# Patient Record
Sex: Male | Born: 1940 | Race: White | Hispanic: No | Smoking: Current every day smoker
Health system: Southern US, Community
[De-identification: ages and names within clinical notes are randomized; demographics above are authoritative.]

## PROBLEM LIST (undated history)

## (undated) DIAGNOSIS — I2510 Atherosclerotic heart disease of native coronary artery without angina pectoris: Secondary | ICD-10-CM

## (undated) DIAGNOSIS — I739 Peripheral vascular disease, unspecified: Secondary | ICD-10-CM

## (undated) DIAGNOSIS — I509 Heart failure, unspecified: Secondary | ICD-10-CM

## (undated) DIAGNOSIS — E785 Hyperlipidemia, unspecified: Secondary | ICD-10-CM

## (undated) DIAGNOSIS — J441 Chronic obstructive pulmonary disease with (acute) exacerbation: Secondary | ICD-10-CM

## (undated) DIAGNOSIS — F419 Anxiety disorder, unspecified: Secondary | ICD-10-CM

## (undated) DIAGNOSIS — I219 Acute myocardial infarction, unspecified: Secondary | ICD-10-CM

## (undated) DIAGNOSIS — N179 Acute kidney failure, unspecified: Secondary | ICD-10-CM

## (undated) DIAGNOSIS — D649 Anemia, unspecified: Secondary | ICD-10-CM

## (undated) DIAGNOSIS — Z720 Tobacco use: Secondary | ICD-10-CM

## (undated) DIAGNOSIS — I10 Essential (primary) hypertension: Secondary | ICD-10-CM

## (undated) DIAGNOSIS — J449 Chronic obstructive pulmonary disease, unspecified: Secondary | ICD-10-CM

## (undated) HISTORY — DX: Atherosclerotic heart disease of native coronary artery without angina pectoris: I25.10

## (undated) HISTORY — DX: Peripheral vascular disease, unspecified: I73.9

## (undated) HISTORY — PX: OTHER SURGICAL HISTORY: SHX169

## (undated) HISTORY — PX: CATARACT EXTRACTION: SUR2

## (undated) HISTORY — DX: Heart failure, unspecified: I50.9

## (undated) HISTORY — DX: Acute kidney failure, unspecified: N17.9

## (undated) HISTORY — PX: ILIAC ARTERY STENT: SHX1786

## (undated) HISTORY — PX: CHOLECYSTECTOMY: SHX55

## (undated) HISTORY — DX: Essential (primary) hypertension: I10

## (undated) HISTORY — PX: CORONARY ARTERY BYPASS GRAFT: SHX141

## (undated) HISTORY — DX: Hyperlipidemia, unspecified: E78.5

## (undated) HISTORY — DX: Acute myocardial infarction, unspecified: I21.9

## (undated) HISTORY — DX: Anxiety disorder, unspecified: F41.9

## (undated) HISTORY — DX: Anemia, unspecified: D64.9

## (undated) HISTORY — DX: Chronic obstructive pulmonary disease, unspecified: J44.9

## (undated) HISTORY — DX: Chronic obstructive pulmonary disease with (acute) exacerbation: J44.1

## (undated) HISTORY — DX: Tobacco use: Z72.0

---

## 1998-10-04 DIAGNOSIS — I219 Acute myocardial infarction, unspecified: Secondary | ICD-10-CM

## 1998-10-04 HISTORY — DX: Acute myocardial infarction, unspecified: I21.9

## 2008-10-04 HISTORY — PX: CARDIAC CATHETERIZATION: SHX172

## 2010-03-03 ENCOUNTER — Encounter: Payer: Self-pay | Admitting: Cardiovascular Disease

## 2010-03-04 HISTORY — PX: OTHER SURGICAL HISTORY: SHX169

## 2010-03-09 ENCOUNTER — Ambulatory Visit: Payer: Medicare Other | Admitting: Vascular Surgery

## 2010-03-25 ENCOUNTER — Ambulatory Visit: Payer: Medicare Other | Admitting: Pain Medicine

## 2010-06-11 ENCOUNTER — Ambulatory Visit: Payer: Medicare Other | Admitting: Internal Medicine

## 2010-06-11 ENCOUNTER — Encounter: Payer: Self-pay | Admitting: Cardiovascular Disease

## 2010-07-09 ENCOUNTER — Ambulatory Visit: Payer: Self-pay | Admitting: Cardiovascular Disease

## 2010-07-09 DIAGNOSIS — E785 Hyperlipidemia, unspecified: Secondary | ICD-10-CM | POA: Insufficient documentation

## 2010-07-09 DIAGNOSIS — R609 Edema, unspecified: Secondary | ICD-10-CM

## 2010-07-10 ENCOUNTER — Telehealth: Payer: Self-pay | Admitting: Cardiovascular Disease

## 2010-07-10 DIAGNOSIS — F17200 Nicotine dependence, unspecified, uncomplicated: Secondary | ICD-10-CM

## 2010-07-10 DIAGNOSIS — I25810 Atherosclerosis of coronary artery bypass graft(s) without angina pectoris: Secondary | ICD-10-CM | POA: Insufficient documentation

## 2010-07-10 DIAGNOSIS — I10 Essential (primary) hypertension: Secondary | ICD-10-CM | POA: Insufficient documentation

## 2010-08-20 ENCOUNTER — Telehealth: Payer: Self-pay | Admitting: Cardiovascular Disease

## 2010-11-03 NOTE — Progress Notes (Signed)
Summary: High BP  Phone Note Call from Patient Call back at [phone]   Caller: Daughter [facility]) Call For: [name] Summary of Call: BP 202/74-Pat is c/o SOB-#[phone] Initial call taken by: Harlon Flor,  August 20, 2010 3:40 PM  Follow-up for Phone Call        Called spoke with pt's daughter, she is on her way to pt's home.  He has only been taking 1/2 tablet of Metoprolol daily, advised daughter this is a two times a day, last dose was at 3am, therefore another dose should be taken to keep this medication consistantly in his system.  Pt does have hx of breathing    Additional Follow-up for Phone Call Additional follow up Details #1::        previous blood pressure has been fine. Would take meds as directed and monitor BP and call us back     Appended Document: High BP    Phone Note Call from Patient   Caller: Shaun pt's daughter Summary of Call: Daughter is calling concerned about pt's BP continuing to fluctuate-Would like for the pt to be seen today-I advised her that it was noted that the pt was only taking 1/2 tablet daily and that it should be a twice a day medication and that it could be a case of the medication not having taken effect yet-I stated that the nurse would call her back to assess the situation to determine if the pt needs to be seen today. Initial call taken by: Harlon Flor  Follow-up for Phone Call        Called spoke with pt's daughter she states pt's BP did respond to 1/2 tablet Metoprolol yesterday evening BP decr to 150/70's.  BP was 147/70 this am.  She reports pt's breathing has improved since BP has decr.  Advised to have pt continue Metoprolol 25mg  1/2 two times a day and Keep daily log of BP and HR in am and PM. Advised her TCB Monday with those readings, if consistantly elevated may need to further titrate meds. [name] agrees. Follow-up by: Cloyde Reams RN,  August 21, 2010 11:15 AM

## 2010-11-03 NOTE — Assessment & Plan Note (Signed)
Summary: NP6/AMD   Visit Type:  Initial Consult Primary Tayden Nichelson:  Dr. Darrick Huntsman  CC:  Establish care with a Cardiologist for CAD. c/o chest pain. He has a cough and shortness of breath..  History of Present Illness: 70 year old male with a history of coronary artery disease, MI in 2000 with bypass at that time, followup cardiac catheterization one to 2 years ago that was reportedly "okay" performed at [facility], diabetes, peripheral vascular disease with a history of intervention to his legs in June of 2000 less than with continued left leg pain, long history of smoking who continues to smoke one pack per day, history of GI bleeding in 2011 requiring packed red blood cell transfusion x4 per the patient, who presents for episodes of "knots" in his chest that cause pain over the past several months.  He describes his symptoms as lumps under his chest in between his ribs on the outside on the left that he needs to massage to make them go away. They seem to come and go. He is able to massage them out but they do seem to keep coming back. He has one nearer the left side of his sternum that has been there that has not gone away in quite some time. He denies any significant shortness of breath when he exerts himself. He is relatively active but limited by his chronic leg pain.  he has not been on Plavix because of a history of GI bleeding and only takes low-dose aspirin. He continues to smoke and has had difficulty trying to quit  Notes from March 09, 2010 details previous intervention to the bilateral common iliac arteries and external iliac artery with stent x2 to the left common iliac artery and external iliac artery.  EKG shows normal sinus rhythm with rate 89 beats per minute with nonspecific intraventricular conduction delay, unable to exclude old anterior infarct, left axis deviation/left anterior fascicular block. Nonspecific T waves in one and aVL  [l  Preventive Screening-Counseling &  Management  Alcohol-Tobacco     Smoking Status: never  Caffeine-Diet-Exercise     Does Patient Exercise: no  Current Medications (verified): 1)  Simvastatin 40 Mg Tabs (Simvastatin) .... Take One Tablet By Mouth Daily At Bedtime 2)  Driminate 50 Mg Tabs (Dimenhydrinate) .... One Tablet Once Daily 3)  Nexium 40 Mg Cpdr (Esomeprazole Magnesium) .... One Tablet Once Daily 4)  Furosemide 40 Mg Tabs (Furosemide) .... As Needed 5)  Gabapentin 300 Mg Caps (Gabapentin) .... One Tablet Three Times A Day 6)  Ventolin Hfa 108 (90 Base) Mcg/act Aers (Albuterol Sulfate) .... Inhalation Every 4-6 Hours As Needed 7)  Nitrostat 0.4 Mg Subl (Nitroglycerin) .... As Needed 8)  Paroxetine Hcl 30 Mg Tabs (Paroxetine Hcl) .... Two Tablets Once Daily 9)  Oxycodone-Acetaminophen 5-325 Mg Tabs (Oxycodone-Acetaminophen) .... One Tablet Every 6 Hours As Needed For Pain 10)  Aspir-Low 81 Mg Tbec (Aspirin) .... One Tablet Once Daily 11)  Ferrous Sulfate 325 (65 Fe) Mg Tabs (Ferrous Sulfate) .... One Tablet Once Daily 12)  Novolog Mix 70/30 70-30 % Susp (Insulin Aspart Prot & Aspart) 13)  Lantus 80 Units .... At Bedtime 14)  Centrum Silver  Tabs (Multiple Vitamins-Minerals) .... One Tablet Once Daily  Allergies (verified): 1)  ! Amoxicillin  Past History:  Family History: Last updated: 2010-08-08 Father: Deceased; MI Mother: Deceased; stroke  Social History: Last updated: August 08, 2010 Retired  Widowed  Tobacco Use - No. Smoker x 30 years. Smokes 1 PPD. Alcohol Use - no Regular  Exercise - no  Risk Factors: Exercise: no (07/09/2010)  Risk Factors: Smoking Status: never (07/09/2010)  Past Medical History: Hyperlipidemia Hypertension CAD PAD  Past Surgical History: CAD; CABG x 4 2000 @ [facility] PAD; stents in legs--[place] [facility] surgery  Family History: Father: Deceased; MI Mother: Deceased; stroke  Social History: Retired  Widowed  Tobacco Use - No. Smoker x 30 years.  Smokes 1 PPD. Alcohol Use - no Regular Exercise - no Smoking Status:  never Does Patient Exercise:  no  Review of Systems       The patient complains of chest pain and dyspnea on exertion.  The patient denies fever, vision loss, decreased hearing, hoarseness, syncope, peripheral edema, prolonged cough, abdominal pain, incontinence, muscle weakness, depression, enlarged lymph nodes, weight loss, and weight gain.    Vital Signs:  Patient profile:   70 year old male Height:      70 inches Weight:      175 pounds BMI:     25.20 Pulse rate:   99 / minute BP sitting:   120 / 74  (left arm) Cuff size:   regular  Vitals Entered By: Bishop Dublin, CMA (July 09, 2010 3:10 PM)  Physical Exam  General:  elderly gentleman that appears older than his stated age, in no apparent distress Head:  normocephalic and atraumatic Neck:  Neck supple, no JVD. No masses, thyromegaly or abnormal cervical nodes. Lungs:  moderately decreased breath sounds throughout bilaterally Heart:  Non-displaced PMI, chest non-tender; regular rate and rhythm, S1, S2 with I-II/VI SEM, rubs or gallops. Carotid upstroke normal, no bruit. Normal abdominal aortic size, no bruits.  Pedals normal pulses. Trace to 1+ edema, no varicosities. Abdomen:  Bowel sounds positive; abdomen soft and non-tender without masses Msk:  Back normal, normal gait. Muscle strength and tone normal. Pulses:  pulses normal in all 4 extremities Extremities:  No clubbing or cyanosis. Neurologic:  Alert and oriented x 3. Skin:  Intact without lesions or rashes. Psych:  Normal affect.   Impression & Recommendations:  Problem # 1:  CAD, AUTOLOGOUS BYPASS GRAFT (ICD-414.02) he has a history of coronary artery disease and bypass. His current symptoms seem somewhat atypical as he is able to massage the "knots" out of his left chest and they come on at rest and not with exertion.  No further testing is needed at this time given that his symptoms are  very atypical.  The following medications were removed from the medication list:    Plavix 75 Mg Tabs (Clopidogrel bisulfate) ..... One tablet once daily His updated medication list for this problem includes:    Nitrostat 0.4 Mg Subl (Nitroglycerin) .Marland Kitchen... As needed    Aspir-low 81 Mg Tbec (Aspirin) ..... One tablet once daily  Problem # 2:  TOBACCO ABUSE (ICD-305.1) We have strongly encouraged him to cut back on his smoking. He is not interested in any medical therapies for smoking cessation at this time.  Problem # 3:  HYPERTENSION, BENIGN (ICD-401.1) blood pressure appears well controlled at this time.  His updated medication list for this problem includes:    Furosemide 40 Mg Tabs (Furosemide) .Marland Kitchen... As needed    Aspir-low 81 Mg Tbec (Aspirin) ..... One tablet once daily  Problem # 4:  EDEMA (ICD-782.3) He does take Lasix daily for edema and for shortness of breath. I suggested that if he has worsening shortness of breath or worsening swelling in his ankles, he could take an extra Lasix in the afternoon.  Problem # 5:  HYPERLIPIDEMIA-MIXED (ICD-272.4) We will try to obtain his most recent lipid panel for our records.  His updated medication list for this problem includes:    Simvastatin 40 Mg Tabs (Simvastatin) .Marland Kitchen... Take one tablet by mouth daily at bedtime

## 2010-11-03 NOTE — Progress Notes (Signed)
Summary: heart rate  Phone Note Other Incoming   Caller: Patrick [name] Summary of Call: Patrick [name] has a heart rate of 120; yesterday was told to take  Metoprolol Tart 25 mg 1/2 tablet bid and is concerned. What to do about the heart rate being so high? His heart rate now is 103 and patient has not taken metoprolol yet.  The daughter will call the pharmacy today to see if  can get filled. Initial call taken by: Bishop Dublin, CMA,  July 10, 2010 2:46 PM  Follow-up for Phone Call        Told patient's daughter to be sure he is not having any symptoms with increased heart rate and to go ahead and start the metoprolol tart. 25 mg 1/2  two times a day as instructed yesterday by Dr. [name]. Follow-up by: Bishop Dublin, CMA,  July 10, 2010 2:54 PM    New/Updated Medications: METOPROLOL TARTRATE 25 MG TABS (METOPROLOL TARTRATE) Take 1/2  tablet by mouth two times a day Prescriptions: METOPROLOL TARTRATE 25 MG TABS (METOPROLOL TARTRATE) Take 1/2  tablet by mouth two times a day  #30 x 6   Entered by:   Bishop Dublin, CMA   Authorized by:   Patrick Arbour MD   Signed by:   Bishop Dublin, CMA on 07/10/2010   Method used:   Faxed to ...       [facility] (retail)       [address]       [place], [place]    [place]       Ph: [phone]       Fax: [phone]   RxID:   [id number]   Appended Document: heart rate If he has additional episodes of tachycardia, call us. May need a holter or event monitor. ventolin inhaler could also cause tachycardia...  Appended Document: heart rate Spoke to pt's daughter since starting metoprolol tachycardia has improved. BP 114/48 HR 71. Asked daughter to continue to monitor those BP's and HR's if he has anymore tachycardia to call office.

## 2010-11-13 ENCOUNTER — Ambulatory Visit: Payer: Medicare Other | Admitting: Internal Medicine

## 2011-01-11 ENCOUNTER — Ambulatory Visit: Payer: Medicare Other | Admitting: Vascular Surgery

## 2011-02-01 ENCOUNTER — Other Ambulatory Visit: Payer: Self-pay | Admitting: Emergency Medicine

## 2011-02-01 MED ORDER — METOPROLOL TARTRATE 25 MG PO TABS: ORAL_TABLET | ORAL | Status: DC

## 2011-03-18 ENCOUNTER — Ambulatory Visit: Payer: Self-pay | Admitting: Cardiovascular Disease

## 2011-05-28 ENCOUNTER — Other Ambulatory Visit: Payer: Self-pay | Admitting: Internal Medicine

## 2011-05-28 DIAGNOSIS — N529 Male erectile dysfunction, unspecified: Secondary | ICD-10-CM

## 2011-06-08 ENCOUNTER — Other Ambulatory Visit: Payer: Self-pay | Admitting: Internal Medicine

## 2011-06-08 DIAGNOSIS — F331 Major depressive disorder, recurrent, moderate: Secondary | ICD-10-CM

## 2011-06-28 ENCOUNTER — Telehealth: Payer: Self-pay | Admitting: Internal Medicine

## 2011-06-28 MED ORDER — OXYCODONE HCL 60 MG PO TB12: 1.0000 | ORAL_TABLET | Freq: Two times a day (BID) | ORAL | Status: DC | PRN

## 2011-06-28 MED ORDER — OXYCODONE-ACETAMINOPHEN 5-325 MG PO TABS: 1.0000 | ORAL_TABLET | Freq: Four times a day (QID) | ORAL | Status: DC | PRN

## 2011-06-28 NOTE — Telephone Encounter (Signed)
Patient received last refill on both medications on 05/31/11

## 2011-06-29 NOTE — Telephone Encounter (Signed)
Notified patients daughter Rx is ready to be picked up.

## 2011-07-30 ENCOUNTER — Encounter: Payer: Self-pay | Admitting: Internal Medicine

## 2011-07-30 ENCOUNTER — Ambulatory Visit (INDEPENDENT_AMBULATORY_CARE_PROVIDER_SITE_OTHER): Payer: Medicare Other | Admitting: Internal Medicine

## 2011-07-30 VITALS — BP 120/52 | HR 62 | Temp 97.4°F | Resp 16 | Ht 70.0 in | Wt 174.2 lb

## 2011-07-30 DIAGNOSIS — I10 Essential (primary) hypertension: Secondary | ICD-10-CM

## 2011-07-30 DIAGNOSIS — L0390 Cellulitis, unspecified: Secondary | ICD-10-CM

## 2011-07-30 DIAGNOSIS — F17200 Nicotine dependence, unspecified, uncomplicated: Secondary | ICD-10-CM

## 2011-07-30 DIAGNOSIS — E119 Type 2 diabetes mellitus without complications: Secondary | ICD-10-CM

## 2011-07-30 DIAGNOSIS — M25519 Pain in unspecified shoulder: Secondary | ICD-10-CM

## 2011-07-30 DIAGNOSIS — M25512 Pain in left shoulder: Secondary | ICD-10-CM

## 2011-07-30 DIAGNOSIS — E785 Hyperlipidemia, unspecified: Secondary | ICD-10-CM

## 2011-07-30 DIAGNOSIS — L0291 Cutaneous abscess, unspecified: Secondary | ICD-10-CM

## 2011-07-30 MED ORDER — OXYCODONE HCL 60 MG PO TB12: 1.0000 | ORAL_TABLET | Freq: Two times a day (BID) | ORAL | Status: DC | PRN

## 2011-07-30 MED ORDER — OXYCODONE-ACETAMINOPHEN 5-325 MG PO TABS: 1.0000 | ORAL_TABLET | Freq: Four times a day (QID) | ORAL | Status: DC | PRN

## 2011-07-30 MED ORDER — CEPHALEXIN 500 MG PO CAPS: 500.0000 mg | ORAL_CAPSULE | Freq: Three times a day (TID) | ORAL | Status: AC

## 2011-07-30 NOTE — Patient Instructions (Signed)
You have an appt with dr Hyacinth Meeker at [facility] on Monday at 11L00 to look at your shoulder  Ice the shoulder for 15 minutes every 4 or 5 hours  Wear the sling as much as possible.    Return on Monday for fasting bloodwork.
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
## 2011-08-01 ENCOUNTER — Encounter: Payer: Self-pay | Admitting: Internal Medicine

## 2011-08-01 DIAGNOSIS — F419 Anxiety disorder, unspecified: Secondary | ICD-10-CM | POA: Insufficient documentation

## 2011-08-01 DIAGNOSIS — M25512 Pain in left shoulder: Secondary | ICD-10-CM | POA: Insufficient documentation

## 2011-08-01 DIAGNOSIS — Z720 Tobacco use: Secondary | ICD-10-CM | POA: Insufficient documentation

## 2011-08-01 DIAGNOSIS — E1159 Type 2 diabetes mellitus with other circulatory complications: Secondary | ICD-10-CM | POA: Insufficient documentation

## 2011-08-01 DIAGNOSIS — I739 Peripheral vascular disease, unspecified: Secondary | ICD-10-CM | POA: Insufficient documentation

## 2011-08-01 NOTE — Assessment & Plan Note (Signed)
He continues to use 70/30 insulin twice daily but his sugars are not well controlled by report.  Will check hgba1c and ask patient (again) to submit biweekly logs of bs.

## 2011-08-01 NOTE — Assessment & Plan Note (Signed)
Well controlled on current medications.  No changes today. 

## 2011-08-01 NOTE — Assessment & Plan Note (Signed)
Acute, secondary to recent fall,  With painful ROM despite regular use of narcotics. I have put his arm in a sling and made an appt with [facility] to see Dr. Hyacinth Meeker on Monday for evaluation.  He is not a candidate for NSAIDs due to prior history of GI bleed.

## 2011-08-01 NOTE — Assessment & Plan Note (Signed)
Managed with simvastatin . Repeat lipids are  due  Goal is < 70.

## 2011-08-01 NOTE — Assessment & Plan Note (Signed)
He has been counselled on the risks of continued tobacco abuse given his history of CAD and PAD.  He is not interested in quitting at this time.

## 2011-08-05 ENCOUNTER — Other Ambulatory Visit: Payer: Self-pay | Admitting: Internal Medicine

## 2011-08-19 ENCOUNTER — Other Ambulatory Visit: Payer: Self-pay | Admitting: Internal Medicine

## 2011-08-26 ENCOUNTER — Other Ambulatory Visit: Payer: Self-pay | Admitting: Internal Medicine

## 2011-08-26 ENCOUNTER — Other Ambulatory Visit: Payer: Self-pay | Admitting: Cardiovascular Disease

## 2011-08-27 ENCOUNTER — Telehealth: Payer: Self-pay

## 2011-08-27 MED ORDER — METOPROLOL TARTRATE 25 MG PO TABS: ORAL_TABLET | ORAL | Status: DC

## 2011-08-27 NOTE — Telephone Encounter (Signed)
Refill sent for metoprolol.  

## 2011-10-05 HISTORY — PX: CATARACT EXTRACTION, BILATERAL: SHX1313

## 2011-10-15 ENCOUNTER — Other Ambulatory Visit: Payer: Self-pay | Admitting: Internal Medicine

## 2011-10-15 MED ORDER — OXYCODONE HCL 60 MG PO TB12: 1.0000 | ORAL_TABLET | Freq: Two times a day (BID) | ORAL | Status: DC | PRN

## 2011-10-15 MED ORDER — OXYCODONE-ACETAMINOPHEN 5-325 MG PO TABS: 1.0000 | ORAL_TABLET | Freq: Four times a day (QID) | ORAL | Status: DC | PRN

## 2011-10-15 NOTE — Telephone Encounter (Signed)
On printer. Can pick up Monday

## 2011-10-15 NOTE — Telephone Encounter (Signed)
Patient is needing refills on his Oxyontin 60 mg and Oxycodone 5-325 mg.

## 2011-10-18 NOTE — Telephone Encounter (Signed)
Left message that script is ready for pick up

## 2011-11-04 ENCOUNTER — Telehealth: Payer: Self-pay | Admitting: Internal Medicine

## 2011-11-04 MED ORDER — NITROGLYCERIN 0.4 MG SL SUBL: 0.4000 mg | SUBLINGUAL_TABLET | SUBLINGUAL | Status: DC | PRN

## 2011-11-04 NOTE — Telephone Encounter (Signed)
Refill done,. If he gets to the point where is is needing to use more than one to resolve pain, he needs to call 911

## 2011-11-04 NOTE — Telephone Encounter (Signed)
Spoke with pt's daughter, she said pt had some chest discomfort this morning but he has been ok since.  She is asking that he get a refill on ntg, previously prescribed by his cardiologist.  Uses [facility] [address]

## 2011-11-04 NOTE — Telephone Encounter (Signed)
[phone] Mr Patrick [name] called wanted to get a refill on nitroglycen pt is out of meds  Pt was having discomfort today [facility] [address]

## 2011-11-05 ENCOUNTER — Telehealth: Payer: Self-pay | Admitting: *Deleted

## 2011-11-05 NOTE — Telephone Encounter (Signed)
Advised pt

## 2011-11-05 NOTE — Telephone Encounter (Signed)
Form from [facility] for diabetic supplies is in red folder. I checked with the patient and he does want these supplies.

## 2011-11-08 NOTE — Telephone Encounter (Signed)
Form completed and faxed back

## 2011-11-16 ENCOUNTER — Encounter: Payer: Self-pay | Admitting: Internal Medicine

## 2011-11-16 ENCOUNTER — Ambulatory Visit (INDEPENDENT_AMBULATORY_CARE_PROVIDER_SITE_OTHER): Payer: Medicare Other | Admitting: Internal Medicine

## 2011-11-16 ENCOUNTER — Ambulatory Visit (INDEPENDENT_AMBULATORY_CARE_PROVIDER_SITE_OTHER)
Admission: RE | Admit: 2011-11-16 | Discharge: 2011-11-16 | Disposition: A | Discharge status: Home / Self Care | Payer: Medicare Other | Source: Ambulatory Visit | Attending: Internal Medicine | Admitting: Internal Medicine

## 2011-11-16 VITALS — BP 122/50 | HR 72 | Temp 98.2°F | Wt 172.0 lb

## 2011-11-16 DIAGNOSIS — E118 Type 2 diabetes mellitus with unspecified complications: Secondary | ICD-10-CM

## 2011-11-16 DIAGNOSIS — E1165 Type 2 diabetes mellitus with hyperglycemia: Secondary | ICD-10-CM

## 2011-11-16 DIAGNOSIS — M25559 Pain in unspecified hip: Secondary | ICD-10-CM

## 2011-11-16 DIAGNOSIS — L0291 Cutaneous abscess, unspecified: Secondary | ICD-10-CM

## 2011-11-16 DIAGNOSIS — R220 Localized swelling, mass and lump, head: Secondary | ICD-10-CM

## 2011-11-16 DIAGNOSIS — G9389 Other specified disorders of brain: Secondary | ICD-10-CM

## 2011-11-16 DIAGNOSIS — IMO0002 Reserved for concepts with insufficient information to code with codable children: Secondary | ICD-10-CM

## 2011-11-16 DIAGNOSIS — M25552 Pain in left hip: Secondary | ICD-10-CM

## 2011-11-16 DIAGNOSIS — E119 Type 2 diabetes mellitus without complications: Secondary | ICD-10-CM

## 2011-11-16 DIAGNOSIS — E785 Hyperlipidemia, unspecified: Secondary | ICD-10-CM

## 2011-11-16 DIAGNOSIS — L0390 Cellulitis, unspecified: Secondary | ICD-10-CM

## 2011-11-16 MED ORDER — OXYCODONE HCL 60 MG PO TB12: 1.0000 | ORAL_TABLET | Freq: Two times a day (BID) | ORAL | Status: DC | PRN

## 2011-11-16 MED ORDER — OXYCODONE-ACETAMINOPHEN 5-325 MG PO TABS: 1.0000 | ORAL_TABLET | Freq: Four times a day (QID) | ORAL | Status: DC | PRN

## 2011-11-16 NOTE — Progress Notes (Signed)
Subjective:    Patient ID: Patrick [name], male    DOB: 1941-02-15, 71 y.o.   MRN: [id number]  HPI  Patrick [name] is a 71 year old white male with a history of diabetes mellitus uncontrolled peripheral vascular disease with prior vascular interventions chronic neck and back pain and ongoing tobacco abuse who presents with several complaints today. He had  an episode of chest pain which lasted 2-3 minutes ,  Occurred while making coffee,  resolved spontaneously, with no subsequent episodes.  His symptomsdDid not last long enough to take ntg but his prescription expired anyway. He is scheduled to see his cardiologist Dr. [name] in one week.  His main complaint today is the development of severe hip pain radiating to the knee on the left which has been present for 3-4 weeks he has no history of prior fall or unusual activities. The pain is apparently constant even on while supine but is made worse by weightbearing .  He states his blood sugars have been consistently improved with most of them under 200. However he did not bring his log of blood sugars with him. He is overdue for an A1c.  Past Medical History  Diagnosis Date  . Hypertension   . Hyperlipidemia   . Coronary artery disease   . PAD (peripheral artery disease)   . Diabetes mellitus   . Anxiety   . Tobacco abuse   . Peripheral vascular disease     with prior stents placed bilaterally   Current Outpatient Prescriptions on File Prior to Visit  Medication Sig Dispense Refill  . dimenhyDRINATE (DRAMAMINE) 50 MG tablet Take 50 mg by mouth every 8 (eight) hours as needed.        Marland Kitchen esomeprazole (NEXIUM) 40 MG capsule Take 40 mg by mouth daily before breakfast.        . ferrous sulfate 325 (65 FE) MG EC tablet Take 325 mg by mouth daily with breakfast.        . furosemide (LASIX) 40 MG tablet TAKE 1 TABLET BY MOUTH EVERY DAY AS NEEDED  30 tablet  3  . gabapentin (NEURONTIN) 300 MG capsule Take 300 mg by mouth 3 (three) times daily.        . insulin  aspart protamine-insulin aspart (NOVOLOG 70/30) (70-30) 100 UNIT/ML injection Inject into the skin as directed.        . insulin glargine (LANTUS) 100 UNIT/ML injection Inject 40 Units into the skin at bedtime.       . metoprolol tartrate (LOPRESSOR) 25 MG tablet Take 1/2 tablet by mouth twice daily  30 tablet  6  . Multiple Vitamin (MULTIVITAMIN) tablet Take 1 tablet by mouth daily.        . nitroGLYCERIN (NITROSTAT) 0.4 MG SL tablet Place 1 tablet (0.4 mg total) under the tongue every 5 (five) minutes as needed.  30 tablet  6  . potassium chloride SA (K-DUR,KLOR-CON) 20 MEQ tablet TAKE 1 TABLET BY MOUTH EVERY DAY AS NEEDED  30 tablet  2  . PROAIR HFA 108 (90 BASE) MCG/ACT inhaler INHALE 1-2 PUFFS BY MOUTH EVERY 4-6 HOURS AS NEEDED  18 g  3  . simvastatin (ZOCOR) 40 MG tablet Take 40 mg by mouth at bedtime.        Marland Kitchen PARoxetine (PAXIL) 30 MG tablet TAKE 2 TABLETS BY MOUTH EVERY DAY  60 tablet  5     Review of Systems  Constitutional: Negative for fever, chills, diaphoresis, activity change, appetite change, fatigue and unexpected weight  change.  HENT: Negative for hearing loss, ear pain, nosebleeds, congestion, sore throat, facial swelling, rhinorrhea, sneezing, drooling, mouth sores, trouble swallowing, neck pain, neck stiffness, dental problem, voice change, postnasal drip, sinus pressure, tinnitus and ear discharge.   Eyes: Negative for photophobia, pain, discharge, redness, itching and visual disturbance.  Respiratory: Negative for apnea, cough, choking, chest tightness, shortness of breath, wheezing and stridor.   Cardiovascular: Negative for chest pain, palpitations and leg swelling.  Gastrointestinal: Negative for nausea, vomiting, abdominal pain, diarrhea, constipation, blood in stool, abdominal distention, anal bleeding and rectal pain.  Genitourinary: Negative for dysuria, urgency, frequency, hematuria, flank pain, decreased urine volume, scrotal swelling, difficulty urinating and  testicular pain.  Musculoskeletal: Positive for back pain, arthralgias and gait problem. Negative for myalgias and joint swelling.  Skin: Negative for color change, rash and wound.  Neurological: Negative for dizziness, tremors, seizures, syncope, speech difficulty, weakness, light-headedness, numbness and headaches.  Psychiatric/Behavioral: Negative for suicidal ideas, hallucinations, behavioral problems, confusion, sleep disturbance, dysphoric mood, decreased concentration and agitation. The patient is not nervous/anxious.        Objective:   Physical Exam  Constitutional: He is oriented to person, place, and time.  HENT:  Head: Normocephalic and atraumatic.  Mouth/Throat: Oropharynx is clear and moist.  Eyes: Conjunctivae and EOM are normal.  Neck: Normal range of motion. Neck supple. No JVD present. No thyromegaly present.  Cardiovascular: Normal rate, regular rhythm and normal heart sounds.   Pulmonary/Chest: Effort normal and breath sounds normal. He has no wheezes. He has no rales.  Abdominal: Soft. Bowel sounds are normal. He exhibits no mass. There is no tenderness. There is no rebound.  Musculoskeletal: Normal range of motion. He exhibits no edema.  Neurological: He is alert and oriented to person, place, and time.  Skin: Skin is warm and dry.  Psychiatric: He has a normal mood and affect.          Assessment & Plan:

## 2011-11-17 ENCOUNTER — Encounter: Payer: Self-pay | Admitting: Internal Medicine

## 2011-11-17 LAB — CBC WITH DIFFERENTIAL/PLATELET
Basophils Absolute: 0 10*3/uL (ref 0.0–0.1)
Basophils Relative: 0.5 % (ref 0.0–3.0)
Eosinophils Absolute: 0.3 10*3/uL (ref 0.0–0.7)
Hemoglobin: 9.7 g/dL — ABNORMAL LOW (ref 13.0–17.0)
Lymphocytes Relative: 26.1 % (ref 12.0–46.0)
MCHC: 31.8 g/dL (ref 30.0–36.0)
Monocytes Relative: 5.6 % (ref 3.0–12.0)
Neutro Abs: 5.8 10*3/uL (ref 1.4–7.7)
Neutrophils Relative %: 64.7 % (ref 43.0–77.0)
RBC: 3.75 Mil/uL — ABNORMAL LOW (ref 4.22–5.81)
RDW: 16.6 % — ABNORMAL HIGH (ref 11.5–14.6)

## 2011-11-17 LAB — COMPREHENSIVE METABOLIC PANEL
AST: 14 U/L (ref 0–37)
Albumin: 3.4 g/dL — ABNORMAL LOW (ref 3.5–5.2)
BUN: 36 mg/dL — ABNORMAL HIGH (ref 6–23)
CO2: 27 mEq/L (ref 19–32)
Calcium: 8.5 mg/dL (ref 8.4–10.5)
Chloride: 107 mEq/L (ref 96–112)
Creatinine, Ser: 1.5 mg/dL (ref 0.4–1.5)
GFR: 48.62 mL/min — ABNORMAL LOW (ref >60.00)
Potassium: 4.8 mEq/L (ref 3.5–5.1)

## 2011-11-17 LAB — HEMOGLOBIN A1C: Hgb A1c MFr Bld: 9.4 % — ABNORMAL HIGH (ref 4.6–6.5)

## 2011-11-17 LAB — LIPID PANEL
Cholesterol: 108 mg/dL (ref 0–200)
Triglycerides: 73 mg/dL (ref 0.0–149.0)

## 2011-11-17 NOTE — Assessment & Plan Note (Signed)
Found on MRI of brain done Feb 2012 for investigation of left arm numbness.  Patient was referred to [facility] for evaluation but declined to go.

## 2011-11-18 ENCOUNTER — Telehealth: Payer: Self-pay | Admitting: Internal Medicine

## 2011-11-18 DIAGNOSIS — M25552 Pain in left hip: Secondary | ICD-10-CM | POA: Insufficient documentation

## 2011-11-18 NOTE — Patient Instructions (Signed)
I am sendigg your for hip films to rule out loss of the hip joint.  We may need to get an MRI for your lower back.   Please submit a log of your blood sugars every couple of weeks along with your  Food diary.

## 2011-11-18 NOTE — Assessment & Plan Note (Signed)
His hip films were negative for loss of joint space or avascular necrosis. Etiology  may be lower back with radiculopathy to leg versus peripheral vascular disease worsening.  He states he had an MRI of the lumbar spine about 6 months ago but I am unable to find these records. He has good pulses in both feet and the wall and the leg appears well perfused sewed iliac stenosis is less likely. He would be considered high risk for lumbar surgery given his poorly controlled diabetes ongoing tobacco use and peripheral vascular disease her greatest pain is unrelenting and not improving with orthotics he may need to have intervention. Will obtain his prior MRI if this was done and referred to a vascular and neurosurgery.

## 2011-11-18 NOTE — Telephone Encounter (Signed)
Please review and advise.

## 2011-11-18 NOTE — Assessment & Plan Note (Addendum)
He has known peripheral vascular disease with prior interventions, and his hemoglobin A1c today is 9.4  Despite use of 70/30 insulin twice daily and Lantus for basal insulin is a very difficult historian and has been resistant to following a diabetic diet as well as to bring in log of his blood sugars. I  Am frustrated by mu inability to engender in him any real ownership of his disease.  I am referring him to Endocrinology to see if they can make any improvement in his glucose control.

## 2011-11-18 NOTE — Telephone Encounter (Signed)
[phone] PT DAUGHTER CALLED CHECKING ON XRAY THAT WAS DONE AT [facility] ON 11/16/11

## 2011-11-18 NOTE — Assessment & Plan Note (Signed)
His LDL and triglycerides are currently at goal on current statin dose. No changes today

## 2011-11-19 ENCOUNTER — Telehealth: Payer: Self-pay | Admitting: *Deleted

## 2011-11-19 DIAGNOSIS — M25559 Pain in unspecified hip: Secondary | ICD-10-CM

## 2011-11-19 NOTE — Telephone Encounter (Signed)
Advised pt and his daughter, Ines Bloomer, of pt's lab results and x-ray report. She says pt's last MRI was about a year ago.  She is asking if pt's hip pain could be caused by poor blood flow.  Also, he agrees to an endocrinologist referral.

## 2011-11-19 NOTE — Telephone Encounter (Signed)
Yes,  To blood flow. He has never had  a lumbar spine MRI done at [facility].  They need to tell me who ordered it and where it was done.  I will refer him to Vascular surgery for repeat evaluation

## 2011-11-19 NOTE — Telephone Encounter (Signed)
We have alrady contacted patient.  Hip filsm were fine.  Pain may be coming from lower back or from his circulation,.  He is supposed to be calling us back to tell us who and were he had an MRI ordered 6 months ago  beuse I don't have it.

## 2011-11-22 ENCOUNTER — Inpatient Hospital Stay: Payer: Self-pay | Admitting: Internal Medicine

## 2011-11-22 DIAGNOSIS — I059 Rheumatic mitral valve disease, unspecified: Secondary | ICD-10-CM

## 2011-11-22 LAB — COMPREHENSIVE METABOLIC PANEL
Albumin: 3.4 g/dL (ref 3.4–5.0)
Alkaline Phosphatase: 105 U/L (ref 50–136)
Anion Gap: 6 — ABNORMAL LOW (ref 7–16)
Bilirubin,Total: 0.3 mg/dL (ref 0.2–1.0)
Calcium, Total: 8.5 mg/dL (ref 8.5–10.1)
Co2: 27 mmol/L (ref 21–32)
EGFR (African American): 50 — ABNORMAL LOW
EGFR (Non-African Amer.): 41 — ABNORMAL LOW
Glucose: 206 mg/dL — ABNORMAL HIGH (ref 65–99)
Potassium: 4.1 mmol/L (ref 3.5–5.1)
SGOT(AST): 13 U/L — ABNORMAL LOW (ref 15–37)

## 2011-11-22 LAB — TROPONIN I: Troponin-I: <0.02 ng/mL

## 2011-11-22 LAB — CBC
MCH: 26 pg (ref 26.0–34.0)
MCV: 81 fL (ref 80–100)
Platelet: 316 10*3/uL (ref 150–440)
RDW: 16.7 % — ABNORMAL HIGH (ref 11.5–14.5)
WBC: 7 10*3/uL (ref 3.8–10.6)

## 2011-11-23 DIAGNOSIS — I5020 Unspecified systolic (congestive) heart failure: Secondary | ICD-10-CM

## 2011-11-23 LAB — LIPID PANEL
Cholesterol: 117 mg/dL (ref 0–200)
HDL Cholesterol: 29 mg/dL — ABNORMAL LOW (ref 40–60)
Triglycerides: 64 mg/dL (ref 0–200)
VLDL Cholesterol, Calc: 13 mg/dL (ref 5–40)

## 2011-11-23 LAB — BASIC METABOLIC PANEL
Anion Gap: 7 (ref 7–16)
Chloride: 105 mmol/L (ref 98–107)
Co2: 26 mmol/L (ref 21–32)
EGFR (African American): 57 — ABNORMAL LOW
EGFR (Non-African Amer.): 47 — ABNORMAL LOW
Osmolality: 294 (ref 275–301)
Potassium: 4.8 mmol/L (ref 3.5–5.1)
Sodium: 138 mmol/L (ref 136–145)

## 2011-11-23 LAB — CBC WITH DIFFERENTIAL/PLATELET
Basophil %: 0.2 %
Eosinophil #: 0 10*3/uL (ref 0.0–0.7)
Eosinophil %: 0 %
HCT: 29.4 % — ABNORMAL LOW (ref 40.0–52.0)
Lymphocyte #: 0.7 10*3/uL — ABNORMAL LOW (ref 1.0–3.6)
MCHC: 31.9 g/dL — ABNORMAL LOW (ref 32.0–36.0)
Monocyte %: 0.8 %
Neutrophil #: 5.5 10*3/uL (ref 1.4–6.5)
Neutrophil %: 87.6 %
Platelet: 287 10*3/uL (ref 150–440)
RBC: 3.61 10*6/uL — ABNORMAL LOW (ref 4.40–5.90)
RDW: 17.3 % — ABNORMAL HIGH (ref 11.5–14.5)
WBC: 6.2 10*3/uL (ref 3.8–10.6)

## 2011-11-23 LAB — RETICULOCYTES
Absolute Retic Count: 0.092 10*6/uL — ABNORMAL HIGH (ref 0.024–0.084)
Reticulocyte: 2.5 % — ABNORMAL HIGH (ref 0.5–1.5)

## 2011-11-23 LAB — IRON AND TIBC
Iron: 28 ug/dL — ABNORMAL LOW (ref 65–175)
Unbound Iron-Bind.Cap.: 310 ug/dL

## 2011-11-23 LAB — FERRITIN: Ferritin (ARMC): 38 ng/mL (ref 8–388)

## 2011-11-24 DIAGNOSIS — J9600 Acute respiratory failure, unspecified whether with hypoxia or hypercapnia: Secondary | ICD-10-CM

## 2011-11-24 LAB — BASIC METABOLIC PANEL
Anion Gap: 11 (ref 7–16)
Calcium, Total: 8.6 mg/dL (ref 8.5–10.1)
Co2: 25 mmol/L (ref 21–32)
EGFR (African American): 57 — ABNORMAL LOW
Osmolality: 297 (ref 275–301)

## 2011-11-24 NOTE — Telephone Encounter (Signed)
Spoke with patients daughter, pt is in hospital now but does agree to evaluation by vascular surgeon and endocrinologist.  She will call back when things settle down for referrals.

## 2011-11-25 ENCOUNTER — Other Ambulatory Visit: Payer: Self-pay | Admitting: Internal Medicine

## 2011-11-25 LAB — BASIC METABOLIC PANEL
Anion Gap: 7 (ref 7–16)
BUN: 62 mg/dL — ABNORMAL HIGH (ref 7–18)
Calcium, Total: 8.8 mg/dL (ref 8.5–10.1)
Chloride: 100 mmol/L (ref 98–107)
Co2: 29 mmol/L (ref 21–32)
Creatinine: 1.73 mg/dL — ABNORMAL HIGH (ref 0.60–1.30)
EGFR (African American): 51 — ABNORMAL LOW
Glucose: 361 mg/dL — ABNORMAL HIGH (ref 65–99)

## 2011-11-25 LAB — OCCULT BLOOD X 1 CARD TO LAB, STOOL: Occult Blood, Feces: POSITIVE

## 2011-11-26 ENCOUNTER — Other Ambulatory Visit: Payer: Medicare Other

## 2011-11-26 LAB — HEMOGLOBIN: HGB: 9.2 g/dL — ABNORMAL LOW (ref 13.0–18.0)

## 2011-11-26 LAB — BASIC METABOLIC PANEL
Anion Gap: 11 (ref 7–16)
BUN: 70 mg/dL — ABNORMAL HIGH (ref 7–18)
Calcium, Total: 8.3 mg/dL — ABNORMAL LOW (ref 8.5–10.1)
Co2: 28 mmol/L (ref 21–32)
Creatinine: 2.33 mg/dL — ABNORMAL HIGH (ref 0.60–1.30)
EGFR (African American): 36 — ABNORMAL LOW
EGFR (Non-African Amer.): 30 — ABNORMAL LOW
Glucose: 124 mg/dL — ABNORMAL HIGH (ref 65–99)
Osmolality: 307 (ref 275–301)
Sodium: 143 mmol/L (ref 136–145)

## 2011-11-27 LAB — BASIC METABOLIC PANEL
Anion Gap: 7 (ref 7–16)
BUN: 63 mg/dL — ABNORMAL HIGH (ref 7–18)
Calcium, Total: 8.4 mg/dL — ABNORMAL LOW (ref 8.5–10.1)
Chloride: 106 mmol/L (ref 98–107)
Co2: 29 mmol/L (ref 21–32)
Creatinine: 1.86 mg/dL — ABNORMAL HIGH (ref 0.60–1.30)
EGFR (African American): 47 — ABNORMAL LOW
EGFR (Non-African Amer.): 39 — ABNORMAL LOW
Glucose: 113 mg/dL — ABNORMAL HIGH (ref 65–99)
Osmolality: 302 (ref 275–301)
Potassium: 4.5 mmol/L (ref 3.5–5.1)
Sodium: 142 mmol/L (ref 136–145)

## 2011-11-27 LAB — CBC WITH DIFFERENTIAL/PLATELET
Basophil #: 0 10*3/uL (ref 0.0–0.1)
Basophil %: 0.1 %
Eosinophil #: 0.1 10*3/uL (ref 0.0–0.7)
Eosinophil %: 1.2 %
HCT: 27.4 % — ABNORMAL LOW (ref 40.0–52.0)
HGB: 8.7 g/dL — ABNORMAL LOW (ref 13.0–18.0)
Lymphocyte #: 1.5 10*3/uL (ref 1.0–3.6)
Lymphocyte %: 19.7 %
MCH: 25.8 pg — ABNORMAL LOW (ref 26.0–34.0)
MCHC: 31.7 g/dL — ABNORMAL LOW (ref 32.0–36.0)
MCV: 82 fL (ref 80–100)
Monocyte #: 0.6 10*3/uL (ref 0.0–0.7)
Monocyte %: 7.6 %
Neutrophil #: 5.5 10*3/uL (ref 1.4–6.5)
Neutrophil %: 71.4 %
Platelet: 214 10*3/uL (ref 150–440)
RBC: 3.36 10*6/uL — ABNORMAL LOW (ref 4.40–5.90)
RDW: 17.5 % — ABNORMAL HIGH (ref 11.5–14.5)
WBC: 7.7 10*3/uL (ref 3.8–10.6)

## 2011-11-28 LAB — BASIC METABOLIC PANEL
Anion Gap: 10 (ref 7–16)
Chloride: 108 mmol/L — ABNORMAL HIGH (ref 98–107)
Creatinine: 1.47 mg/dL — ABNORMAL HIGH (ref 0.60–1.30)
EGFR (African American): >60
EGFR (Non-African Amer.): 51 — ABNORMAL LOW
Glucose: 148 mg/dL — ABNORMAL HIGH (ref 65–99)
Osmolality: 308 (ref 275–301)
Potassium: 4.1 mmol/L (ref 3.5–5.1)
Sodium: 147 mmol/L — ABNORMAL HIGH (ref 136–145)

## 2011-11-28 LAB — HEMOGLOBIN
HGB: 8.2 g/dL — ABNORMAL LOW (ref 13.0–18.0)
HGB: 9.1 g/dL — ABNORMAL LOW (ref 13.0–18.0)

## 2011-11-28 LAB — MAGNESIUM: Magnesium: 2 mg/dL

## 2011-11-29 ENCOUNTER — Telehealth: Payer: Self-pay | Admitting: Internal Medicine

## 2011-11-29 DIAGNOSIS — Z720 Tobacco use: Secondary | ICD-10-CM

## 2011-11-29 LAB — BASIC METABOLIC PANEL
Anion Gap: 8 (ref 7–16)
BUN: 37 mg/dL — ABNORMAL HIGH (ref 7–18)
Calcium, Total: 8.3 mg/dL — ABNORMAL LOW (ref 8.5–10.1)
Chloride: 108 mmol/L — ABNORMAL HIGH (ref 98–107)
Co2: 29 mmol/L (ref 21–32)
EGFR (African American): >60
Osmolality: 299 (ref 275–301)

## 2011-11-29 LAB — HEMOGLOBIN: HGB: 8.6 g/dL — ABNORMAL LOW (ref 13.0–18.0)

## 2011-11-29 MED ORDER — NICOTINE 21 MG/24HR TD PT24: 1.0000 | MEDICATED_PATCH | TRANSDERMAL | Status: DC

## 2011-11-29 NOTE — Telephone Encounter (Signed)
[phone] shawn called pt was discharged today and the hospital dr didn't give him rx for nictone patch [facility] [address]  Can you call this in

## 2011-11-29 NOTE — Telephone Encounter (Signed)
Sent 21 mcg patch to [facility]

## 2011-11-30 NOTE — Telephone Encounter (Signed)
Patient's daughter notified.

## 2011-12-01 ENCOUNTER — Ambulatory Visit: Payer: Medicare Other | Admitting: Cardiovascular Disease

## 2011-12-01 ENCOUNTER — Other Ambulatory Visit: Payer: Self-pay | Admitting: Internal Medicine

## 2011-12-03 ENCOUNTER — Other Ambulatory Visit: Payer: Self-pay | Admitting: Internal Medicine

## 2011-12-09 ENCOUNTER — Telehealth: Payer: Self-pay | Admitting: Internal Medicine

## 2011-12-09 NOTE — Telephone Encounter (Signed)
Patient needs a prior authorization on Duoneb inhaler.  Prior authorization has been initiated, [facility] is faxing the form to be filled out.

## 2011-12-10 ENCOUNTER — Telehealth: Payer: Self-pay | Admitting: Cardiovascular Disease

## 2011-12-10 NOTE — Telephone Encounter (Signed)
Pt daughter calling states that her father BP is still running high and would like to discuss this with a nurse

## 2011-12-10 NOTE — Telephone Encounter (Signed)
Daughter is concerned pt blood pressure medication is wearing off too soon.  Her father is feeling better since discharge from hospital [facility]) She does not know what medications he is on just that he is not on metoprolol She is concerned that we should know what medications he is on from [facility]. Explained our computer systems were not the same at this point. Pt has also not seen Dr. [name] yet in the office where we would update med list. What we have is the current list provided by pcp which is in the computer. However, Blood pressure this am 167/60   After medications  bp  140/60 Have offered for daughter call back with list of meds for update and 2. Keep a log of blood pressures until appt on 3/15. She agrees reassurance given Mylo Red RN

## 2011-12-11 ENCOUNTER — Telehealth: Payer: Self-pay | Admitting: Physician Assistant

## 2011-12-11 NOTE — Telephone Encounter (Signed)
Patient's caregiver called re: BP medication recommendation. He was recently discharged from the hospital with Coreg 12.5mg  BID. This has been controlling his BPs well (SBP 130s); however, has been "wearing off" 2-3 hours prior to the evening dose with subsequent increase in SBP to 170-200. The patient had apparently stopped taking Coreg today, and resorted to self-dosing previously prescribed Lopressor. He has a follow-up appointment in our office on Friday. I advised to continue current Coreg dosing with close monitoring. I advised to take 1/2 Lopressor tablet PRN for BP elevation prior to next Coreg dose. Further antihypertensive adjustments may be made at his follow-up appointment. The patient is otherwise asymptomatic and caregiver expressed understanding.    Jacqulyn Bath, PA-C 12/11/2011 11:00 PM

## 2011-12-13 ENCOUNTER — Other Ambulatory Visit: Payer: Self-pay | Admitting: Internal Medicine

## 2011-12-13 ENCOUNTER — Telehealth: Payer: Self-pay | Admitting: *Deleted

## 2011-12-13 NOTE — Telephone Encounter (Signed)
bp issues, daughter said usual bp 140/60. States his bp been up and down at diff times of day. Friday night it was 208/80, asked if he has anxiety and she stated yes, later that evening it was 173/78 p 80. He has app Friday with Dr [name] and I told her to have him decrease sodium in diet//foods reviewed, asked them to get bp 1.5 hrs after taking med and once in evening and keep record with pulses and bring to app. Told her bps are not critically high and I will send a msg to Dr [name] for advice but he may want to wait till app this week to see bp review. Please advise

## 2011-12-13 NOTE — Telephone Encounter (Signed)
Will wait until I see him in clinic to review numbers

## 2011-12-14 ENCOUNTER — Encounter: Payer: Self-pay | Admitting: Internal Medicine

## 2011-12-14 ENCOUNTER — Ambulatory Visit (INDEPENDENT_AMBULATORY_CARE_PROVIDER_SITE_OTHER): Payer: Medicare Other | Admitting: Internal Medicine

## 2011-12-14 DIAGNOSIS — E1165 Type 2 diabetes mellitus with hyperglycemia: Secondary | ICD-10-CM

## 2011-12-14 DIAGNOSIS — D649 Anemia, unspecified: Secondary | ICD-10-CM | POA: Insufficient documentation

## 2011-12-14 DIAGNOSIS — F419 Anxiety disorder, unspecified: Secondary | ICD-10-CM

## 2011-12-14 DIAGNOSIS — I255 Ischemic cardiomyopathy: Secondary | ICD-10-CM

## 2011-12-14 DIAGNOSIS — I2589 Other forms of chronic ischemic heart disease: Secondary | ICD-10-CM

## 2011-12-14 DIAGNOSIS — E118 Type 2 diabetes mellitus with unspecified complications: Secondary | ICD-10-CM

## 2011-12-14 DIAGNOSIS — F411 Generalized anxiety disorder: Secondary | ICD-10-CM

## 2011-12-14 MED ORDER — ALPRAZOLAM 0.25 MG PO TABS: 0.2500 mg | ORAL_TABLET | Freq: Two times a day (BID) | ORAL | Status: AC | PRN

## 2011-12-14 NOTE — Telephone Encounter (Signed)
Patrick [name]'s daughter was notified.

## 2011-12-14 NOTE — Telephone Encounter (Signed)
N/A.  LMTC. 

## 2011-12-14 NOTE — Progress Notes (Signed)
Subjective:    Patient ID: Patrick [name], male    DOB: June 03, 1941, 71 y.o.   MRN: [id number]  HPI    70 yo white male with complicated medical history plagued with nonadherence and loss to follow up,  Including CAD/PAD with prior CABG and PTCA LE with stents, COPD with ongoing tobacco abuse, DM 2 poorly controlled, History of prior GI bleed with recent diagnosis of anemia, presents for hospital follow up after being discharged Feb 25 from [facility] for hypoxic respiratory failure secondary to pulmonary edema.  Ef < 25% by ECHO this admission, prior EF unknown as patient had not been seen by cardiology locally and was previously treated at [facility].  Had cardiology and GI evaluations while hospitalized but refused EGD and ICD.  Set up with [facility] health RN for CHF care,  RN has already been out,  In the process of changing RNs bc he didn't like her demeanor.  Weighing himself daily but did not bring weights in.  Averaging 167 lbs at home.  Very anxious,  Has been checking his  BP 6-7 times daily,   Called  Cardiology triage on Saturday PM for bp 200, in the setting of self dc of coreg and resumed use of  lopressor.  Resumed Coreg at 12.5 bid per PA 's advice.   Scheduled to see Gollan on Friday. Trying to abstain from tobacco but had developed a rash to the patch placed on him at [facility].  Past Medical History  Diagnosis Date  . Hypertension   . Hyperlipidemia   . Coronary artery disease   . PAD (peripheral artery disease)   . Diabetes mellitus   . Anxiety   . Tobacco abuse   . Peripheral vascular disease     with prior stents placed bilaterally   Current Outpatient Prescriptions on File Prior to Visit  Medication Sig Dispense Refill  . citalopram (CELEXA) 20 MG tablet TAKE 1 TABLET BY MOUTH EVERY DAY  30 tablet  5  . dimenhyDRINATE (DRAMAMINE) 50 MG tablet Take 50 mg by mouth every 8 (eight) hours as needed.        Marland Kitchen esomeprazole (NEXIUM) 40 MG capsule Take 40 mg by mouth daily before  breakfast.        . ferrous sulfate 325 (65 FE) MG EC tablet Take 325 mg by mouth daily with breakfast.        . gabapentin (NEURONTIN) 300 MG capsule Take 300 mg by mouth daily.       . insulin aspart protamine-insulin aspart (NOVOLOG 70/30) (70-30) 100 UNIT/ML injection Inject into the skin as directed.        . insulin glargine (LANTUS) 100 UNIT/ML injection Inject 30 Units into the skin at bedtime.       . nitroGLYCERIN (NITROSTAT) 0.4 MG SL tablet Place 1 tablet (0.4 mg total) under the tongue every 5 (five) minutes as needed.  30 tablet  6  . Oxycodone HCl (OXYCONTIN) 60 MG TB12 Take 1 tablet (60 mg total) by mouth every 12 (twelve) hours as needed.  60 each  0  . oxyCODONE-acetaminophen (PERCOCET) 5-325 MG per tablet Take 1 tablet by mouth every 6 (six) hours as needed.  120 tablet  0  . PROAIR HFA 108 (90 BASE) MCG/ACT inhaler INHALE 1-2 PUFFS BY MOUTH EVERY 4-6 HOURS AS NEEDED  18 g  3  . simvastatin (ZOCOR) 40 MG tablet Take 40 mg by mouth at bedtime.  Review of Systems  Constitutional: Negative for fever, chills, diaphoresis, activity change, appetite change, fatigue and unexpected weight change.  HENT: Negative for hearing loss, ear pain, nosebleeds, congestion, sore throat, facial swelling, rhinorrhea, sneezing, drooling, mouth sores, trouble swallowing, neck pain, neck stiffness, dental problem, voice change, postnasal drip, sinus pressure, tinnitus and ear discharge.   Eyes: Negative for photophobia, pain, discharge, redness, itching and visual disturbance.  Respiratory: Negative for apnea, cough, choking, chest tightness, shortness of breath, wheezing and stridor.   Cardiovascular: Negative for chest pain, palpitations and leg swelling.  Gastrointestinal: Negative for nausea, vomiting, abdominal pain, diarrhea, constipation, blood in stool, abdominal distention, anal bleeding and rectal pain.  Genitourinary: Negative for dysuria, urgency, frequency, hematuria, flank pain,  decreased urine volume, scrotal swelling, difficulty urinating and testicular pain.  Musculoskeletal: Positive for back pain, arthralgias and gait problem. Negative for myalgias and joint swelling.  Skin: Negative for color change, rash and wound.  Neurological: Negative for dizziness, tremors, seizures, syncope, speech difficulty, weakness, light-headedness, numbness and headaches.  Psychiatric/Behavioral: Negative for suicidal ideas, hallucinations, behavioral problems, confusion, sleep disturbance, dysphoric mood, decreased concentration and agitation. The patient is not nervous/anxious.    BP 152/46  Pulse 80  Temp(Src) 98.7 F (37.1 C) (Oral)  Resp 16  Wt 171 lb (77.565 kg)  SpO2 95%     Objective:   Physical Exam  Constitutional: He is oriented to person, place, and time.  HENT:  Head: Normocephalic and atraumatic.  Mouth/Throat: Oropharynx is clear and moist.  Eyes: Conjunctivae and EOM are normal.  Neck: Normal range of motion. Neck supple. No JVD present. No thyromegaly present.  Cardiovascular: Normal rate, regular rhythm and normal heart sounds.   Pulmonary/Chest: Effort normal and breath sounds normal. He has no wheezes. He has no rales.  Abdominal: Soft. Bowel sounds are normal. He exhibits no mass. There is no tenderness. There is no rebound.  Musculoskeletal: Normal range of motion. He exhibits no edema.  Neurological: He is alert and oriented to person, place, and time.  Skin: Skin is warm and dry.  Psychiatric: He has a normal mood and affect.       Assessment & Plan:   Anxiety His past office visits by me have been problematic in that his priorities were in finding and relieving the source of his hip pain, leg pain and back pain with very little patient emphasis on other issues.  I have had a very difficult time effecting a change in his diabetes  Control and mitigation of modifiable risk factors due to his continued use of tobacco.  At one point prior to my move  to [place] he presented with cellulitis secondary to a recent large tatoo he had placed on both forearms.  Today his demeanor is different, ("make me well") due to his recent hospitalization which has driven home the fact that he has significant life limiting comorbidities and his anxiety is causing him to be overzealous in checking his blood pressure.  I have rx'd alprazolam bid (already taking celexa)  And asked him to stop checking his BP more than once daily.  Continue carvedilol and followup with his new cardiologist , Dossie Arbour. Return in one month.   Anemia Likely acute on chronic, with history of prior GI bleed secondary to use of Goody's powders.  .   hgb prior to admission was 9.4 in early February. His stools were heme positive per DC summary .He refused endoscopy.  Aspirin was held at discharge.  Unfortunately I  did not have access to his dc summary at time of visit and patient did not report all events.  Will discuss EGD/colonoscopy at next visit in one month.  He would be high risk for adverse events given his cardiomyopathy and COPD.    Cardiomyopathy, ischemic Severe, with EF < 25%.  On every other day lasix, coreg,  ACE I Held at admission due to ARF. Continue Home Health RN for CHF monitoring.  resume ACE inihibitor at next meeting if Cr stable. Refused AICD.   Diabetes mellitus type 2 with complications, uncontrolled After prior OV I had referred him to Endocrinology since I have been unable to effect any change in his diabetes control , despite repeated attempts to titrate his insulin.  He has been resistant to submitting blood sugars for analysis.     Updated Medication List Outpatient Encounter Prescriptions as of 12/14/2011  Medication Sig Dispense Refill  . acetaminophen (TYLENOL) 500 MG tablet Take 500 mg by mouth every 6 (six) hours as needed.      . carvedilol (COREG) 12.5 MG tablet Take 12.5 mg by mouth 2 (two) times daily with a meal.      . citalopram (CELEXA) 20 MG  tablet TAKE 1 TABLET BY MOUTH EVERY DAY  30 tablet  5  . dimenhyDRINATE (DRAMAMINE) 50 MG tablet Take 50 mg by mouth every 8 (eight) hours as needed.        Marland Kitchen esomeprazole (NEXIUM) 40 MG capsule Take 40 mg by mouth daily before breakfast.        . ferrous sulfate 325 (65 FE) MG EC tablet Take 325 mg by mouth daily with breakfast.        . furosemide (LASIX) 20 MG tablet Take 20 mg by mouth every other day.      . gabapentin (NEURONTIN) 300 MG capsule Take 300 mg by mouth daily.       . insulin aspart protamine-insulin aspart (NOVOLOG 70/30) (70-30) 100 UNIT/ML injection Inject into the skin as directed.        . insulin glargine (LANTUS) 100 UNIT/ML injection Inject 30 Units into the skin at bedtime.       Marland Kitchen ipratropium-albuterol (DUONEB) 0.5-2.5 (3) MG/3ML SOLN Take 3 mLs by nebulization every 4 (four) hours as needed.      . nitroGLYCERIN (NITROSTAT) 0.4 MG SL tablet Place 1 tablet (0.4 mg total) under the tongue every 5 (five) minutes as needed.  30 tablet  6  . Oxycodone HCl (OXYCONTIN) 60 MG TB12 Take 1 tablet (60 mg total) by mouth every 12 (twelve) hours as needed.  60 each  0  . oxyCODONE-acetaminophen (PERCOCET) 5-325 MG per tablet Take 1 tablet by mouth every 6 (six) hours as needed.  120 tablet  0  . PROAIR HFA 108 (90 BASE) MCG/ACT inhaler INHALE 1-2 PUFFS BY MOUTH EVERY 4-6 HOURS AS NEEDED  18 g  3  . simvastatin (ZOCOR) 40 MG tablet Take 40 mg by mouth at bedtime.        Marland Kitchen tiotropium (SPIRIVA) 18 MCG inhalation capsule Place 18 mcg into inhaler and inhale daily.      Marland Kitchen DISCONTD: furosemide (LASIX) 40 MG tablet TAKE 1 TABLET BY MOUTH EVERY DAY AS NEEDED  30 tablet  3  . ALPRAZolam (XANAX) 0.25 MG tablet Take 1 tablet (0.25 mg total) by mouth 2 (two) times daily as needed for anxiety.  60 tablet  2  . DISCONTD: metoprolol tartrate (LOPRESSOR) 25 MG tablet Take 1/2 tablet  by mouth twice daily  30 tablet  6  . DISCONTD: Multiple Vitamin (MULTIVITAMIN) tablet Take 1 tablet by mouth daily.         Marland Kitchen DISCONTD: nicotine (NICODERM CQ) 21 mg/24hr patch Place 1 patch onto the skin daily.  28 patch  2  . DISCONTD: oxyCODONE-acetaminophen (PERCOCET) 5-325 MG per tablet Take 1 tablet by mouth every 6 (six) hours as needed.  120 tablet  0  . DISCONTD: PARoxetine (PAXIL) 30 MG tablet TAKE 2 TABLETS BY MOUTH EVERY DAY  60 tablet  5  . DISCONTD: potassium chloride SA (K-DUR,KLOR-CON) 20 MEQ tablet TAKE 1 TABLET BY MOUTH EVERY DAY AS NEEDED  30 tablet  2

## 2011-12-14 NOTE — Assessment & Plan Note (Signed)
After prior OV I had referred him to Endocrinology since I have been unable to effect any change in his diabetes control , despite repeated attempts to titrate his insulin.  He has been resistant to submitting blood sugars for analysis.

## 2011-12-14 NOTE — Assessment & Plan Note (Signed)
Likely acute on chronic, with history of prior GI bleed secondary to use of Goody's powders.  .   hgb prior to admission was 9.4 in early February. His stools were heme positive per DC summary .He refused endoscopy.  Aspirin was held at discharge.  Unfortunately I did not have access to his dc summary at time of visit and patient did not report all events.  Will discuss EGD/colonoscopy at next visit in one month.  He would be high risk for adverse events given his cardiomyopathy and COPD.

## 2011-12-14 NOTE — Assessment & Plan Note (Signed)
Severe, with EF < 25%.  On every other day lasix, coreg,  ACE I Held at admission due to ARF. Continue Home Health RN for CHF monitoring.  resume ACE inihibitor at next meeting if Cr stable. Refused AICD.

## 2011-12-14 NOTE — Assessment & Plan Note (Signed)
His past office visits by me have been problematic in that his priorities were in finding and relieving the source of his hip pain, leg pain and back pain with very little patient emphasis on other issues.  I have had a very difficult time effecting a change in his diabetes  Control and mitigation of modifiable risk factors due to his continued use of tobacco.  At one point prior to my move to [place] he presented with cellulitis secondary to a recent large tatoo he had placed on both forearms.  Today his demeanor is different, ("make me well") due to his recent hospitalization which has driven home the fact that he has significant life limiting comorbidities and his anxiety is causing him to be overzealous in checking his blood pressure.  I have rx'd alprazolam bid (already taking celexa)  And asked him to stop checking his BP more than once daily.  Continue carvedilol and followup with his new cardiologist , Dossie Arbour. Return in one month.

## 2011-12-14 NOTE — Patient Instructions (Addendum)
Continue coreg at 12.5 mg twice daily  Until you see the heart doctor.   Do not resume the lisinopril until one of your doctors tells you to.    I am starting a medicine to take one or twice daily as needed for your nerves called alprazolam.  If your blood pressure goes up to > 170 and you are taking your blood pressure medicine on time,  You may try one dose of the alprazolam to see if it helps

## 2011-12-15 ENCOUNTER — Encounter: Payer: Self-pay | Admitting: *Deleted

## 2011-12-17 ENCOUNTER — Ambulatory Visit (INDEPENDENT_AMBULATORY_CARE_PROVIDER_SITE_OTHER): Payer: Medicare Other | Admitting: Cardiovascular Disease

## 2011-12-17 ENCOUNTER — Encounter: Payer: Self-pay | Admitting: Cardiovascular Disease

## 2011-12-17 VITALS — BP 105/61 | HR 61 | Ht 70.0 in | Wt 168.0 lb

## 2011-12-17 DIAGNOSIS — R609 Edema, unspecified: Secondary | ICD-10-CM

## 2011-12-17 DIAGNOSIS — I10 Essential (primary) hypertension: Secondary | ICD-10-CM

## 2011-12-17 DIAGNOSIS — F411 Generalized anxiety disorder: Secondary | ICD-10-CM

## 2011-12-17 DIAGNOSIS — E118 Type 2 diabetes mellitus with unspecified complications: Secondary | ICD-10-CM

## 2011-12-17 DIAGNOSIS — I739 Peripheral vascular disease, unspecified: Secondary | ICD-10-CM

## 2011-12-17 DIAGNOSIS — E785 Hyperlipidemia, unspecified: Secondary | ICD-10-CM

## 2011-12-17 DIAGNOSIS — E1165 Type 2 diabetes mellitus with hyperglycemia: Secondary | ICD-10-CM

## 2011-12-17 DIAGNOSIS — IMO0002 Reserved for concepts with insufficient information to code with codable children: Secondary | ICD-10-CM

## 2011-12-17 DIAGNOSIS — F17200 Nicotine dependence, unspecified, uncomplicated: Secondary | ICD-10-CM

## 2011-12-17 DIAGNOSIS — F419 Anxiety disorder, unspecified: Secondary | ICD-10-CM

## 2011-12-17 DIAGNOSIS — I25810 Atherosclerosis of coronary artery bypass graft(s) without angina pectoris: Secondary | ICD-10-CM

## 2011-12-17 MED ORDER — ISOSORBIDE MONONITRATE ER 30 MG PO TB24: 30.0000 mg | ORAL_TABLET | Freq: Every day | ORAL | Status: DC

## 2011-12-17 MED ORDER — FUROSEMIDE 20 MG PO TABS: 20.0000 mg | ORAL_TABLET | Freq: Two times a day (BID) | ORAL | Status: DC

## 2011-12-17 NOTE — Patient Instructions (Addendum)
You are doing well. Please increase the lasix to 20 mg daily Keep your weight around 160 lbs   Try isosorbide 1/2 a day if your blood pressure continue to run high Take a full pill if needed for high blood pressure.  Please call us if you have new issues that need to be addressed before your next appt.  Your physician wants you to follow-up in: 3 months.  You will receive a reminder letter in the mail two months in advance. If you don't receive a letter, please call our office to schedule the follow-up appointment.

## 2011-12-17 NOTE — Assessment & Plan Note (Signed)
We'll continue aggressive cholesterol management, goal LDL less than 70 

## 2011-12-17 NOTE — Progress Notes (Signed)
Patient ID: Patrick [name], male    DOB: [birth year], 71 y.o.   MRN: [id number]  HPI Comments: Mr. 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 He was admitted with systolic CHF. He also had renal dysfunction likely secondary to long-standing poorly controlled diabetes. Hemoglobin A1c was 10  Echocardiogram at that time showed severely depressed and weak function of less than 25%, diastolic dysfunction, moderately elevated right ventricular systolic pressures  During his hospital course, heart failure regimen was started with low-dose ACE, beta blocker, diuretic with improvement of his symptoms. He presents today for routine followup. Overall he reports that he is feeling well. He denies any significant shortness of breath. He has not been monitor his weight closely and reports he is 163 pounds at home with no clothes. His blood pressure has been elevated at home with recordings in the 150-160 range. ACE inhibitor has been held as of discharge secondary to renal insufficiency.  EKG today shows normal sinus rhythm with rate 57 beats per minute with interventricular conduction delay, nonspecific ST and T wave abnormality in lead V5, V6, 2, 3, aVF  Outpatient Encounter Prescriptions as of 12/17/2011  Medication Sig Dispense Refill  . acetaminophen (TYLENOL) 500 MG tablet Take 500 mg by mouth every 6 (six) hours as needed.      . ALPRAZolam (XANAX) 0.25 MG tablet Take 1 tablet (0.25 mg total) by mouth 2 (two) times daily as needed for anxiety.  60 tablet  2  . carvedilol (COREG) 12.5 MG tablet Take 12.5 mg by mouth 2  (two) times daily with a meal.      . citalopram (CELEXA) 20 MG tablet TAKE 1 TABLET BY MOUTH EVERY DAY  30 tablet  5  . dimenhyDRINATE (DRAMAMINE) 50 MG tablet Take 50 mg by mouth every 8 (eight) hours as needed.        Marland Kitchen esomeprazole (NEXIUM) 40 MG capsule Take 40 mg by mouth daily before breakfast.        . ferrous sulfate 325 (65 FE) MG EC tablet Take 325 mg by mouth daily with breakfast.        . gabapentin (NEURONTIN) 300 MG capsule Take 300 mg by mouth daily.       . insulin aspart protamine-insulin aspart (NOVOLOG 70/30) (70-30) 100 UNIT/ML injection Inject into the skin as directed.        . insulin glargine (LANTUS) 100 UNIT/ML injection Inject 30 Units into the skin at bedtime.       Marland Kitchen ipratropium-albuterol (DUONEB) 0.5-2.5 (3) MG/3ML SOLN Take 3 mLs by nebulization every 4 (four) hours as needed.      . nitroGLYCERIN (NITROSTAT) 0.4 MG SL tablet Place 1 tablet (0.4 mg total) under the tongue every 5 (five) minutes as needed.  30 tablet  6  . Oxycodone HCl (OXYCONTIN) 60 MG TB12 Take 1 tablet (60 mg total) by mouth every 12 (twelve) hours as needed.  60 each  0  . oxyCODONE-acetaminophen (PERCOCET) 5-325 MG per tablet Take 1 tablet by mouth every 6 (six) hours as needed.  120 tablet  0  . PROAIR HFA 108 (  90 BASE) MCG/ACT inhaler INHALE 1-2 PUFFS BY MOUTH EVERY 4-6 HOURS AS NEEDED  18 g  3  . simvastatin (ZOCOR) 40 MG tablet Take 40 mg by mouth at bedtime.        Marland Kitchen tiotropium (SPIRIVA) 18 MCG inhalation capsule Place 18 mcg into inhaler and inhale daily.      .  furosemide (LASIX) 20 MG tablet Take 20 mg by mouth every other day.        Review of Systems  Constitutional: Negative.   HENT: Negative.   Eyes: Negative.   Respiratory: Positive for shortness of breath.   Cardiovascular: Positive for leg swelling.  Gastrointestinal: Negative.   Musculoskeletal: Negative.   Skin: Negative.   Neurological: Negative.   Hematological: Negative.   Psychiatric/Behavioral: Negative.   All  other systems reviewed and are negative.   BP 105/61  Pulse 61  Ht 5\' 10"  (1.778 m)  Wt 168 lb (76.204 kg)  BMI 24.11 kg/m2  Physical Exam  Nursing note and vitals reviewed. Constitutional: He is oriented to person, place, and time. He appears well-developed and well-nourished.  HENT:  Head: Normocephalic.  Nose: Nose normal.  Mouth/Throat: Oropharynx is clear and moist.  Eyes: Conjunctivae are normal. Pupils are equal, round, and reactive to light.  Neck: Normal range of motion. Neck supple. No JVD present. Carotid bruit is present.  Cardiovascular: Normal rate, regular rhythm, S1 normal, S2 normal and intact distal pulses.  Exam reveals no gallop and no friction rub.   Murmur heard.  Crescendo systolic murmur is present with a grade of 2/6  Pulmonary/Chest: Effort normal. No respiratory distress. He has decreased breath sounds. He has no wheezes. He has no rales. He exhibits no tenderness.  Abdominal: Soft. Bowel sounds are normal. He exhibits no distension. There is no tenderness.  Musculoskeletal: Normal range of motion. He exhibits no edema and no tenderness.  Lymphadenopathy:    He has no cervical adenopathy.  Neurological: He is alert and oriented to person, place, and time. Coordination normal.  Skin: Skin is warm and dry. No rash noted. No erythema.  Psychiatric: He has a normal mood and affect. His behavior is normal. Judgment and thought content normal.           Assessment and Plan

## 2011-12-17 NOTE — Assessment & Plan Note (Signed)
Diabetes control is a major issue given previous hemoglobin A1c of 10. He is working with Dr. Darrick Huntsman.

## 2011-12-17 NOTE — Assessment & Plan Note (Signed)
Cholesterol is at goal on the current lipid regimen. No changes to the medications were made.  

## 2011-12-17 NOTE — Assessment & Plan Note (Signed)
He has stopped smoking since his recent hospitalization.

## 2011-12-17 NOTE — Assessment & Plan Note (Signed)
His family reports elevated blood pressure at home more than 140 systolic. Blood pressure was 120 bilaterally on my check today. We have called in isosorbide mononitrate 30 mg daily and have suggested if blood pressure continues to be elevated at home that he try one half tab to start, possibly titrating to a full tab for blood pressure if needed.

## 2011-12-17 NOTE — Assessment & Plan Note (Signed)
He continues to have trace to 1+ edema to the mid shins. We have suggested he take Lasix 20 mg daily.

## 2011-12-17 NOTE — Assessment & Plan Note (Signed)
Currently with no symptoms of angina.  He was not particularly interested in having workup for underlying coronary artery disease while in the hospital. Currently he is relatively asymptomatic and he does have long-standing ischemic cardiomyopathy. He has stopped smoking. He is on cholesterol medication. Aspirin, beta blocker.   We will discuss stress test with him on his next visit.

## 2011-12-29 ENCOUNTER — Telehealth: Payer: Self-pay | Admitting: Cardiovascular Disease

## 2011-12-29 NOTE — Telephone Encounter (Signed)
SPOKE WITH  DAUGHTER  PT IS CURRENTLY  TAKING FUROSEMIDE 20 MG EVERY DAY  AND CARVEDILOL 12.5 MG BID  B/P LAST NIGHT WAS 120/45  AND WAS DIZZY  B/P TODAY WAS 121/48  FEELS FINE PT   DID NOT START IMDUR THERAPY  DAUGHTER AWARE WILL FORWARD TO DR [name] FOR REVIEW . INSTRUCTED [name]  TO HAVE PT CONT TO MONITOR B/P AND HEART RATE  AND TO CALL NEXT WEEK WITH READINGS./CY

## 2011-12-29 NOTE — Telephone Encounter (Signed)
Pt daughter calling stating that his BP is running 120/45 and wants to know what they should do about his medications. Reading was yesterday at lunch

## 2011-12-29 NOTE — Telephone Encounter (Signed)
Could hold Imdur and monitor pressure as you have suggested

## 2011-12-30 NOTE — Telephone Encounter (Signed)
[name] was notified.

## 2011-12-30 NOTE — Telephone Encounter (Signed)
N/A.  LMTC. 

## 2012-01-03 NOTE — Telephone Encounter (Signed)
PT NEVER DID INITIATE IMDUR   B/P TODAY WAS 110/39  WILL FORWARD TO DR [name]  FOR REVIEW  CURRENTLY TAKING CARVEDILOL  12.5 MG BID .Zack Seal

## 2012-01-03 NOTE — Telephone Encounter (Signed)
Pt returning nurse call concerning BP readings

## 2012-01-04 ENCOUNTER — Telehealth: Payer: Self-pay | Admitting: Internal Medicine

## 2012-01-04 ENCOUNTER — Other Ambulatory Visit: Payer: Self-pay | Admitting: Internal Medicine

## 2012-01-04 NOTE — Telephone Encounter (Signed)
Received fax from [facility] re patient feeling dizzy,  Pulse in the 40's.  Please instruct him to  stop carvedilol immediately and resume at 6.25 mg (1/2 tablet) once his pulse is  > 80 and sbp > 140

## 2012-01-05 ENCOUNTER — Encounter: Payer: Self-pay | Admitting: Internal Medicine

## 2012-01-05 NOTE — Telephone Encounter (Signed)
I spoke with patients daughter and notified her of the message.

## 2012-01-12 ENCOUNTER — Other Ambulatory Visit: Payer: Self-pay | Admitting: Internal Medicine

## 2012-01-12 MED ORDER — OXYCODONE HCL 60 MG PO TB12: 1.0000 | ORAL_TABLET | Freq: Two times a day (BID) | ORAL | Status: DC | PRN

## 2012-01-12 MED ORDER — OXYCODONE-ACETAMINOPHEN 5-325 MG PO TABS: 1.0000 | ORAL_TABLET | Freq: Four times a day (QID) | ORAL | Status: DC | PRN

## 2012-01-20 ENCOUNTER — Other Ambulatory Visit: Payer: Self-pay | Admitting: Internal Medicine

## 2012-02-01 ENCOUNTER — Encounter: Payer: Self-pay | Admitting: Cardiovascular Disease

## 2012-02-06 ENCOUNTER — Other Ambulatory Visit: Payer: Self-pay | Admitting: Internal Medicine

## 2012-02-07 ENCOUNTER — Inpatient Hospital Stay: Payer: Self-pay | Admitting: Internal Medicine

## 2012-02-07 DIAGNOSIS — R079 Chest pain, unspecified: Secondary | ICD-10-CM

## 2012-02-07 LAB — CBC
HGB: 11.7 g/dL — ABNORMAL LOW (ref 13.0–18.0)
MCHC: 32 g/dL (ref 32.0–36.0)
MCV: 89 fL (ref 80–100)
Platelet: 223 10*3/uL (ref 150–440)
WBC: 7.3 10*3/uL (ref 3.8–10.6)

## 2012-02-07 LAB — URINALYSIS, COMPLETE
Bacteria: NEGATIVE
Bilirubin,UR: NEGATIVE
Blood: NEGATIVE
Glucose,UR: 300 mg/dL (ref 0–75)
Leukocyte Esterase: NEGATIVE
Ph: 6 (ref 4.5–8.0)
Specific Gravity: 1.011 (ref 1.003–1.030)

## 2012-02-07 LAB — CK TOTAL AND CKMB (NOT AT ARMC)
CK, Total: 113 U/L (ref 35–232)
CK-MB: 6.1 ng/mL — ABNORMAL HIGH (ref 0.5–3.6)

## 2012-02-07 LAB — GLUCOSE, RANDOM: Glucose: 435 mg/dL — ABNORMAL HIGH (ref 65–99)

## 2012-02-07 LAB — BASIC METABOLIC PANEL
Anion Gap: 7 (ref 7–16)
BUN: 37 mg/dL — ABNORMAL HIGH (ref 7–18)
Calcium, Total: 9 mg/dL (ref 8.5–10.1)
EGFR (African American): 49 — ABNORMAL LOW
EGFR (Non-African Amer.): 42 — ABNORMAL LOW
Osmolality: 304 (ref 275–301)
Potassium: 5.1 mmol/L (ref 3.5–5.1)

## 2012-02-07 LAB — TROPONIN I
Troponin-I: <0.02 ng/mL
Troponin-I: 1.9 ng/mL — ABNORMAL HIGH

## 2012-02-07 LAB — PRO B NATRIURETIC PEPTIDE: B-Type Natriuretic Peptide: 2680 pg/mL — ABNORMAL HIGH (ref 0–125)

## 2012-02-08 LAB — MAGNESIUM
Magnesium: 1.4 mg/dL — ABNORMAL LOW
Magnesium: 2 mg/dL

## 2012-02-08 LAB — CBC WITH DIFFERENTIAL/PLATELET
Basophil #: 0.1 10*3/uL (ref 0.0–0.1)
Basophil %: 0.8 %
Eosinophil #: 0.4 10*3/uL (ref 0.0–0.7)
Eosinophil %: 4.4 %
HCT: 32 % — ABNORMAL LOW (ref 40.0–52.0)
HGB: 10 g/dL — ABNORMAL LOW (ref 13.0–18.0)
Lymphocyte #: 1.9 10*3/uL (ref 1.0–3.6)
Lymphocyte %: 24 %
MCHC: 31.3 g/dL — ABNORMAL LOW (ref 32.0–36.0)
MCV: 87 fL (ref 80–100)
Monocyte #: 0.7 x10 3/mm (ref 0.2–1.0)
Neutrophil #: 5 10*3/uL (ref 1.4–6.5)
Platelet: 195 10*3/uL (ref 150–440)
WBC: 8.1 10*3/uL (ref 3.8–10.6)

## 2012-02-08 LAB — APTT
Activated PTT: 32.8 secs (ref 23.6–35.9)
Activated PTT: 39.4 secs — ABNORMAL HIGH (ref 23.6–35.9)
Activated PTT: 50.6 secs — ABNORMAL HIGH (ref 23.6–35.9)

## 2012-02-08 LAB — BASIC METABOLIC PANEL
Anion Gap: 5 — ABNORMAL LOW (ref 7–16)
BUN: 40 mg/dL — ABNORMAL HIGH (ref 7–18)
Calcium, Total: 8.6 mg/dL (ref 8.5–10.1)
EGFR (Non-African Amer.): 35 — ABNORMAL LOW
Osmolality: 297 (ref 275–301)
Potassium: 5.4 mmol/L — ABNORMAL HIGH (ref 3.5–5.1)

## 2012-02-08 LAB — PROTIME-INR: Prothrombin Time: 13.1 secs (ref 11.5–14.7)

## 2012-02-08 LAB — LIPID PANEL
HDL Cholesterol: 34 mg/dL — ABNORMAL LOW (ref 40–60)
Ldl Cholesterol, Calc: 74 mg/dL (ref 0–100)
Triglycerides: 117 mg/dL (ref 0–200)

## 2012-02-09 ENCOUNTER — Telehealth: Payer: Self-pay | Admitting: Internal Medicine

## 2012-02-09 LAB — BASIC METABOLIC PANEL
Chloride: 106 mmol/L (ref 98–107)
Creatinine: 1.67 mg/dL — ABNORMAL HIGH (ref 0.60–1.30)
EGFR (Non-African Amer.): 41 — ABNORMAL LOW
Glucose: 129 mg/dL — ABNORMAL HIGH (ref 65–99)
Osmolality: 292 (ref 275–301)
Potassium: 5 mmol/L (ref 3.5–5.1)
Sodium: 140 mmol/L (ref 136–145)

## 2012-02-09 LAB — APTT: Activated PTT: 36.2 secs — ABNORMAL HIGH (ref 23.6–35.9)

## 2012-02-09 MED ORDER — OXYCODONE-ACETAMINOPHEN 5-325 MG PO TABS: 1.0000 | ORAL_TABLET | Freq: Four times a day (QID) | ORAL | Status: DC | PRN

## 2012-02-09 MED ORDER — OXYCODONE HCL 60 MG PO TB12: 1.0000 | ORAL_TABLET | Freq: Two times a day (BID) | ORAL | Status: DC | PRN

## 2012-02-09 NOTE — Telephone Encounter (Signed)
Patient needs his oxycotin 60mg  and oxycodone 5 mg prescription please call when patient can pick up.

## 2012-02-09 NOTE — Telephone Encounter (Signed)
Please call Patrick [name] tomorrow And document the call  In order to bill for a hospital follow up when we see him ,  Patient has to receive a call from the office within  48 hours of discharge.  Thanks

## 2012-02-09 NOTE — Telephone Encounter (Signed)
Pt discharged from [facility] 02/09/12   Has hospital follow 5/13

## 2012-02-09 NOTE — Telephone Encounter (Signed)
Rxs have been printed, waiting for signature, then will call when ready

## 2012-02-10 ENCOUNTER — Telehealth: Payer: Self-pay | Admitting: Internal Medicine

## 2012-02-10 NOTE — Telephone Encounter (Signed)
Left message asking patients daughter to return my call.

## 2012-02-10 NOTE — Telephone Encounter (Signed)
Daughter called in and wanted to move her dad's appointment on Monday to a different time, I advised this was a 30 min. Appointment and I didn't have anywhere to move the patient to, "ugh then I guess he won't come".  I advised daughter that I would send message to see if you seen a different time I could put patient in.

## 2012-02-10 NOTE — Telephone Encounter (Signed)
Patient called back.  He stated he is following the discharge instructions given to him in the hospital.  He stated he is feeling better since he had a BM recently and his stomach is not hurting nearly as bad.  He was advised to make sure he kept the appt on Monday 5/13 and to bring his discharge summary and his medications to the appt.

## 2012-02-10 NOTE — Telephone Encounter (Signed)
Patrick [name] called back and I advised her that he needed to keep the appt on Monday with Dr. Darrick Huntsman

## 2012-02-10 NOTE — Telephone Encounter (Signed)
Left message asking patient to return my call.

## 2012-02-10 NOTE — Telephone Encounter (Signed)
Caller: Patrick [name]/Child; PCP: Duncan Dull; Call regarding Hx CHF/ Recent MI on 02/07/12. Discharged from hospital on 02/09/12. Abdomen Distended-but had not had BM in 4 Days.Took 2 Ex lax last night and did move bowels this morning -02/10/12 but still feels bloated. Increase in Weight From Yesterday by 4#;  He woke up feeling Nauseous - 02/10/12. He has eaten, no vomiting. No edema in extremities. Voiding QS.  Stool is dark/black but takes Iron. He has appnt to come in @ 0830 on 02/14/12. Advised stool softener Colace and Benefiber/Triage and care advice per Abdominal Distention, DIabetes GI Problems and Constipation Protocols and spoke to [employer] in office. She spoke with MD on call and advised he go back to ER since Dr. Darrick Huntsman not in office this afternoon. CB#: [phone]. SHE WAS INSTRUCTED TO CALL WITH ANY WT GAIN > 2 LBS OVERNIGHT. Disp change from see within 24 hours to ED per MD.

## 2012-02-10 NOTE — Telephone Encounter (Signed)
I tried calling patient but no voicemail has been set up.  I will try again.

## 2012-02-14 ENCOUNTER — Ambulatory Visit (INDEPENDENT_AMBULATORY_CARE_PROVIDER_SITE_OTHER): Payer: Medicare Other | Admitting: Internal Medicine

## 2012-02-14 ENCOUNTER — Encounter: Payer: Self-pay | Admitting: Internal Medicine

## 2012-02-14 VITALS — BP 120/56 | HR 75 | Temp 98.3°F | Resp 18 | Wt 171.2 lb

## 2012-02-14 DIAGNOSIS — Z79899 Other long term (current) drug therapy: Secondary | ICD-10-CM

## 2012-02-14 DIAGNOSIS — I2589 Other forms of chronic ischemic heart disease: Secondary | ICD-10-CM

## 2012-02-14 DIAGNOSIS — I255 Ischemic cardiomyopathy: Secondary | ICD-10-CM

## 2012-02-14 DIAGNOSIS — Z7189 Other specified counseling: Secondary | ICD-10-CM

## 2012-02-14 DIAGNOSIS — J441 Chronic obstructive pulmonary disease with (acute) exacerbation: Secondary | ICD-10-CM

## 2012-02-14 DIAGNOSIS — Z716 Tobacco abuse counseling: Secondary | ICD-10-CM | POA: Insufficient documentation

## 2012-02-14 DIAGNOSIS — F17200 Nicotine dependence, unspecified, uncomplicated: Secondary | ICD-10-CM

## 2012-02-14 LAB — CBC WITH DIFFERENTIAL/PLATELET
Basophils Relative: 0.6 % (ref 0.0–3.0)
Eosinophils Absolute: 0.4 10*3/uL (ref 0.0–0.7)
MCHC: 32.1 g/dL (ref 30.0–36.0)
MCV: 86.2 fl (ref 78.0–100.0)
Monocytes Absolute: 0.8 10*3/uL (ref 0.1–1.0)
Neutrophils Relative %: 61.7 % (ref 43.0–77.0)
Platelets: 243 10*3/uL (ref 150.0–400.0)

## 2012-02-14 MED ORDER — METHYLPREDNISOLONE ACETATE 40 MG/ML IJ SUSP
40.0000 mg | Freq: Once | INTRAMUSCULAR | Status: AC
  Administered 2012-02-14: 40 mg via INTRAMUSCULAR

## 2012-02-14 MED ORDER — PREDNISONE (PAK) 10 MG PO TABS: ORAL_TABLET | ORAL | Status: AC

## 2012-02-14 MED ORDER — DOCUSATE SODIUM 50 MG/5ML PO LIQD: ORAL | Status: DC

## 2012-02-14 MED ORDER — ALBUTEROL SULFATE HFA 108 (90 BASE) MCG/ACT IN AERS: 2.0000 | INHALATION_SPRAY | Freq: Four times a day (QID) | RESPIRATORY_TRACT | Status: DC | PRN

## 2012-02-14 MED ORDER — DOCUSATE SODIUM 50 MG/5ML PO LIQD: 50.0000 mg | Freq: Every day | ORAL | Status: DC

## 2012-02-14 MED ORDER — LEVOFLOXACIN 500 MG PO TABS: 500.0000 mg | ORAL_TABLET | Freq: Every day | ORAL | Status: AC

## 2012-02-14 NOTE — Patient Instructions (Addendum)
Continue to check blood pressure, pulse and weight daily.  Once your bp and pulse remain above 150 (Bp)and 80 (pulse)  for 3 days straight,  We will add back coreg at the lowest dose (3.125 mg twice daily; ok to cut a 6 mg tablet in half, but if pill is > 6 mg , call for [place] to call in a new prescription  Continue the lisinopril  You are having a COPD flare today ,  I am going to prescribe antibiotics and steroids for you to take foe the next week  PLEASE STOP SMOKING  !!!!! There is only so much we can do to save your heart muscle.  If you keep smoking, you tie our hands!!

## 2012-02-14 NOTE — Assessment & Plan Note (Signed)
His recent admission for chest pain secondary to AMI resulted in a myoview but no cardiac cath because patient refused.  EF was 45 % and he was discharged home off of beta blocker and on low dose furosemide.  Will resume carvedilol at 3.125 mg twice daily for persistent systolic > 150.

## 2012-02-14 NOTE — Assessment & Plan Note (Signed)
Patient has resumed smoking despite his recent admission for AMI.  Strong counselling given including a discussion of the immediate effects of nicotine on blood flow.

## 2012-02-14 NOTE — Progress Notes (Signed)
Patient ID: Patrick [name], male   DOB: May 31, 1941, 71 y.o.   MRN: [id number]  Patient Active Problem List  Diagnoses  . HYPERLIPIDEMIA-MIXED  . TOBACCO ABUSE  . HYPERTENSION, BENIGN  . CAD, AUTOLOGOUS BYPASS GRAFT  . EDEMA  . Diabetes mellitus type 2 with complications, uncontrolled  . Anxiety  . Peripheral vascular disease  . Shoulder pain, left  . Suprasellar mass  . Hip pain, left  . Anemia  . Cardiomyopathy, ischemic  . COPD exacerbation    Subjective:  CC:   Chief Complaint  Patient presents with  . Follow-up    Hospital    HPI:   Patrick [name] a 71 y.o. male who presents for hospital followup.  Was admitted with sudden onset of chest pain on Monday May 6 and treated for acute AMI .  Seen by  Dr.  [name], had a stress tes, because the offered Cardiac cath was deferred by patient. Stress test was reportedly normal but [facility] not currently accessible.  Was discharged home last Wednesday .  The week prior to admission he  took additional diuretics for weight gain of 4 lbs (40 mg was prescribed,  He increased to 80 mg daily  For four days) , then started feeling weak and jittery and weight gain continued until he developed chest pain and was admitted.   Since discharge his weight has been stable at 160 to 161 , on 20 mg lasix. .  But he reports malaise.  He has resumed smoking.    Past Medical History  Diagnosis Date  . Hypertension   . Hyperlipidemia   . Coronary artery disease     lbbb  . PAD (peripheral artery disease)   . Diabetes mellitus     poorly controlled  . Anxiety   . Tobacco abuse   . Peripheral vascular disease     with prior stents placed bilaterally  . MI (myocardial infarction) 2000  . Anemia   . COPD exacerbation   . Acute renal failure     due to ATN versus hypovolemia  . Acute exacerbation of CHF (congestive heart failure)     EF 25%,    Past Surgical History  Procedure Date  . Coronary artery bypass graft     x4  . Stents in legs 03/2010    [facility]  . Cholecystectomy   . Cardiac catheterization 2010    [facility] [facility]       . methylPREDNISolone acetate  40 mg Intramuscular Once     The following portions of the patient's history were reviewed and updated as appropriate: Allergies, current medications, and problem list.    Review of Systems:   12 Pt  review of systems was negative except those addressed in the HPI,     History   Social History  . Marital Status: Single    Spouse Name: N/A    Number of Children: N/A  . Years of Education: N/A   Occupational History  . Not on file.   Social History Main Topics  . Smoking status: Current Everyday Smoker -- 1.0 packs/day for 30 years    Types: Cigarettes  . Smokeless tobacco: Never Used  . Alcohol Use: No  . Drug Use: No  . Sexually Active: Not on file   Other Topics Concern  . Not on file   Social History Narrative  . No narrative on file    Objective:  BP 120/56  Pulse 75  Temp(Src) 98.3 F (36.8  C) (Oral)  Resp 18  Wt 171 lb 4 oz (77.678 kg)  SpO2 92%  General appearance: alert, cooperative and appears stated age Ears: normal TM's and external ear canals both ears Throat: lips, mucosa, and tongue normal; teeth and gums normal Neck: no adenopathy, no carotid bruit, supple, symmetrical, trachea midline and thyroid not enlarged, symmetric, no tenderness/mass/nodules Back: symmetric, no curvature. ROM normal. No CVA tenderness. Lungs: clear to auscultation bilaterally Heart: regular rate and rhythm, S1, S2 normal, no murmur, click, rub or gallop Abdomen: soft, non-tender; bowel sounds normal; no masses,  no organomegaly Pulses: 2+ and symmetric Skin: Skin color, texture, turgor normal. No rashes or lesions Lymph nodes: Cervical, supraclavicular, and axillary nodes normal.  Assessment and Plan:  COPD exacerbation He is currently wheezing.  Will treat with prednisone and fluoroquinolone/   Cardiomyopathy, ischemic His  recent admission for chest pain secondary to AMI resulted in a myoview but no cardiac cath because patient refused.  EF was 45 % and he was discharged home off of beta blocker and on low dose furosemide.  Will resume carvedilol at 3.125 mg twice daily for persistent systolic > 150.   TOBACCO ABUSE Patient has resumed smoking despite his recent admission for AMI.  Strong counselling given including a discussion of the immediate effects of nicotine on blood flow.   Tobacco abuse counseling counselling given , again.     Updated Medication List Outpatient Encounter Prescriptions as of 02/14/2012  Medication Sig Dispense Refill  . acetaminophen (TYLENOL) 500 MG tablet Take 500 mg by mouth every 6 (six) hours as needed.      Marland Kitchen albuterol (PROAIR HFA) 108 (90 BASE) MCG/ACT inhaler Inhale 2 puffs into the lungs every 6 (six) hours as needed for wheezing or shortness of breath.  18 g  3  . aspirin 81 MG tablet Take 81 mg by mouth daily.      . citalopram (CELEXA) 20 MG tablet TAKE 1 TABLET BY MOUTH EVERY DAY  30 tablet  5  . dimenhyDRINATE (DRAMAMINE) 50 MG tablet Take 50 mg by mouth every 8 (eight) hours as needed.        Marland Kitchen esomeprazole (NEXIUM) 40 MG capsule Take 40 mg by mouth daily before breakfast.        . ferrous sulfate 325 (65 FE) MG EC tablet Take 325 mg by mouth daily with breakfast.        . furosemide (LASIX) 20 MG tablet Take 1 tablet (20 mg total) by mouth 2 (two) times daily.  60 tablet  6  . gabapentin (NEURONTIN) 300 MG capsule Take 300 mg by mouth 3 (three) times daily.       . insulin aspart protamine-insulin aspart (NOVOLOG 70/30) (70-30) 100 UNIT/ML injection Inject into the skin as directed.        . insulin glargine (LANTUS) 100 UNIT/ML injection Inject 30 Units into the skin at bedtime.       . Insulin Syringe-Needle U-100 (INSULIN SYRINGE 1CC/30GX5/16") 30G X 5/16" 1 ML MISC USE FOUR TIMES DAILY  200 each  7  . ipratropium-albuterol (DUONEB) 0.5-2.5 (3) MG/3ML SOLN Take 3 mLs  by nebulization every 4 (four) hours as needed.      Marland Kitchen lisinopril (PRINIVIL,ZESTRIL) 2.5 MG tablet Take 2.5 mg by mouth daily.      . meloxicam (MOBIC) 7.5 MG tablet Take 7.5 mg by mouth daily.      . nitroGLYCERIN (NITROSTAT) 0.4 MG SL tablet Place 1 tablet (0.4 mg total) under  the tongue every 5 (five) minutes as needed.  30 tablet  6  . Oxycodone HCl (OXYCONTIN) 60 MG TB12 Take 1 tablet (60 mg total) by mouth every 12 (twelve) hours as needed.  60 each  0  . oxyCODONE-acetaminophen (PERCOCET) 5-325 MG per tablet Take 1 tablet by mouth every 6 (six) hours as needed.  120 tablet  0  . PARoxetine (PAXIL) 30 MG tablet Take 60 mg by mouth every morning.      . simvastatin (ZOCOR) 40 MG tablet TAKE 1 TABLET BY MOUTH EVERY NIGHT AT BEDTIME FOR CHOLESTEROL  30 tablet  2  . tiotropium (SPIRIVA) 18 MCG inhalation capsule Place 18 mcg into inhaler and inhale daily.      Marland Kitchen DISCONTD: PROAIR HFA 108 (90 BASE) MCG/ACT inhaler INHALE 1-2 PUFFS BY MOUTH EVERY 4-6 HOURS AS NEEDED  18 g  3  . docusate (COLACE) 50 MG/5ML liquid 3 drops in each ear daily to soften earwax  50 mL  0  . levofloxacin (LEVAQUIN) 500 MG tablet Take 1 tablet (500 mg total) by mouth daily.  7 tablet  0  . predniSONE (STERAPRED UNI-PAK) 10 MG tablet 6 tablets on day 1, decrease by tablet daily until gone  21 tablet  0  . DISCONTD: carvedilol (COREG) 12.5 MG tablet Take 12.5 mg by mouth 2 (two) times daily with a meal.      . DISCONTD: docusate (COLACE) 50 MG/5ML liquid Take 5 mLs (50 mg total) by mouth daily.  100 mL  0  . DISCONTD: isosorbide mononitrate (IMDUR) 30 MG 24 hr tablet Take 1 tablet (30 mg total) by mouth daily.  30 tablet  11   Facility-Administered Encounter Medications as of 02/14/2012  Medication Dose Route Frequency Provider Last Rate Last Dose  . methylPREDNISolone acetate (DEPO-MEDROL) injection 40 mg  40 mg Intramuscular Once Sherlene Shams, MD   40 mg at 02/14/12 1055

## 2012-02-14 NOTE — Assessment & Plan Note (Signed)
He is currently wheezing.  Will treat with prednisone and fluoroquinolone/

## 2012-02-14 NOTE — Assessment & Plan Note (Signed)
counselling given , again.

## 2012-02-15 LAB — COMPLETE METABOLIC PANEL WITH GFR
ALT: 8 U/L (ref 0–53)
AST: 13 U/L (ref 0–37)
Albumin: 4 g/dL (ref 3.5–5.2)
Calcium: 9 mg/dL (ref 8.4–10.5)
Chloride: 108 mEq/L (ref 96–112)
Potassium: 5.5 mEq/L — ABNORMAL HIGH (ref 3.5–5.3)

## 2012-02-16 ENCOUNTER — Telehealth: Payer: Self-pay | Admitting: Internal Medicine

## 2012-02-16 MED ORDER — CARVEDILOL 12.5 MG PO TABS
6.2500 mg | ORAL_TABLET | Freq: Two times a day (BID) | ORAL | Status: DC
Start: 2012-02-16 — End: 2012-03-15

## 2012-02-16 NOTE — Telephone Encounter (Signed)
Patient's daughter notified.

## 2012-02-16 NOTE — Telephone Encounter (Signed)
Patients daughter notified, she stated she thinks the dose is 12.5 mg two times daily.  Is this what you want patient to be on now.  Patients daughter stated his BP is back down to normal.  Please advise.

## 2012-02-16 NOTE — Telephone Encounter (Signed)
Resume the carvedilol (coreg) as we discussed (and I wrote out) at his visit. She said she would call back to let us know what dose of it he had at home

## 2012-02-16 NOTE — Telephone Encounter (Signed)
Have him start with 1/2 tablet twice daily.

## 2012-02-16 NOTE — Telephone Encounter (Signed)
Patients daughter called and stated patient took his BP this morning before any medications and it was 190/90 with heart rate of 100.  She wanted to know what he needs to do since you took him off lisinopril and when he was in the hospital they took him off metoprolol.  Please advise.

## 2012-02-18 ENCOUNTER — Other Ambulatory Visit (INDEPENDENT_AMBULATORY_CARE_PROVIDER_SITE_OTHER): Payer: Medicare Other | Admitting: *Deleted

## 2012-02-18 DIAGNOSIS — I10 Essential (primary) hypertension: Secondary | ICD-10-CM

## 2012-02-18 LAB — BASIC METABOLIC PANEL
BUN: 43 mg/dL — ABNORMAL HIGH (ref 6–23)
Calcium: 8.9 mg/dL (ref 8.4–10.5)
GFR: 48.58 mL/min — ABNORMAL LOW (ref >60.00)
Potassium: 5.9 mEq/L — ABNORMAL HIGH (ref 3.5–5.1)
Sodium: 139 mEq/L (ref 135–145)

## 2012-02-18 MED ORDER — SODIUM POLYSTYRENE SULFONATE 15 GM/60ML PO SUSP: ORAL | Status: DC

## 2012-02-18 NOTE — Progress Notes (Signed)
Addended by: Duncan Dull on: 02/18/2012 05:15 PM   Modules accepted: Orders

## 2012-02-22 ENCOUNTER — Telehealth: Payer: Self-pay | Admitting: Internal Medicine

## 2012-02-22 NOTE — Telephone Encounter (Signed)
Patients daughter called and stated he is having cataract surgery June 3rd but is having preop today and wanted to know if he needed to come in for medical clearance.  Please advise.

## 2012-02-22 NOTE — Telephone Encounter (Signed)
MEDICAL CLEARANCE IS SOMETHING ORDERED/REQUESTED  BY THE SURGEON (FROM DOCTOR TO DOCTOR) SO I M NOT SURE OF THE QUESTION.  IS SHE SAYING THAT THE SURGEON IS REQUESTING MEDICAL CLEARANCE  BUT SINCE HE WAS JUST SEEN SHE WANTS TO KNOW IF SHE HAS TO BRING HM BACK FOR ME TO DO THAT?SINCE HE REFUSED THE CARDIAC CATHETERIZATION, HE NEEDS TO GET CLEARANCE FROM HIS CARDIOLOGIST, NOT ME

## 2012-02-23 NOTE — Telephone Encounter (Signed)
Left message asking Ines Bloomer (patients daughter) to return my call.

## 2012-02-24 ENCOUNTER — Other Ambulatory Visit: Payer: Self-pay | Admitting: Internal Medicine

## 2012-02-24 NOTE — Telephone Encounter (Signed)
Clelia Croft advised patient needs to get clearance from his cardiologist.

## 2012-02-24 NOTE — Telephone Encounter (Signed)
Joyce Gross returning your call ok to return call later.

## 2012-02-24 NOTE — Telephone Encounter (Signed)
Klor-Con request, patient's EMR shows Rx D/C on 03.12.13 OV/SLS Please advise.

## 2012-02-29 ENCOUNTER — Telehealth: Payer: Self-pay

## 2012-02-29 NOTE — Telephone Encounter (Signed)
Faxed surgical clearance form to [facility], [place] for cataract extraction w/implant on L eye.  Topical anesthetic Dr. [name] cleared pt for surg Hx MI surg scheduled for 03/07/12

## 2012-03-06 ENCOUNTER — Ambulatory Visit: Payer: Self-pay | Admitting: Ophthalmology

## 2012-03-08 ENCOUNTER — Telehealth: Payer: Self-pay | Admitting: Internal Medicine

## 2012-03-08 NOTE — Telephone Encounter (Signed)
I received another request for medical clearance from Anesthesiology of a cataract surgery scheduled for Jun 11.  The attached paperwork seems to indicate that his potassium was elevated and needed management.  Has patient had his potassium rechecked?  If not, have him come by today fro [facility]  276.8 I cannot clear until he does that.  thanks

## 2012-03-08 NOTE — Telephone Encounter (Signed)
Patients daughter notified, she will bring patient tomorrow for the lab.

## 2012-03-09 ENCOUNTER — Encounter: Payer: Medicare Other | Admitting: Cardiovascular Disease

## 2012-03-09 ENCOUNTER — Other Ambulatory Visit (INDEPENDENT_AMBULATORY_CARE_PROVIDER_SITE_OTHER): Payer: Medicare Other | Admitting: *Deleted

## 2012-03-09 DIAGNOSIS — Z79899 Other long term (current) drug therapy: Secondary | ICD-10-CM

## 2012-03-10 ENCOUNTER — Emergency Department: Payer: Self-pay | Admitting: Emergency Medicine

## 2012-03-10 LAB — BASIC METABOLIC PANEL
Anion Gap: 7 (ref 7–16)
BUN: 39 mg/dL — ABNORMAL HIGH (ref 6–23)
BUN: 30 mg/dL — ABNORMAL HIGH (ref 7–18)
Calcium: 8.7 mg/dL (ref 8.4–10.5)
Chloride: 110 mmol/L — ABNORMAL HIGH (ref 98–107)
EGFR (African American): 58 — ABNORMAL LOW
EGFR (Non-African Amer.): 50 — ABNORMAL LOW
GFR: 48.57 mL/min — ABNORMAL LOW (ref >60.00)
Glucose, Bld: 202 mg/dL — ABNORMAL HIGH (ref 70–99)
Osmolality: 296 (ref 275–301)
Potassium: 5.5 mmol/L — ABNORMAL HIGH (ref 3.5–5.1)
Sodium: 142 mmol/L (ref 136–145)

## 2012-03-13 ENCOUNTER — Telehealth: Payer: Self-pay | Admitting: Internal Medicine

## 2012-03-13 NOTE — Telephone Encounter (Signed)
Patrick [name] stated they did not do anything for him, just drew his labs and sent him home because it was not critical.  She stated the last time patient had elevated potassium you prescribed Kayexelate, and patient thought he took the medication but he didn't, he took dulcolax.  Patrick [name] discovered this Saturday when she was going though his medicine cabinet.  She stated he took the [facility] Saturday, and Patrick [name] stated he is not taking the lisinopril anymore.

## 2012-03-13 NOTE — Telephone Encounter (Signed)
See below noted.

## 2012-03-13 NOTE — Telephone Encounter (Signed)
Cindy from [facility] called and stated patients surgery is tomorrow and wanted to know where the patient stands for clearance.  I told her he was told to go to the ER on Friday because of the critical potassium.  I called Shawn and she stated patient did go to the ER but they did not admit him because his potassium was 5.5.  She wanted to know if he was ok to have the surgery.  Please advise.     Cindy: [phone]

## 2012-03-13 NOTE — Telephone Encounter (Signed)
Clearance depends on what his potassium is now.  What did the ER do for him?  They should have stopped his lisinopril and goven him [employer]

## 2012-03-14 ENCOUNTER — Ambulatory Visit: Payer: Self-pay | Admitting: Ophthalmology

## 2012-03-14 ENCOUNTER — Other Ambulatory Visit: Payer: Self-pay | Admitting: Internal Medicine

## 2012-03-14 LAB — POTASSIUM: Potassium: 5.1 mmol/L (ref 3.5–5.1)

## 2012-03-14 NOTE — Telephone Encounter (Signed)
Ines Bloomer called stated patient had his surgery and is doing fine.  She stated they checked his potassium and it was 5.1, she said he has one dose of kayexalate left and wanted to know if you want him to take it.   Ines Bloomer also stated he is starting to retain fluid again and wanted to know if he could resume the furosemide every other day.  Please advise.

## 2012-03-14 NOTE — Telephone Encounter (Signed)
Yes have him take the kayexelate, because the percocet that he takes for pain is probably driving his potassium up .  Refill the kayexelate and have him take a dose every other day,  And he can resume the furosemide evey other day as well.  Can you send him the printed info we have on low potassium and high potassium foods so he can modify his diet?  thanks

## 2012-03-15 ENCOUNTER — Other Ambulatory Visit: Payer: Self-pay | Admitting: Internal Medicine

## 2012-03-15 MED ORDER — SODIUM POLYSTYRENE SULFONATE 15 GM/60ML PO SUSP: ORAL | Status: DC

## 2012-03-15 MED ORDER — FUROSEMIDE 20 MG PO TABS: 20.0000 mg | ORAL_TABLET | ORAL | Status: DC

## 2012-03-15 NOTE — Telephone Encounter (Signed)
Patients daughter notified, Rxs have been called in.

## 2012-03-17 ENCOUNTER — Other Ambulatory Visit: Payer: Self-pay | Admitting: Internal Medicine

## 2012-03-17 MED ORDER — OXYCODONE HCL 60 MG PO TB12: 1.0000 | ORAL_TABLET | Freq: Two times a day (BID) | ORAL | Status: DC | PRN

## 2012-03-17 MED ORDER — OXYCODONE-ACETAMINOPHEN 5-325 MG PO TABS: 1.0000 | ORAL_TABLET | Freq: Four times a day (QID) | ORAL | Status: DC | PRN

## 2012-03-21 ENCOUNTER — Ambulatory Visit (INDEPENDENT_AMBULATORY_CARE_PROVIDER_SITE_OTHER): Payer: Medicare Other | Admitting: Cardiovascular Disease

## 2012-03-21 ENCOUNTER — Encounter: Payer: Self-pay | Admitting: Cardiovascular Disease

## 2012-03-21 VITALS — BP 145/64 | HR 65 | Ht 70.0 in | Wt 175.0 lb

## 2012-03-21 DIAGNOSIS — E785 Hyperlipidemia, unspecified: Secondary | ICD-10-CM

## 2012-03-21 DIAGNOSIS — IMO0002 Reserved for concepts with insufficient information to code with codable children: Secondary | ICD-10-CM

## 2012-03-21 DIAGNOSIS — E1165 Type 2 diabetes mellitus with hyperglycemia: Secondary | ICD-10-CM

## 2012-03-21 DIAGNOSIS — I739 Peripheral vascular disease, unspecified: Secondary | ICD-10-CM

## 2012-03-21 DIAGNOSIS — I25810 Atherosclerosis of coronary artery bypass graft(s) without angina pectoris: Secondary | ICD-10-CM

## 2012-03-21 DIAGNOSIS — R609 Edema, unspecified: Secondary | ICD-10-CM

## 2012-03-21 DIAGNOSIS — F17200 Nicotine dependence, unspecified, uncomplicated: Secondary | ICD-10-CM

## 2012-03-21 MED ORDER — CARVEDILOL 6.25 MG PO TABS: 6.2500 mg | ORAL_TABLET | Freq: Two times a day (BID) | ORAL | Status: DC

## 2012-03-21 NOTE — Progress Notes (Signed)
Patient ID: Patrick [name], male    DOB: March 31, 1941, 71 y.o.   MRN: [id number]  HPI Comments: Mr. 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 He was admitted with systolic CHF. He also had renal dysfunction likely secondary to long-standing poorly controlled diabetes. Hemoglobin A1c was 10  Echocardiogram at that time showed severely depressed and weak function of less than 25%, diastolic dysfunction, moderately elevated right ventricular systolic pressures  During his hospital course, heart failure regimen was started with low-dose ACE, beta blocker, diuretic with improvement of his symptoms. He presents today for routine followup. Overall he reports that he is feeling well. He denies any significant shortness of breath. He has not been monitor his weight closely and reports he is 163 pounds at home with no clothes. His blood pressure has been elevated at home with recordings in the 150-160 range. ACE inhibitor has been held as of discharge secondary to renal insufficiency.  He had recent cataract surgery. He has been skipping doses of carvedilol as he is afraid it will decrease his blood pressure too much. He denies any significant shortness of breath or chest pain. Otherwise is active with no complaints.  EKG today shows normal sinus rhythm with rate 65 beats per minute with interventricular conduction delay, nonspecific ST and T wave abnormality in lead V5, V6, 2, 3, aVF  Outpatient Encounter Prescriptions as of 03/21/2012  Medication Sig Dispense Refill  . acetaminophen (TYLENOL) 500 MG tablet Take 500 mg by  mouth every 6 (six) hours as needed.      Marland Kitchen albuterol (PROAIR HFA) 108 (90 BASE) MCG/ACT inhaler Inhale 2 puffs into the lungs every 6 (six) hours as needed for wheezing or shortness of breath.  18 g  3  . aspirin 81 MG tablet Take 81 mg by mouth daily.      . carvedilol (COREG) 6.25 MG tablet Take 1 tablet (6.25 mg total) by mouth 2 (two) times daily with a meal.  180 tablet  3  . citalopram (CELEXA) 20 MG tablet TAKE 1 TABLET BY MOUTH EVERY DAY  30 tablet  5  . dimenhyDRINATE (DRAMAMINE) 50 MG tablet Take 50 mg by mouth every 8 (eight) hours as needed.        . docusate (COLACE) 50 MG/5ML liquid 3 drops in each ear daily to soften earwax  50 mL  0  . ferrous sulfate 325 (65 FE) MG EC tablet Take 325 mg by mouth daily with breakfast.        . furosemide (LASIX) 20 MG tablet Take 1 tablet (20 mg total) by mouth every other day.  30 tablet  2  . gabapentin (NEURONTIN) 300 MG capsule Take 300 mg by mouth 3 (three) times daily.       . insulin aspart protamine-insulin aspart (NOVOLOG 70/30) (70-30) 100 UNIT/ML injection Inject 30 Units into the skin as directed.       . insulin glargine (LANTUS) 100 UNIT/ML injection Inject 30 Units into the skin at bedtime.       . Insulin Syringe-Needle U-100 (INSULIN SYRINGE 1CC/30GX5/16") 30G X 5/16" 1 ML MISC  USE FOUR TIMES DAILY  200 each  7  . ipratropium-albuterol (DUONEB) 0.5-2.5 (3) MG/3ML SOLN Take 3 mLs by nebulization every 4 (four) hours as needed.      Marland Kitchen lisinopril (PRINIVIL,ZESTRIL) 2.5 MG tablet Take 2.5 mg by mouth daily.      Marland Kitchen NEXIUM 40 MG capsule TAKE 1 CAPSULE BY MOUTH EVERY DAY  30 capsule  5  . nitroGLYCERIN (NITROSTAT) 0.4 MG SL tablet Place 1 tablet (0.4 mg total) under the tongue every 5 (five) minutes as needed.  30 tablet  6  . Oxycodone HCl (OXYCONTIN) 60 MG TB12 Take 1 tablet (60 mg total) by mouth every 12 (twelve) hours as needed.  60 each  0  . oxyCODONE-acetaminophen (PERCOCET) 5-325 MG per tablet Take 1 tablet by mouth every 6 (six)  hours as needed.  120 tablet  0  . PARoxetine (PAXIL) 30 MG tablet Take 60 mg by mouth every morning.      . simvastatin (ZOCOR) 40 MG tablet TAKE 1 TABLET BY MOUTH EVERY NIGHT AT BEDTIME FOR CHOLESTEROL  30 tablet  2  . tiotropium (SPIRIVA) 18 MCG inhalation capsule Place 18 mcg into inhaler and inhale daily.       Review of Systems  Constitutional: Negative.   HENT: Negative.   Eyes: Positive for visual disturbance.  Gastrointestinal: Negative.   Musculoskeletal: Negative.   Skin: Negative.   Neurological: Negative.   Hematological: Negative.   Psychiatric/Behavioral: Negative.   All other systems reviewed and are negative.   BP 145/64  Pulse 65  Ht 5\' 10"  (1.778 m)  Wt 175 lb (79.379 kg)  BMI 25.11 kg/m2  Physical Exam  Nursing note and vitals reviewed. Constitutional: He is oriented to person, place, and time. He appears well-developed and well-nourished.  HENT:  Head: Normocephalic.  Nose: Nose normal.  Mouth/Throat: Oropharynx is clear and moist.  Eyes: Conjunctivae are normal. Pupils are equal, round, and reactive to light.  Neck: Normal range of motion. Neck supple. No JVD present. Carotid bruit is present.  Cardiovascular: Normal rate, regular rhythm, S1 normal, S2 normal and intact distal pulses.  Exam reveals no gallop and no friction rub.   Murmur heard.  Crescendo systolic murmur is present with a grade of 2/6  Pulmonary/Chest: Effort normal. No respiratory distress. He has decreased breath sounds. He has no wheezes. He has no rales. He exhibits no tenderness.  Abdominal: Soft. Bowel sounds are normal. He exhibits no distension. There is no tenderness.  Musculoskeletal: Normal range of motion. He exhibits no edema and no tenderness.  Lymphadenopathy:    He has no cervical adenopathy.  Neurological: He is alert and oriented to person, place, and time. Coordination normal.  Skin: Skin is warm and dry. No rash noted. No erythema.  Psychiatric: He has a normal mood  and affect. His behavior is normal. Judgment and thought content normal.           Assessment and Plan

## 2012-03-21 NOTE — Assessment & Plan Note (Signed)
Continue aggressive cholesterol management , goal LDL less than 70 

## 2012-03-21 NOTE — Assessment & Plan Note (Signed)
Currently with no symptoms of angina. No further workup at this time. Continue current medication regimen. 

## 2012-03-21 NOTE — Assessment & Plan Note (Signed)
Long history of smoking. Encouraged continued smoking cessation.

## 2012-03-21 NOTE — Patient Instructions (Addendum)
You are doing well. Please decrease the coreg to 6.25 mg twice a day   Work on your sugars/diabetes  Please call us if you have new issues that need to be addressed before your next appt.  Your physician wants you to follow-up in: 6 months.  You will receive a reminder letter in the mail two months in advance. If you don't receive a letter, please call our office to schedule the follow-up appointment.

## 2012-03-21 NOTE — Assessment & Plan Note (Signed)
Cholesterol is at goal on the current lipid regimen. No changes to the medications were made.  

## 2012-03-21 NOTE — Assessment & Plan Note (Signed)
Edema has resolved on his current medication regimen.

## 2012-03-21 NOTE — Assessment & Plan Note (Signed)
We have stressed to him the importance of diet restraint and strict sugar control. Previous diabetes levels were reported controlled.

## 2012-03-27 ENCOUNTER — Encounter: Payer: Self-pay | Admitting: Cardiovascular Disease

## 2012-03-29 ENCOUNTER — Other Ambulatory Visit: Payer: Self-pay | Admitting: Internal Medicine

## 2012-04-02 ENCOUNTER — Other Ambulatory Visit: Payer: Self-pay | Admitting: Internal Medicine

## 2012-04-04 ENCOUNTER — Telehealth: Payer: Self-pay | Admitting: Internal Medicine

## 2012-04-04 ENCOUNTER — Telehealth: Payer: Self-pay | Admitting: Cardiovascular Disease

## 2012-04-04 LAB — BASIC METABOLIC PANEL
Anion Gap: 5 — ABNORMAL LOW (ref 7–16)
BUN: 28 mg/dL — ABNORMAL HIGH (ref 7–18)
Chloride: 107 mmol/L (ref 98–107)
Creatinine: 1.7 mg/dL — ABNORMAL HIGH (ref 0.60–1.30)
EGFR (African American): 47 — ABNORMAL LOW
EGFR (Non-African Amer.): 40 — ABNORMAL LOW
Osmolality: 296 (ref 275–301)

## 2012-04-04 LAB — CBC
HCT: 29.3 % — ABNORMAL LOW (ref 40.0–52.0)
Platelet: 334 10*3/uL (ref 150–440)
RBC: 3.4 10*6/uL — ABNORMAL LOW (ref 4.40–5.90)
RDW: 15.4 % — ABNORMAL HIGH (ref 11.5–14.5)
WBC: 8.1 10*3/uL (ref 3.8–10.6)

## 2012-04-04 LAB — CK TOTAL AND CKMB (NOT AT ARMC): CK-MB: 6.1 ng/mL — ABNORMAL HIGH (ref 0.5–3.6)

## 2012-04-04 NOTE — Telephone Encounter (Signed)
Pt daughter calling stating that she feels pt coreg MG is to low bu that the full 12 mg is to high. Is there another mg in between that he could take

## 2012-04-04 NOTE — Telephone Encounter (Signed)
Daughter states that someone from Medicare has called our office several times trying to get patient some home health care and they haven't heard from our office.  She doesn't know what there number is for [place] to return the call.  She also stated that she faxed over the diabetic [employer] systems for him to be able to get shoes.  Has this been filled out and sent back to them?

## 2012-04-04 NOTE — Telephone Encounter (Signed)
They have only called once and it was yesterday afternoon.  They did send the order for the shoes for Dr. Darrick Huntsman yesterday and it is in the red folder to be signed.  I will fax when ready.  The woman from the insurance company said on the voicemail I did not have to call her back, to just fax the order when ready.

## 2012-04-05 ENCOUNTER — Observation Stay: Payer: Self-pay | Admitting: Internal Medicine

## 2012-04-05 ENCOUNTER — Telehealth: Payer: Self-pay

## 2012-04-05 ENCOUNTER — Telehealth: Payer: Self-pay | Admitting: Internal Medicine

## 2012-04-05 DIAGNOSIS — R079 Chest pain, unspecified: Secondary | ICD-10-CM

## 2012-04-05 LAB — CK TOTAL AND CKMB (NOT AT ARMC): CK-MB: 5.6 ng/mL — ABNORMAL HIGH (ref 0.5–3.6)

## 2012-04-05 LAB — TROPONIN I: Troponin-I: 0.02 ng/mL

## 2012-04-05 NOTE — Telephone Encounter (Signed)
Patients daughter called and left a voicemail stating the patient is dealing with depression, anxiety, and elevated BP.  She stated she took him to the ER last night, they admitted him, and may be sending him him tonight.  She stated he had stopped taking his depression medication but restarted it 3 days ago.  She said she thinks it needs to be increased.  I tried calling Shawn back but had to leave a message for her to return my call.  I advised her that we will be closed tomorrow.

## 2012-04-05 NOTE — Telephone Encounter (Signed)
Message copied by Marcelle Overlie on Wed Apr 05, 2012  3:15 PM ------      Message from: Festus Aloe      Created: Wed Apr 05, 2012  3:08 PM       Trans[facility] Has appointment April 07, 2012 with Dr. Kirke Corin.      Needs telephone call documentation with patient.

## 2012-04-05 NOTE — Telephone Encounter (Signed)
Pt in hospital       

## 2012-04-05 NOTE — Telephone Encounter (Signed)
TCM attempt #1

## 2012-04-07 ENCOUNTER — Other Ambulatory Visit: Payer: Self-pay | Admitting: Internal Medicine

## 2012-04-07 ENCOUNTER — Encounter: Payer: Self-pay | Admitting: Cardiovascular Disease

## 2012-04-07 ENCOUNTER — Ambulatory Visit (INDEPENDENT_AMBULATORY_CARE_PROVIDER_SITE_OTHER): Payer: Medicare Other | Admitting: Cardiovascular Disease

## 2012-04-07 ENCOUNTER — Institutional Professional Consult (permissible substitution): Payer: Medicare Other | Admitting: Cardiovascular Disease

## 2012-04-07 ENCOUNTER — Ambulatory Visit (INDEPENDENT_AMBULATORY_CARE_PROVIDER_SITE_OTHER): Payer: Medicare Other | Admitting: Internal Medicine

## 2012-04-07 ENCOUNTER — Encounter: Payer: Self-pay | Admitting: Internal Medicine

## 2012-04-07 VITALS — BP 126/56 | HR 50 | Ht 70.0 in | Wt 172.0 lb

## 2012-04-07 VITALS — BP 100/46 | HR 50 | Temp 97.7°F | Resp 14 | Wt 173.5 lb

## 2012-04-07 DIAGNOSIS — J449 Chronic obstructive pulmonary disease, unspecified: Secondary | ICD-10-CM

## 2012-04-07 DIAGNOSIS — F17200 Nicotine dependence, unspecified, uncomplicated: Secondary | ICD-10-CM

## 2012-04-07 DIAGNOSIS — E118 Type 2 diabetes mellitus with unspecified complications: Secondary | ICD-10-CM

## 2012-04-07 DIAGNOSIS — I2589 Other forms of chronic ischemic heart disease: Secondary | ICD-10-CM

## 2012-04-07 DIAGNOSIS — I25810 Atherosclerosis of coronary artery bypass graft(s) without angina pectoris: Secondary | ICD-10-CM

## 2012-04-07 DIAGNOSIS — Z716 Tobacco abuse counseling: Secondary | ICD-10-CM

## 2012-04-07 DIAGNOSIS — I10 Essential (primary) hypertension: Secondary | ICD-10-CM

## 2012-04-07 DIAGNOSIS — E1165 Type 2 diabetes mellitus with hyperglycemia: Secondary | ICD-10-CM

## 2012-04-07 DIAGNOSIS — I255 Ischemic cardiomyopathy: Secondary | ICD-10-CM

## 2012-04-07 DIAGNOSIS — I2510 Atherosclerotic heart disease of native coronary artery without angina pectoris: Secondary | ICD-10-CM

## 2012-04-07 DIAGNOSIS — Z7189 Other specified counseling: Secondary | ICD-10-CM

## 2012-04-07 DIAGNOSIS — E785 Hyperlipidemia, unspecified: Secondary | ICD-10-CM

## 2012-04-07 MED ORDER — OXYCODONE-ACETAMINOPHEN 5-325 MG PO TABS: 1.0000 | ORAL_TABLET | Freq: Four times a day (QID) | ORAL | Status: DC | PRN

## 2012-04-07 MED ORDER — OXYCODONE HCL 60 MG PO TB12: 1.0000 | ORAL_TABLET | Freq: Two times a day (BID) | ORAL | Status: DC | PRN

## 2012-04-07 MED ORDER — ISOSORBIDE MONONITRATE ER 30 MG PO TB24: 30.0000 mg | ORAL_TABLET | Freq: Every day | ORAL | Status: DC

## 2012-04-07 MED ORDER — GABAPENTIN 300 MG PO CAPS: 300.0000 mg | ORAL_CAPSULE | Freq: Every day | ORAL | Status: DC

## 2012-04-07 NOTE — Progress Notes (Signed)
HPI  Mr. 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 He also has chronic systolic heart failure. I am seeing him today for the first time. Dr.Gollan is his cardiologist and saw him recently at [facility] during his hospitalization. He was hospitalized for atypical chest pain and ruled out for myocardial infarction. He underwent a pharmacologic nuclear stress test which showed no clear ischemia. He was treated medically. While in the hospital, 3 medications were added including amlodipine 2.5 mg daily, Imdur and lisinopril 2.5 mg once daily. He has not been taking lisinopril due to concerns about possible hypotension. He is also on Coreg 12.5 mg twice daily. In the past this was decreased to 6.25 mg twice daily after he had bradycardia but increased again to 12.5 and his blood pressure and heart rate started going up. He was just discharged from the hospital 2 days ago. He denies any chest pain since hospital discharge.  Allergies  Allergen Reactions  . Amoxicillin     Sick on stomach     Current Outpatient Prescriptions on File Prior to Visit  Medication Sig Dispense Refill  . albuterol (PROAIR HFA) 108 (90 BASE) MCG/ACT inhaler Inhale 2 puffs into the lungs every 6 (six) hours as needed for wheezing or shortness of breath.  18 g  3  . aspirin 81 MG tablet Take 81 mg by mouth daily.      . citalopram (CELEXA) 20 MG tablet TAKE 1 TABLET BY MOUTH EVERY DAY  30 tablet  5  . dimenhyDRINATE (DRAMAMINE) 50 MG tablet Take 50 mg by mouth every 8 (eight) hours as needed.        . ferrous sulfate 325 (65 FE) MG EC tablet Take 325 mg by mouth daily with breakfast.        . furosemide (LASIX) 20 MG tablet Take 1 tablet (20 mg total) by mouth every other day.   30 tablet  2  . insulin aspart protamine-insulin aspart (NOVOLOG 70/30) (70-30) 100 UNIT/ML injection Inject 30 Units into the skin as directed.       . insulin glargine (LANTUS) 100 UNIT/ML injection Inject 30 Units into the skin at bedtime.       . Insulin Syringe-Needle U-100 (INSULIN SYRINGE 1CC/30GX5/16") 30G X 5/16" 1 ML MISC USE FOUR TIMES DAILY  200 each  7  . ipratropium-albuterol (DUONEB) 0.5-2.5 (3) MG/3ML SOLN Take 3 mLs by nebulization every 4 (four) hours as needed.      Marland Kitchen NEXIUM 40 MG capsule TAKE 1 CAPSULE BY MOUTH EVERY DAY  30 capsule  5  . nitroGLYCERIN (NITROSTAT) 0.4 MG SL tablet Place 1 tablet (0.4 mg total) under the tongue every 5 (five) minutes as needed.  30 tablet  6  . simvastatin (ZOCOR) 40 MG tablet TAKE 1 TABLET BY MOUTH EVERY NIGHT AT BEDTIME FOR CHOLESTEROL  30 tablet  2  . tiotropium (SPIRIVA) 18 MCG inhalation capsule Place 18 mcg into inhaler and inhale daily.      Marland Kitchen DISCONTD: gabapentin (NEURONTIN) 300 MG capsule Take 300 mg by mouth daily.       Marland Kitchen gabapentin (NEURONTIN) 300 MG capsule Take 1 capsule (300 mg total) by mouth daily.  30 capsule  3  . isosorbide mononitrate (  IMDUR) 30 MG 24 hr tablet Take 1 tablet (30 mg total) by mouth daily.  30 tablet  3  . Oxycodone HCl (OXYCONTIN) 60 MG TB12 Take 1 tablet (60 mg total) by mouth every 12 (twelve) hours as needed.  60 each  0  . oxyCODONE-acetaminophen (PERCOCET) 5-325 MG per tablet Take 1 tablet by mouth every 6 (six) hours as needed.  120 tablet  0  . DISCONTD: ferrous sulfate 325 (65 FE) MG tablet TAKE 1 TABLET BY MOUTH DAILY WITH A MEAL  90 tablet  1  . DISCONTD: furosemide (LASIX) 40 MG tablet TAKE 1 TABLET BY MOUTH EVERY DAY AS NEEDED  30 tablet  3  . DISCONTD: isosorbide mononitrate (IMDUR) 30 MG 24 hr tablet Take 30 mg by mouth daily.      Marland Kitchen DISCONTD: metoprolol tartrate (LOPRESSOR) 25 MG tablet Take 1/2 tablet by mouth twice daily  30 tablet  6     Past Medical History  Diagnosis Date  .  Hypertension   . Hyperlipidemia   . Coronary artery disease     lbbb  . PAD (peripheral artery disease)   . Diabetes mellitus     poorly controlled  . Anxiety   . Tobacco abuse   . Peripheral vascular disease     with prior stents placed bilaterally  . MI (myocardial infarction) 2000  . Anemia   . COPD exacerbation   . Acute renal failure     due to ATN versus hypovolemia  . Acute exacerbation of CHF (congestive heart failure)     EF 25%,     Past Surgical History  Procedure Date  . Coronary artery bypass graft     x4  . Stents in legs 03/2010    [facility]  . Cholecystectomy   . Cardiac catheterization 2010    [place]  . Cataract extraction   . Cataract extraction, bilateral 2013     Family History  Problem Relation Age of Onset  . Heart attack Father   . Stroke Mother      History   Social History  . Marital Status: Single    Spouse Name: N/A    Number of Children: N/A  . Years of Education: N/A   Occupational History  . Not on file.   Social History Main Topics  . Smoking status: Current Everyday Smoker -- 0.5 packs/day for 30 years    Types: Cigarettes  . Smokeless tobacco: Never Used  . Alcohol Use: No  . Drug Use: No  . Sexually Active: Not on file   Other Topics Concern  . Not on file   Social History Narrative  . No narrative on file       PHYSICAL EXAM   BP 126/56  Pulse 50  Ht 5\' 10"  (1.778 m)  Wt 172 lb (78.019 kg)  BMI 24.68 kg/m2 Constitutional: He is oriented to person, place, and time. He appears well-developed and well-nourished. No distress.  HENT: No nasal discharge.  Head: Normocephalic and atraumatic.  Eyes: Pupils are equal and round. Right eye exhibits no discharge. Left eye exhibits no discharge.  Neck: Normal range of motion. Neck supple. No JVD present. No thyromegaly present.  Cardiovascular: Normal rate, regular rhythm, normal heart sounds and. Exam reveals no gallop and no friction rub.  No murmur heard.  Pulmonary/Chest: Effort normal and breath sounds normal. No stridor. No respiratory distress. He has no wheezes. He has no rales. He exhibits no tenderness.  Abdominal: Soft. Bowel sounds are normal. He exhibits no distension. There is no tenderness. There is no rebound and no guarding.  Musculoskeletal: Normal range of motion. He exhibits no edema and no tenderness.  Neurological: He is alert and oriented to person, place, and time. Coordination normal.  Skin: Skin is warm and dry. No rash noted. He is not diaphoretic. No erythema. No pallor.  Psychiatric: He has a normal mood and affect. His behavior is normal. Judgment and thought content normal.       EKG: Sinus bradycardia with nonspecific IVCD. Heart rate is 50 beats per minute.   ASSESSMENT AND PLAN

## 2012-04-07 NOTE — Patient Instructions (Addendum)
Continue same medications. Stay off Lisinopril.  Continue to monitor blood pressure and heart rate. The heart rate should be more than 50 and less than 100.  Follow up with Dr. [name] in 4 months.

## 2012-04-07 NOTE — Assessment & Plan Note (Signed)
Continue treatment with Coreg. Given the recent addition of amlodipine and Imdur, I am hesitant to start him on an ACE inhibitor especially with his chronic kidney disease. Can consider stopping amlodipine in the future in order to be able to use an ACE inhibitor given his reduced ejection fraction.

## 2012-04-07 NOTE — Progress Notes (Signed)
Opened in error

## 2012-04-07 NOTE — Assessment & Plan Note (Signed)
His blood pressure tends to fluctuate but seems to be controlled today. He is to continue to monitor his blood pressure given the multiple additions recently including amlodipine and Imdur.

## 2012-04-07 NOTE — Telephone Encounter (Signed)
OV scheduled today 7/5, within 2 day window for TCM Will not try to contact pt again since he is coming in for OV today

## 2012-04-07 NOTE — Assessment & Plan Note (Addendum)
He is taking 20 to 30 units of 70/30 three times daily and not following a diabetic diet.,  Last A1c was over 9.  He has agreed to follow my recommendations on insulin dosing using Lantus and twice daily 70/30, and will supply a log of blood sugars for the next week so I can make adjustments.  Low glycemic index diet discussed and advised.

## 2012-04-07 NOTE — Progress Notes (Signed)
Patient ID: Patrick [name], male   DOB: [birth year], 71 y.o.   MRN: [id number] Patient Active Problem List  Diagnosis  . HYPERLIPIDEMIA-MIXED  . TOBACCO ABUSE  . HYPERTENSION, BENIGN  . CAD, AUTOLOGOUS BYPASS GRAFT  . EDEMA  . Diabetes mellitus type 2 with complications, uncontrolled  . Anxiety  . Peripheral vascular disease  . Anemia  . Cardiomyopathy, ischemic  . Tobacco abuse counseling    Subjective:  CC:   Chief Complaint  Patient presents with  . Follow-up    hospital    HPI:   Patrick [name] a 71 y.o. male who presents for hospital followup.  Was admitted with sudden onset of chest pain on Monday May 6 and treated for acute AMI .  Seen by  Dr.  [name], had a stress tes, because the offered Cardiac cath was deferred by patient. Stress test was reportedly normal but [facility] not currently accessible.  Was discharged home last Wednesday .  The week prior to admission he  took additional diuretics for weight gain of 4 lbs (40 mg was prescribed,  He increased to 80 mg daily  For four days) , then started feeling weak and jittery and weight gain continued until he developed chest pain and was admitted.   Since discharge his weight has been stable at 160 to 161 , on 20 mg lasix. .  But he reports malaise.  He has resumed smoking.    Past Medical History  Diagnosis Date  . Hypertension   . Hyperlipidemia   . Coronary artery disease     lbbb  . PAD (peripheral artery disease)   . Diabetes mellitus     poorly controlled  . Anxiety   . Tobacco abuse   . Peripheral vascular disease     with prior stents placed bilaterally  . MI (myocardial infarction) 2000  . Anemia   . COPD exacerbation   . Acute renal failure     due to ATN versus hypovolemia  . Acute exacerbation of CHF (congestive heart failure)     EF 25%,    Past Surgical History  Procedure Date  . Coronary artery bypass graft     x4  . Stents in legs 03/2010    [facility]  . Cholecystectomy   .  Cardiac catheterization 2010    [place]  . Cataract extraction   . Cataract extraction, bilateral 2013    The following portions of the patient's history were reviewed and updated as appropriate: Allergies, current medications, and problem list.    Review of Systems:   12 Pt  review of systems was negative except those addressed in the HPI,     History   Social History  . Marital Status: Single    Spouse Name: N/A    Number of Children: N/A  . Years of Education: N/A   Occupational History  . Not on file.   Social History Main Topics  . Smoking status: Current Everyday Smoker -- 0.5 packs/day for 30 years    Types: Cigarettes  . Smokeless tobacco: Never Used  . Alcohol Use: No  . Drug Use: No  . Sexually Active: Not on file   Other Topics Concern  . Not on file   Social History Narrative  . No narrative on file    Objective:  BP 100/46  Pulse 50  Temp 97.7 F (36.5 C) (Oral)  Resp 14  Wt 173 lb 8 oz (78.699 kg)  SpO2 98%  General appearance: alert, cooperative and appears stated age Ears: normal TM's and external ear canals both ears Throat: lips, mucosa, and tongue normal; teeth and gums normal Neck: no adenopathy, no carotid bruit, supple, symmetrical, trachea midline and thyroid not enlarged, symmetric, no tenderness/mass/nodules Back: symmetric, no curvature. ROM normal. No CVA tenderness. Lungs: clear to auscultation bilaterally Heart: regular rate and rhythm, S1, S2 normal, no murmur, click, rub or gallop Abdomen: soft, non-tender; bowel sounds normal; no masses,  no organomegaly Pulses: 2+ and symmetric Skin: Skin color, texture, turgor normal. No rashes or lesions Lymph nodes: Cervical, supraclavicular, and axillary nodes normal.  Assessment and Plan:  Diabetes mellitus type 2 with complications, uncontrolled He is taking 20 to 30 units of 70/30 three times daily and not following a diabetic diet.,  Last A1c was over 9.  He has agreed  to follow my recommendations on insulin dosing using Lantus and twice daily 70/30, and will supply a log of blood sugars for the next week so I can make adjustments.  Low glycemic index diet discussed and advised.   Tobacco abuse counseling counseling  Again given, risks of continued use, specifically, decreased perfusion of all major organs and LE described in detail.   Cardiomyopathy, ischemic His is taking the appropriate medications and is asymptomatic at rest.   HYPERTENSION, BENIGN I have suspended the amlodipine in favor of increasing the Imdur.  His HR is already 50 so the beta blocker cannot be titrated upward,   Given his multiple chronic comobidities and frequent hospitlaization s( 3 this year, at least) I will refer to [facility] to assist home management of diabetes,  Heart failure, and COPD.    Updated Medication List Outpatient Encounter Prescriptions as of 04/07/2012  Medication Sig Dispense Refill  . albuterol (PROAIR HFA) 108 (90 BASE) MCG/ACT inhaler Inhale 2 puffs into the lungs every 6 (six) hours as needed for wheezing or shortness of breath.  18 g  3  . aspirin 81 MG tablet Take 81 mg by mouth daily.      . carvedilol (COREG) 12.5 MG tablet Take 12.5 mg by mouth 2 (two) times daily with a meal.      . citalopram (CELEXA) 20 MG tablet TAKE 1 TABLET BY MOUTH EVERY DAY  30 tablet  5  . dimenhyDRINATE (DRAMAMINE) 50 MG tablet Take 50 mg by mouth every 8 (eight) hours as needed.        . docusate (COLACE) 50 MG/5ML liquid as needed. 3 drops in each ear daily to soften earwax      . ferrous sulfate 325 (65 FE) MG EC tablet Take 325 mg by mouth daily with breakfast.        . furosemide (LASIX) 20 MG tablet Take 1 tablet (20 mg total) by mouth every other day.  30 tablet  2  . gabapentin (NEURONTIN) 300 MG capsule Take 1 capsule (300 mg total) by mouth daily.  30 capsule  3  . insulin aspart protamine-insulin aspart (NOVOLOG 70/30) (70-30) 100 UNIT/ML injection Inject  30 Units into the skin as directed.       . insulin glargine (LANTUS) 100 UNIT/ML injection Inject 30 Units into the skin at bedtime.       . Insulin Syringe-Needle U-100 (INSULIN SYRINGE 1CC/30GX5/16") 30G X 5/16" 1 ML MISC USE FOUR TIMES DAILY  200 each  7  . ipratropium-albuterol (DUONEB) 0.5-2.5 (3) MG/3ML SOLN Take 3 mLs by nebulization every 4 (four) hours as needed.      Marland Kitchen  isosorbide mononitrate (IMDUR) 30 MG 24 hr tablet Take 1 tablet (30 mg total) by mouth daily.  30 tablet  3  . LORazepam (ATIVAN) 0.5 MG tablet Take 0.5 mg by mouth as needed.      Marland Kitchen NEXIUM 40 MG capsule TAKE 1 CAPSULE BY MOUTH EVERY DAY  30 capsule  5  . nitroGLYCERIN (NITROSTAT) 0.4 MG SL tablet Place 1 tablet (0.4 mg total) under the tongue every 5 (five) minutes as needed.  30 tablet  6  . Oxycodone HCl (OXYCONTIN) 60 MG TB12 Take 1 tablet (60 mg total) by mouth every 12 (twelve) hours as needed.  60 each  0  . oxyCODONE-acetaminophen (PERCOCET) 5-325 MG per tablet Take 1 tablet by mouth every 6 (six) hours as needed.  120 tablet  0  . simvastatin (ZOCOR) 40 MG tablet TAKE 1 TABLET BY MOUTH EVERY NIGHT AT BEDTIME FOR CHOLESTEROL  30 tablet  2  . tiotropium (SPIRIVA) 18 MCG inhalation capsule Place 18 mcg into inhaler and inhale daily.      Marland Kitchen DISCONTD: amLODipine (NORVASC) 2.5 MG tablet Take 2.5 mg by mouth daily.      Marland Kitchen DISCONTD: gabapentin (NEURONTIN) 300 MG capsule Take 300 mg by mouth daily.       Marland Kitchen DISCONTD: isosorbide mononitrate (IMDUR) 30 MG 24 hr tablet Take 30 mg by mouth daily.      Marland Kitchen DISCONTD: Oxycodone HCl (OXYCONTIN) 60 MG TB12 Take 1 tablet (60 mg total) by mouth every 12 (twelve) hours as needed.  60 each  0  . DISCONTD: oxyCODONE-acetaminophen (PERCOCET) 5-325 MG per tablet Take 1 tablet by mouth every 6 (six) hours as needed.  120 tablet  0     Orders Placed This Encounter  Procedures  . Hemoglobin A1c  . Basic metabolic panel  . Basic metabolic panel  . Hemoglobin A1c    Return in about 1  month (around 05/08/2012).

## 2012-04-07 NOTE — Patient Instructions (Addendum)
Suspend amlodipine.  If bp begins to stay above 130,  Increase the isosorbide to 60 mg daily  Send me a week's worth of blood sugars plus the amount of insulin your are giving yourself at each meal so I can arrange a more therapeutic schedule of twice daily insulin dosing.

## 2012-04-07 NOTE — Assessment & Plan Note (Signed)
Recent hospitalization for atypical chest pain. Myocardial infarction was ruled out. Nuclear stress test showed no evidence of ischemia. Medical therapy was recommended. Long-acting nitroglycerin was added as well as amlodipine.

## 2012-04-08 LAB — BASIC METABOLIC PANEL
CO2: 25 mEq/L (ref 19–32)
Calcium: 9.4 mg/dL (ref 8.4–10.5)
Chloride: 104 mEq/L (ref 96–112)
Creat: 1.66 mg/dL — ABNORMAL HIGH (ref 0.50–1.35)
Glucose, Bld: 122 mg/dL — ABNORMAL HIGH (ref 70–99)
Sodium: 141 mEq/L (ref 135–145)

## 2012-04-09 ENCOUNTER — Encounter: Payer: Self-pay | Admitting: Internal Medicine

## 2012-04-09 NOTE — Assessment & Plan Note (Signed)
counseling  Again given, risks of continued use, specifically, decreased perfusion of all major organs and LE described in detail.

## 2012-04-09 NOTE — Assessment & Plan Note (Signed)
I have suspended the amlodipine in favor of increasing the Imdur.  His HR is already 50 so the beta blocker cannot be titrated upward,

## 2012-04-09 NOTE — Assessment & Plan Note (Signed)
His is taking the appropriate medications and is asymptomatic at rest.

## 2012-04-12 ENCOUNTER — Encounter: Payer: Self-pay | Admitting: Cardiovascular Disease

## 2012-04-13 ENCOUNTER — Emergency Department: Payer: Self-pay | Admitting: Emergency Medicine

## 2012-04-13 LAB — COMPREHENSIVE METABOLIC PANEL
Alkaline Phosphatase: 113 U/L (ref 50–136)
Anion Gap: 7 (ref 7–16)
BUN: 32 mg/dL — ABNORMAL HIGH (ref 7–18)
Bilirubin,Total: 0.2 mg/dL (ref 0.2–1.0)
Chloride: 104 mmol/L (ref 98–107)
Creatinine: 1.89 mg/dL — ABNORMAL HIGH (ref 0.60–1.30)
EGFR (African American): 41 — ABNORMAL LOW
EGFR (Non-African Amer.): 35 — ABNORMAL LOW
Osmolality: 295 (ref 275–301)
SGOT(AST): 16 U/L (ref 15–37)
Sodium: 139 mmol/L (ref 136–145)
Total Protein: 7.5 g/dL (ref 6.4–8.2)

## 2012-04-13 LAB — CBC
HCT: 35.6 % — ABNORMAL LOW (ref 40.0–52.0)
HGB: 11 g/dL — ABNORMAL LOW (ref 13.0–18.0)
MCH: 26.6 pg (ref 26.0–34.0)
MCHC: 30.9 g/dL — ABNORMAL LOW (ref 32.0–36.0)
MCV: 86 fL (ref 80–100)
RDW: 15.8 % — ABNORMAL HIGH (ref 11.5–14.5)
WBC: 9.9 10*3/uL (ref 3.8–10.6)

## 2012-04-13 LAB — ETHANOL
Ethanol %: <0.003 % (ref 0.000–0.080)
Ethanol: <3 mg/dL

## 2012-04-14 ENCOUNTER — Ambulatory Visit (INDEPENDENT_AMBULATORY_CARE_PROVIDER_SITE_OTHER): Payer: Medicare Other | Admitting: Internal Medicine

## 2012-04-14 ENCOUNTER — Telehealth: Payer: Self-pay | Admitting: Internal Medicine

## 2012-04-14 ENCOUNTER — Encounter: Payer: Self-pay | Admitting: Internal Medicine

## 2012-04-14 VITALS — BP 106/56 | HR 58 | Temp 97.8°F | Resp 14 | Wt 165.5 lb

## 2012-04-14 DIAGNOSIS — E875 Hyperkalemia: Secondary | ICD-10-CM

## 2012-04-14 DIAGNOSIS — F419 Anxiety disorder, unspecified: Secondary | ICD-10-CM

## 2012-04-14 DIAGNOSIS — E118 Type 2 diabetes mellitus with unspecified complications: Secondary | ICD-10-CM

## 2012-04-14 DIAGNOSIS — F329 Major depressive disorder, single episode, unspecified: Secondary | ICD-10-CM

## 2012-04-14 DIAGNOSIS — F411 Generalized anxiety disorder: Secondary | ICD-10-CM

## 2012-04-14 DIAGNOSIS — E1165 Type 2 diabetes mellitus with hyperglycemia: Secondary | ICD-10-CM

## 2012-04-14 LAB — BASIC METABOLIC PANEL
BUN: 30 mg/dL — ABNORMAL HIGH (ref 6–23)
CO2: 27 mEq/L (ref 19–32)
GFR: 43.86 mL/min — ABNORMAL LOW (ref >60.00)
Glucose, Bld: 204 mg/dL — ABNORMAL HIGH (ref 70–99)
Potassium: 4.6 mEq/L (ref 3.5–5.1)

## 2012-04-14 NOTE — Patient Instructions (Addendum)
Suspend the noon dose of lorazepam for now , unless you feel shaky without it.   Decrease the evening dose to 1/2 tablet unless you can't sleep without a full tablet.  If the depression is no better in one week,  We will change the citalopram to mirtazipine at bedtime   Do not skip the evening dose of 70/30 unless you are skipping dinner .  If you skip dinner,  Give yourself the Lantus every day no matter what, even if you don't eat.   If you skip a meal, you should still take your 70/30 but decrease dose to 5 units minimum unless your sugar is < 120  Do no take the morning dose of 70/30 until you eat. Increase it to 25 units every morning and 25 units every evening unless you do not eat.    So all doses shoyuld be 25 for now except if you don't eat dinner   I will call you if you need to take the liquid medicine to lower your potassium

## 2012-04-14 NOTE — Telephone Encounter (Signed)
Patient went to ER yesterday and he left without being seen, he was complaining with dizziness over a week, he also stated he was depressed.  She states that they did lab work on him and they are faxing those over, and she knew he had an appointment today with Dr. Darrick Huntsman.  She also said that when they asked about harming himself he didn't make any comment.  She wanted you to be aware of this information.

## 2012-04-16 DIAGNOSIS — E1122 Type 2 diabetes mellitus with diabetic chronic kidney disease: Secondary | ICD-10-CM | POA: Insufficient documentation

## 2012-04-16 DIAGNOSIS — E875 Hyperkalemia: Secondary | ICD-10-CM | POA: Insufficient documentation

## 2012-04-16 NOTE — Assessment & Plan Note (Signed)
Secondary to hypertension and diabetes with recent deterioration after his cardiac catheterization. Kidney function has improved slightly. He needs to follow a low potassium diet given his recurrent hyperkalemia.

## 2012-04-16 NOTE — Assessment & Plan Note (Addendum)
Next is depression. His current symptoms are predominantly depression. He has been using lorazepam as scheduled twice daily dose rather than as needed. I recommended that he suspend the twice daily lorazepam and to  continue the citalopram.  He is advised to use the lorazepam only if he feels axnious. If his symptoms have not improved when he returns in one month we will consider changing the citalopram to Remeron at bedtime.

## 2012-04-16 NOTE — Progress Notes (Signed)
Patient ID: Patrick [name], male   DOB: [birth year], 71 y.o.   MRN: [id number] Patient Active Problem List  Diagnosis  . HYPERLIPIDEMIA-MIXED  . TOBACCO ABUSE  . HYPERTENSION, BENIGN  . CAD, AUTOLOGOUS BYPASS GRAFT  . EDEMA  . Diabetes mellitus type 2 with complications, uncontrolled  . Anxiety  . Peripheral vascular disease  . Anemia  . Cardiomyopathy, ischemic  . Tobacco abuse counseling  . Hyperkalemia  . Chronic kidney disease (CKD), stage III (moderate)    Subjective:  CC:   Chief Complaint  Patient presents with  . Depression    HPI:   Patrick [name] a 71 y.o. male who presents ER followup for July 11 visit to ER for weakness and depression. No cardiac evaluation was done CBC and being that were done which showed uncontrolled diabetes and mild anemia. Patient has had a difficult time after his last hospitalization in May for ST segment elevation MI which was managed medically due to the diffuse disease and history of four-vessel CABG. He has chronic pain, chronic dyspnea from years of tobacco abuse, and peripheral vascular disease. His quality of life is not very good right now. He has made comments to his daughter that suggests moderate to severe underlying depression without suicidal ideation. He is seen no effect so far with the citalopram. He is taking the lorazepam twice daily on a scheduled basis instead of waiting for anxiety symptoms. He is sleeping excessively. Currently his pain is well-controlled with narcotics he is not having any chest pain. He does not feel presyncopal. He has not incorporated the changes to his insulin regimen that were recommended last week. He continues to use 70/30 insulin twice daily along with Lantus but  has continued to adjust his doses based on a sliding scale which I discontinued last week. He has skipped his insulin doses at times due to poor appetite.   He continues to have elevated blood sugars and infrequent lows.   Past Medical History    Diagnosis Date  . Hypertension   . Hyperlipidemia   . Coronary artery disease     lbbb  . PAD (peripheral artery disease)   . Diabetes mellitus     poorly controlled  . Anxiety   . Tobacco abuse   . Peripheral vascular disease     with prior stents placed bilaterally  . MI (myocardial infarction) 2000  . Anemia   . COPD exacerbation   . Acute renal failure     due to ATN versus hypovolemia  . Acute exacerbation of CHF (congestive heart failure)     EF 25%,    Past Surgical History  Procedure Date  . Coronary artery bypass graft     x4  . Stents in legs 03/2010    [facility]  . Cholecystectomy   . Cardiac catheterization 2010    [place]  . Cataract extraction   . Cataract extraction, bilateral 2013         The following portions of the patient's history were reviewed and updated as appropriate: Allergies, current medications, and problem list.    Review of Systems:  Conmprehensive  review of systems was negative except those addressed in the HPI,     History   Social History  . Marital Status: Single    Spouse Name: N/A    Number of Children: N/A  . Years of Education: N/A   Occupational History  . Not on file.   Social History Main Topics  .  Smoking status: Current Everyday Smoker -- 0.5 packs/day for 30 years    Types: Cigarettes  . Smokeless tobacco: Never Used  . Alcohol Use: No  . Drug Use: No  . Sexually Active: Not on file   Other Topics Concern  . Not on file   Social History Narrative  . No narrative on file    Objective:  BP 106/56  Pulse 58  Temp 97.8 F (36.6 C) (Oral)  Resp 14  Wt 165 lb 8 oz (75.07 kg)  SpO2 97%  General appearance: alert, cooperative and appears stated age Ears: normal TM's and external ear canals both ears Throat: lips, mucosa, and tongue normal; teeth and gums normal Neck: no adenopathy, no carotid bruit, supple, symmetrical, trachea midline and thyroid not enlarged,  symmetric, no tenderness/mass/nodules Back: symmetric, no curvature. ROM normal. No CVA tenderness. Lungs: clear to auscultation bilaterally Heart: regular rate and rhythm, S1, S2 normal, no murmur, click, rub or gallop Abdomen: soft, non-tender; bowel sounds normal; no masses,  no organomegaly Pulses: 2+ and symmetric Skin: Skin color, texture, turgor normal. No rashes or lesions Lymph nodes: Cervical, supraclavicular, and axillary nodes normal.  Assessment and Plan:  Diabetes mellitus type 2 with complications, uncontrolled I again reviewed proper use of insulin. I have adjusted his doses of 70/30 and recommended that he not sustain them as he has been doing when he needs it his appetite is poor. I have recommended that he continue at a minimum of 5 units evening he skips a meal. Continue Lantus 25 units and his daughter we'll send me weekly electrolytes being L.  Anxiety Next is depression. His current symptoms are predominantly depression. He has been using lorazepam as scheduled twice daily dose rather than as needed. I recommended that he suspend the twice daily lorazepam and to  continue the citalopram.  He is advised to use the lorazepam only if he feels axnious. If his symptoms have not improved when he returns in one month we will consider changing the citalopram to Remeron at bedtime.  Hyperkalemia Secondary to mild renal insufficiency with periodic elevations managed with Kayexalate. His labs from July 11 the ER visit were notable for hyperkalemia, potassium 5.4, and renal insufficiency, creatinine 1.89. Repeat creatinine today was 1.7 which is improved and his potassium was 4.6. I have given him copies of 2 lists one of low potassium foods a lot of high potassium foods and instructed him on how to adjust his diet  Chronic kidney disease (CKD), stage III (moderate) Secondary to hypertension and diabetes with recent deterioration after his cardiac catheterization. Kidney function has  improved slightly. He needs to follow a low potassium diet given his recurrent hyperkalemia.   Updated Medication List Outpatient Encounter Prescriptions as of 04/14/2012  Medication Sig Dispense Refill  . albuterol (PROAIR HFA) 108 (90 BASE) MCG/ACT inhaler Inhale 2 puffs into the lungs every 6 (six) hours as needed for wheezing or shortness of breath.  18 g  3  . aspirin 81 MG tablet Take 81 mg by mouth daily.      . carvedilol (COREG) 12.5 MG tablet Take 12.5 mg by mouth 2 (two) times daily with a meal.      . citalopram (CELEXA) 20 MG tablet TAKE 1 TABLET BY MOUTH EVERY DAY  30 tablet  5  . dimenhyDRINATE (DRAMAMINE) 50 MG tablet Take 50 mg by mouth every 8 (eight) hours as needed.        . docusate (COLACE) 50 MG/5ML liquid as  needed. 3 drops in each ear daily to soften earwax      . ferrous sulfate 325 (65 FE) MG EC tablet Take 325 mg by mouth daily with breakfast.        . furosemide (LASIX) 20 MG tablet Take 1 tablet (20 mg total) by mouth every other day.  30 tablet  2  . gabapentin (NEURONTIN) 300 MG capsule Take 1 capsule (300 mg total) by mouth daily.  30 capsule  3  . insulin aspart protamine-insulin aspart (NOVOLOG 70/30) (70-30) 100 UNIT/ML injection Inject 30 Units into the skin as directed.       . insulin glargine (LANTUS) 100 UNIT/ML injection Inject 30 Units into the skin at bedtime.       . Insulin Syringe-Needle U-100 (INSULIN SYRINGE 1CC/30GX5/16") 30G X 5/16" 1 ML MISC USE FOUR TIMES DAILY  200 each  7  . ipratropium-albuterol (DUONEB) 0.5-2.5 (3) MG/3ML SOLN Take 3 mLs by nebulization every 4 (four) hours as needed.      . isosorbide mononitrate (IMDUR) 30 MG 24 hr tablet Take 1 tablet (30 mg total) by mouth daily.  30 tablet  3  . LORazepam (ATIVAN) 0.5 MG tablet Take 0.5 mg by mouth as needed.      Marland Kitchen NEXIUM 40 MG capsule TAKE 1 CAPSULE BY MOUTH EVERY DAY  30 capsule  5  . nitroGLYCERIN (NITROSTAT) 0.4 MG SL tablet Place 1 tablet (0.4 mg total) under the tongue every 5  (five) minutes as needed.  30 tablet  6  . Oxycodone HCl (OXYCONTIN) 60 MG TB12 Take 1 tablet (60 mg total) by mouth every 12 (twelve) hours as needed.  60 each  0  . oxyCODONE-acetaminophen (PERCOCET) 5-325 MG per tablet Take 1 tablet by mouth every 6 (six) hours as needed.  120 tablet  0  . simvastatin (ZOCOR) 40 MG tablet TAKE 1 TABLET BY MOUTH EVERY NIGHT AT BEDTIME FOR CHOLESTEROL  30 tablet  2  . tiotropium (SPIRIVA) 18 MCG inhalation capsule Place 18 mcg into inhaler and inhale daily.         Orders Placed This Encounter  Procedures  . Basic metabolic panel    No Follow-up on file.

## 2012-04-16 NOTE — Assessment & Plan Note (Addendum)
Secondary to mild renal insufficiency with periodic elevations managed with Kayexalate. His labs from July 11 the ER visit were notable for hyperkalemia, potassium 5.4, and renal insufficiency, creatinine 1.89. Repeat creatinine today was 1.7 which is improved and his potassium was 4.6. I have given him copies of 2 lists one of low potassium foods a lot of high potassium foods and instructed him on how to adjust his diet

## 2012-04-16 NOTE — Assessment & Plan Note (Signed)
I again reviewed proper use of insulin. I have adjusted his doses of 70/30 and recommended that he not sustain them as he has been doing when he needs it his appetite is poor. I have recommended that he continue at a minimum of 5 units evening he skips a meal. Continue Lantus 25 units and his daughter we'll send me weekly electrolytes being L.

## 2012-04-17 ENCOUNTER — Encounter: Payer: Self-pay | Admitting: Internal Medicine

## 2012-04-18 ENCOUNTER — Other Ambulatory Visit: Payer: Self-pay | Admitting: Internal Medicine

## 2012-04-18 NOTE — Telephone Encounter (Signed)
Patient was seen in office since this message was left.

## 2012-04-19 ENCOUNTER — Telehealth: Payer: Self-pay | Admitting: *Deleted

## 2012-04-19 NOTE — Telephone Encounter (Signed)
RN Case manager called about fax that was sent on 04/05/2012 for orders for pt for Personal Care that they haven't received yet. Informed RN case manager that form may have been faxed back but hasn't been scanned into Epic yet. Advised RN case manager to refax order so that Dr. Darrick Huntsman can review and sign.

## 2012-04-24 ENCOUNTER — Emergency Department: Payer: Self-pay | Admitting: Emergency Medicine

## 2012-05-04 ENCOUNTER — Other Ambulatory Visit: Payer: Self-pay | Admitting: Internal Medicine

## 2012-05-10 ENCOUNTER — Encounter: Payer: Self-pay | Admitting: Internal Medicine

## 2012-05-10 ENCOUNTER — Ambulatory Visit (INDEPENDENT_AMBULATORY_CARE_PROVIDER_SITE_OTHER): Payer: Medicare Other | Admitting: Internal Medicine

## 2012-05-10 VITALS — BP 122/60 | HR 56 | Temp 98.0°F | Resp 16 | Wt 178.0 lb

## 2012-05-10 DIAGNOSIS — F419 Anxiety disorder, unspecified: Secondary | ICD-10-CM

## 2012-05-10 DIAGNOSIS — F418 Other specified anxiety disorders: Secondary | ICD-10-CM

## 2012-05-10 DIAGNOSIS — F341 Dysthymic disorder: Secondary | ICD-10-CM

## 2012-05-10 DIAGNOSIS — F411 Generalized anxiety disorder: Secondary | ICD-10-CM

## 2012-05-10 DIAGNOSIS — E1165 Type 2 diabetes mellitus with hyperglycemia: Secondary | ICD-10-CM

## 2012-05-10 DIAGNOSIS — E118 Type 2 diabetes mellitus with unspecified complications: Secondary | ICD-10-CM

## 2012-05-10 DIAGNOSIS — IMO0002 Reserved for concepts with insufficient information to code with codable children: Secondary | ICD-10-CM

## 2012-05-10 DIAGNOSIS — I10 Essential (primary) hypertension: Secondary | ICD-10-CM

## 2012-05-10 MED ORDER — OXYCODONE HCL 60 MG PO TB12: 1.0000 | ORAL_TABLET | Freq: Two times a day (BID) | ORAL | Status: DC | PRN

## 2012-05-10 MED ORDER — MECLIZINE HCL 12.5 MG PO TABS: 12.5000 mg | ORAL_TABLET | Freq: Three times a day (TID) | ORAL | Status: AC | PRN

## 2012-05-10 MED ORDER — OXYCODONE-ACETAMINOPHEN 5-325 MG PO TABS: 1.0000 | ORAL_TABLET | Freq: Four times a day (QID) | ORAL | Status: DC | PRN

## 2012-05-10 MED ORDER — MIRTAZAPINE 15 MG PO TBDP: 15.0000 mg | ORAL_TABLET | Freq: Every day | ORAL | Status: DC

## 2012-05-10 NOTE — Progress Notes (Signed)
Patient ID: Patrick [name], male   DOB: [birth year], 71 y.o.   MRN: [id number]    Patient Active Problem List  Diagnosis  . HYPERLIPIDEMIA-MIXED  . TOBACCO ABUSE  . HYPERTENSION, BENIGN  . CAD, AUTOLOGOUS BYPASS GRAFT  . EDEMA  . Diabetes mellitus type 2 with complications, uncontrolled  . Anxiety  . Peripheral vascular disease  . Anemia  . Cardiomyopathy, ischemic  . Tobacco abuse counseling  . Hyperkalemia  . Chronic kidney disease (CKD), stage III (moderate)  . Depression with anxiety    Subjective:  CC:   Chief Complaint  Patient presents with  . Follow-up    HPI:   Patrick [name] a 71 y.o. male who presents Follow up on depression and uncontrolled diabetes.  At last visit one month ago we discussed his uncontrolled diabetes and the need for him to resume a regular schedule of insulin with daily doses of mixed insulin using  The samples we had provided for him as well as a daily dose of Lantus. Given his erratic sleeping habits he was advised on how to adjust his insulin on days when  he oversleeps and misses breakfast .  his blood sugar log suggests that his morning glucoses are improved but he continues to have elevated sugars in the 260-300 range in the evening. This is because he did not increase his 7525 dose of insulin from 20-25 as I had suggested.   We also discussed his use of benzodiazepines  and  reduced his use from  twice daily scheduled to  to use when needed . In the interim he has tapered himself off of lorazepam completely;  however he also stopped  the citalopram. He had cataract surgery on left eye June 11,  which failed to improve his vision because it was complicated by   eye infection .  his appetite has improved somewhat but his energy level remains low .  His affect does appear to be better today      Past Medical History  Diagnosis Date  . Hypertension   . Hyperlipidemia   . Coronary artery disease     lbbb  . PAD (peripheral artery disease)   .  Diabetes mellitus     poorly controlled  . Anxiety   . Tobacco abuse   . Peripheral vascular disease     with prior stents placed bilaterally  . MI (myocardial infarction) 2000  . Anemia   . COPD exacerbation   . Acute renal failure     due to ATN versus hypovolemia  . Acute exacerbation of CHF (congestive heart failure)     EF 25%,    Past Surgical History  Procedure Date  . Coronary artery bypass graft     x4  . Stents in legs 03/2010    [facility]  . Cholecystectomy   . Cardiac catheterization 2010    [place] [facility]  . Cataract extraction   . Cataract extraction, bilateral 2013         The following portions of the patient's history were reviewed and updated as appropriate: Allergies, current medications, and problem list.    Review of Systems:   12 Pt  review of systems was negative except those addressed in the HPI,     History   Social History  . Marital Status: Single    Spouse Name: N/A    Number of Children: N/A  . Years of Education: N/A   Occupational History  . Not on file.  Social History Main Topics  . Smoking status: Current Everyday Smoker -- 0.5 packs/day for 30 years    Types: Cigarettes  . Smokeless tobacco: Never Used  . Alcohol Use: No  . Drug Use: No  . Sexually Active: Not on file   Other Topics Concern  . Not on file   Social History Narrative  . No narrative on file    Objective:  BP 122/60  Pulse 56  Temp 98 F (36.7 C) (Oral)  Resp 16  Wt 178 lb (80.74 kg)  SpO2 96%  General appearance: alert, cooperative and appears stated age Ears: normal TM's and external ear canals both ears Throat: lips, mucosa, and tongue normal; teeth and gums normal Neck: no adenopathy, no carotid bruit, supple, symmetrical, trachea midline and thyroid not enlarged, symmetric, no tenderness/mass/nodules Back: symmetric, no curvature. ROM normal. No CVA tenderness. Lungs: clear to auscultation bilaterally Heart:  regular rate and rhythm, S1, S2 normal, no murmur, click, rub or gallop Abdomen: soft, non-tender; bowel sounds normal; no masses,  no organomegaly Pulses: 2+ and symmetric Skin: Skin color, texture, turgor normal. No rashes or lesions Lymph nodes: Cervical, supraclavicular, and axillary nodes normal.  Assessment and Plan:  Diabetes mellitus type 2 with complications, uncontrolled Again we. The need to stop adjusting his medications on his own but to let me make the decisions. I've emphasized the need for consistency in in dosing before making adjustments. His dose will be 25 units twice daily of the mixed insulin. He was blood sugars to me in one week will make adjustments. The Lantus dose has not changed .   Anxiety He has discontinued all lorazepam and hyssop citalopram as well. He does seem less sedated today and less anxious.  HYPERTENSION, BENIGN He has had several days of elevated blood pressures in the afternoon notably systolics in the 170s to 100 range. We will increase his morning dose of of beta blocker from half a tablet to one whole tablet.  Depression with anxiety He has discontinued his citalopram and although I would like him to resume it, he has it in his mind that this was contributing to his depressed state. Therefore we will discontinue it permanently and begin mirtazapine 15 mg at bedtime.   Updated Medication List Outpatient Encounter Prescriptions as of 05/10/2012  Medication Sig Dispense Refill  . albuterol (PROAIR HFA) 108 (90 BASE) MCG/ACT inhaler Inhale 2 puffs into the lungs every 6 (six) hours as needed for wheezing or shortness of breath.  18 g  3  . aspirin 81 MG tablet Take 81 mg by mouth daily.      . carvedilol (COREG) 12.5 MG tablet Take 12.5 mg by mouth 2 (two) times daily with a meal.      . dimenhyDRINATE (DRAMAMINE) 50 MG tablet Take 50 mg by mouth every 8 (eight) hours as needed.        . ferrous sulfate 325 (65 FE) MG EC tablet Take 325 mg by mouth  daily with breakfast.        . furosemide (LASIX) 20 MG tablet Take 1 tablet (20 mg total) by mouth every other day.  30 tablet  2  . gabapentin (NEURONTIN) 300 MG capsule Take 1 capsule (300 mg total) by mouth daily.  30 capsule  3  . insulin aspart protamine-insulin aspart (NOVOLOG 70/30) (70-30) 100 UNIT/ML injection Inject 20 Units into the skin as directed.       . insulin glargine (LANTUS) 100 UNIT/ML injection Inject 40  Units into the skin at bedtime.       . Insulin Syringe-Needle U-100 (INSULIN SYRINGE 1CC/30GX5/16") 30G X 5/16" 1 ML MISC USE FOUR TIMES DAILY  200 each  7  . ipratropium-albuterol (DUONEB) 0.5-2.5 (3) MG/3ML SOLN Take 3 mLs by nebulization every 4 (four) hours as needed.      . isosorbide mononitrate (IMDUR) 30 MG 24 hr tablet Take 1 tablet (30 mg total) by mouth daily.  30 tablet  3  . LORazepam (ATIVAN) 0.5 MG tablet Take 0.5 mg by mouth as needed.      Marland Kitchen NEXIUM 40 MG capsule TAKE 1 CAPSULE BY MOUTH EVERY DAY  30 capsule  5  . nitroGLYCERIN (NITROSTAT) 0.4 MG SL tablet Place 1 tablet (0.4 mg total) under the tongue every 5 (five) minutes as needed.  30 tablet  6  . Oxycodone HCl (OXYCONTIN) 60 MG TB12 Take 1 tablet (60 mg total) by mouth every 12 (twelve) hours as needed.  60 each  0  . oxyCODONE-acetaminophen (PERCOCET/ROXICET) 5-325 MG per tablet Take 1 tablet by mouth every 6 (six) hours as needed.  120 tablet  0  . simvastatin (ZOCOR) 40 MG tablet TAKE 1 TABLET BY MOUTH EVERY NIGHT AT BEDTIME FOR CHOLESTEROL  30 tablet  6  . tiotropium (SPIRIVA) 18 MCG inhalation capsule Place 18 mcg into inhaler and inhale daily.      Marland Kitchen DISCONTD: Oxycodone HCl (OXYCONTIN) 60 MG TB12 Take 1 tablet (60 mg total) by mouth every 12 (twelve) hours as needed.  60 each  0  . DISCONTD: Oxycodone HCl (OXYCONTIN) 60 MG TB12 Take 1 tablet (60 mg total) by mouth every 12 (twelve) hours as needed.  60 each  0  . DISCONTD: oxyCODONE-acetaminophen (PERCOCET) 5-325 MG per tablet Take 1 tablet by  mouth every 6 (six) hours as needed.  120 tablet  0  . DISCONTD: oxyCODONE-acetaminophen (PERCOCET/ROXICET) 5-325 MG per tablet Take 1 tablet by mouth every 6 (six) hours as needed.  120 tablet  0  . meclizine (ANTIVERT) 12.5 MG tablet Take 1 tablet (12.5 mg total) by mouth 3 (three) times daily as needed.  90 tablet  3  . mirtazapine (REMERON SOL-TAB) 15 MG disintegrating tablet Take 1 tablet (15 mg total) by mouth at bedtime.  30 tablet  3  . DISCONTD: citalopram (CELEXA) 20 MG tablet TAKE 1 TABLET BY MOUTH EVERY DAY  30 tablet  5  . DISCONTD: docusate (COLACE) 50 MG/5ML liquid as needed. 3 drops in each ear daily to soften earwax      . DISCONTD: isosorbide mononitrate (IMDUR) 60 MG 24 hr tablet TAKE 1 TABLET BY MOUTH EVERY DAY  30 tablet  3     No orders of the defined types were placed in this encounter.    Return in about 1 month (around 06/10/2012).

## 2012-05-10 NOTE — Patient Instructions (Addendum)
Change the Lantus to afternoon dosing every day .Marland Kitchen  Take your mixed insulin with breakfast and dinner meals 25 units unless you are skipping the meal. If you skip  The meal still give yourself 5 units   I am going to start mirtazipine daily at 9 PM.  We will start with 15 mg , and will increase in one month to 30 mg.    Increase your fluid pill by adding a second dose of furosemide at noon or 1:00 until you weight returns to 165 .

## 2012-05-11 ENCOUNTER — Encounter: Payer: Self-pay | Admitting: Internal Medicine

## 2012-05-11 DIAGNOSIS — F418 Other specified anxiety disorders: Secondary | ICD-10-CM | POA: Insufficient documentation

## 2012-05-11 NOTE — Assessment & Plan Note (Signed)
He has had several days of elevated blood pressures in the afternoon notably systolics in the 170s to 100 range. We will increase his morning dose of of beta blocker from half a tablet to one whole tablet.

## 2012-05-11 NOTE — Assessment & Plan Note (Signed)
He has discontinued all lorazepam and hyssop citalopram as well. He does seem less sedated today and less anxious.

## 2012-05-11 NOTE — Assessment & Plan Note (Signed)
Again we. The need to stop adjusting his medications on his own but to let me make the decisions. I've emphasized the need for consistency in in dosing before making adjustments. His dose will be 25 units twice daily of the mixed insulin. He was blood sugars to me in one week will make adjustments. The Lantus dose has not changed .

## 2012-05-11 NOTE — Assessment & Plan Note (Signed)
He has discontinued his citalopram and although I would like him to resume it, he has it in his mind that this was contributing to his depressed state. Therefore we will discontinue it permanently and begin mirtazapine 15 mg at bedtime.

## 2012-05-15 ENCOUNTER — Other Ambulatory Visit: Payer: Self-pay | Admitting: *Deleted

## 2012-05-15 MED ORDER — ALBUTEROL SULFATE HFA 108 (90 BASE) MCG/ACT IN AERS: 2.0000 | INHALATION_SPRAY | Freq: Four times a day (QID) | RESPIRATORY_TRACT | Status: DC | PRN

## 2012-06-10 ENCOUNTER — Other Ambulatory Visit: Payer: Self-pay | Admitting: Internal Medicine

## 2012-06-12 ENCOUNTER — Telehealth: Payer: Self-pay | Admitting: Internal Medicine

## 2012-06-12 MED ORDER — OXYCODONE HCL 60 MG PO TB12: 1.0000 | ORAL_TABLET | Freq: Two times a day (BID) | ORAL | Status: DC | PRN

## 2012-06-12 MED ORDER — OXYCODONE-ACETAMINOPHEN 5-325 MG PO TABS: 1.0000 | ORAL_TABLET | Freq: Four times a day (QID) | ORAL | Status: DC | PRN

## 2012-06-12 NOTE — Telephone Encounter (Signed)
Rxs printed and signed.  Patients daughter notified.

## 2012-06-12 NOTE — Telephone Encounter (Signed)
shawn called to get Patrick [name] rx of oxicotton and oxicodine refilled

## 2012-06-13 ENCOUNTER — Ambulatory Visit: Payer: Medicare Other | Admitting: Internal Medicine

## 2012-06-21 ENCOUNTER — Ambulatory Visit: Payer: Medicare Other | Admitting: Internal Medicine

## 2012-06-27 ENCOUNTER — Other Ambulatory Visit: Payer: Self-pay | Admitting: Internal Medicine

## 2012-06-27 MED ORDER — INSULIN GLARGINE 100 UNIT/ML ~~LOC~~ SOLN: 40.0000 [IU] | Freq: Every day | SUBCUTANEOUS | Status: DC

## 2012-06-29 ENCOUNTER — Other Ambulatory Visit: Payer: Self-pay | Admitting: *Deleted

## 2012-06-29 NOTE — Telephone Encounter (Signed)
Did not refill the NovoLog. I have asked him repeatedly to to start using 70/30. His dose should be 25 units twice daily of 70/30 NovoLog.

## 2012-06-29 NOTE — Telephone Encounter (Signed)
I called the pharmacy and notified them that he is not supposed to be on this medication.

## 2012-06-29 NOTE — Telephone Encounter (Signed)
Received faxed refill request for Novolog U-100 insulin.  This medication is not listed on med list, however Novolog 70/30 is on med list.  Please advise.

## 2012-07-04 ENCOUNTER — Other Ambulatory Visit: Payer: Self-pay | Admitting: Internal Medicine

## 2012-07-04 NOTE — Telephone Encounter (Signed)
Patient takes regular novalog not 70/30 he is out of his novalog please refill today

## 2012-07-10 ENCOUNTER — Other Ambulatory Visit: Payer: Self-pay | Admitting: Cardiovascular Disease

## 2012-07-10 ENCOUNTER — Other Ambulatory Visit: Payer: Self-pay | Admitting: Internal Medicine

## 2012-07-10 MED ORDER — FUROSEMIDE 20 MG PO TABS: 20.0000 mg | ORAL_TABLET | ORAL | Status: DC

## 2012-07-10 NOTE — Telephone Encounter (Signed)
Refilled Furosemide. 

## 2012-07-14 ENCOUNTER — Other Ambulatory Visit: Payer: Self-pay | Admitting: Internal Medicine

## 2012-07-14 NOTE — Telephone Encounter (Signed)
Patient needing a refill on OxyContin 60 mg and oxycodone 5-325 mg.

## 2012-07-17 ENCOUNTER — Telehealth: Payer: Self-pay | Admitting: Internal Medicine

## 2012-07-17 ENCOUNTER — Other Ambulatory Visit: Payer: Self-pay | Admitting: Internal Medicine

## 2012-07-17 MED ORDER — OXYCODONE-ACETAMINOPHEN 5-325 MG PO TABS: 1.0000 | ORAL_TABLET | Freq: Four times a day (QID) | ORAL | Status: DC | PRN

## 2012-07-17 MED ORDER — OXYCODONE HCL 60 MG PO TB12: 1.0000 | ORAL_TABLET | Freq: Two times a day (BID) | ORAL | Status: DC | PRN

## 2012-07-17 NOTE — Telephone Encounter (Signed)
Patrick [name] called checking on rx  For Patrick [name] stated she called last week for refill Please advise would like to pick up today @ 1 Oxycodone and oxicontin Pt needs to pick up today

## 2012-07-17 NOTE — Telephone Encounter (Signed)
Ok to refill 

## 2012-07-18 ENCOUNTER — Ambulatory Visit: Payer: Medicare Other | Admitting: Internal Medicine

## 2012-07-19 NOTE — Telephone Encounter (Signed)
Refills printed and handled

## 2012-07-20 ENCOUNTER — Ambulatory Visit: Payer: Medicare Other | Admitting: Internal Medicine

## 2012-07-21 ENCOUNTER — Emergency Department: Payer: Self-pay | Admitting: *Deleted

## 2012-07-21 LAB — BASIC METABOLIC PANEL
Calcium, Total: 8.1 mg/dL — ABNORMAL LOW (ref 8.5–10.1)
Chloride: 107 mmol/L (ref 98–107)
Co2: 26 mmol/L (ref 21–32)
EGFR (African American): 52 — ABNORMAL LOW
EGFR (Non-African Amer.): 45 — ABNORMAL LOW
Osmolality: 299 (ref 275–301)
Potassium: 4.4 mmol/L (ref 3.5–5.1)
Sodium: 143 mmol/L (ref 136–145)

## 2012-07-21 LAB — CBC
MCH: 28.4 pg (ref 26.0–34.0)
MCHC: 33.1 g/dL (ref 32.0–36.0)
Platelet: 250 10*3/uL (ref 150–440)
RBC: 3.83 10*6/uL — ABNORMAL LOW (ref 4.40–5.90)
WBC: 8.2 10*3/uL (ref 3.8–10.6)

## 2012-07-21 LAB — CK TOTAL AND CKMB (NOT AT ARMC): CK, Total: 156 U/L (ref 35–232)

## 2012-07-21 LAB — TROPONIN I: Troponin-I: <0.02 ng/mL

## 2012-07-21 LAB — PRO B NATRIURETIC PEPTIDE: B-Type Natriuretic Peptide: 4739 pg/mL — ABNORMAL HIGH (ref 0–125)

## 2012-07-24 ENCOUNTER — Ambulatory Visit (INDEPENDENT_AMBULATORY_CARE_PROVIDER_SITE_OTHER): Payer: Medicare Other | Admitting: Internal Medicine

## 2012-07-24 ENCOUNTER — Encounter: Payer: Self-pay | Admitting: Internal Medicine

## 2012-07-24 VITALS — BP 142/64 | HR 72 | Temp 98.3°F | Ht 69.0 in | Wt 174.2 lb

## 2012-07-24 DIAGNOSIS — Z23 Encounter for immunization: Secondary | ICD-10-CM

## 2012-07-24 DIAGNOSIS — E1165 Type 2 diabetes mellitus with hyperglycemia: Secondary | ICD-10-CM

## 2012-07-24 DIAGNOSIS — I2589 Other forms of chronic ischemic heart disease: Secondary | ICD-10-CM

## 2012-07-24 DIAGNOSIS — I255 Ischemic cardiomyopathy: Secondary | ICD-10-CM

## 2012-07-24 LAB — COMPREHENSIVE METABOLIC PANEL
BUN: 27 mg/dL — ABNORMAL HIGH (ref 6–23)
CO2: 32 mEq/L (ref 19–32)
Calcium: 8.9 mg/dL (ref 8.4–10.5)
Chloride: 104 mEq/L (ref 96–112)
Creatinine, Ser: 1.3 mg/dL (ref 0.4–1.5)
GFR: 57.1 mL/min — ABNORMAL LOW (ref >60.00)

## 2012-07-24 MED ORDER — INSULIN ASPART PROT & ASPART (70-30 MIX) 100 UNIT/ML ~~LOC~~ SUSP: 25.0000 [IU] | Freq: Two times a day (BID) | SUBCUTANEOUS | Status: DC

## 2012-07-24 MED ORDER — INSULIN GLARGINE 100 UNIT/ML ~~LOC~~ SOLN: 40.0000 [IU] | Freq: Every day | SUBCUTANEOUS | Status: DC

## 2012-07-24 NOTE — Assessment & Plan Note (Signed)
70//30 insulin twice daily and Lantus.

## 2012-07-24 NOTE — Assessment & Plan Note (Signed)
With recent chf exacerbation secondary to dietary intolerance. Continue high dose lasix until he is back to baseline weight. Renal function ordered.   

## 2012-07-24 NOTE — Patient Instructions (Addendum)
You should limit your sodium to 2 grams  (2000 mg ) daily .  Look for it in bread, cookies,  Lunch meat,  And snacks.   Continue the 50 mg of lasix until your weight is back down to your baseline.   Use the 70/30 insulin (Novolog) before breakfast and before evening meal.

## 2012-07-24 NOTE — Progress Notes (Signed)
Patient ID: Patrick [name], male   DOB: 1940-10-21, 71 y.o.   MRN: [id number]  Patient Active Problem List  Diagnosis  . HYPERLIPIDEMIA-MIXED  . TOBACCO ABUSE  . HYPERTENSION, BENIGN  . CAD, AUTOLOGOUS BYPASS GRAFT  . EDEMA  . Diabetes mellitus type 2 with complications, uncontrolled  . Anxiety  . Peripheral vascular disease  . Anemia  . Cardiomyopathy, ischemic  . Tobacco abuse counseling  . Hyperkalemia  . Chronic kidney disease (CKD), stage III (moderate)  . Depression with anxiety    Subjective:  CC:   Chief Complaint  Patient presents with  . Leg Swelling    HPI:   Patrick [name] a 71 y.o. male who presents  after ER follow up  For fluid retnetion.  Ruled out for acute MI>  CXR confirmed puolmonary edema.  Was given IV lasxia dn doe increased to 40 mg bid for 3 days.  Has lost 7 of 1 lb wt gain thus far.  Dietary indiscretions noted as cause. 2) DM: blood sugars have been 140 to 170.  Feels bad when they are 140l  Not using the 70/30 insulin yet.     Past Medical History  Diagnosis Date  . Hypertension   . Hyperlipidemia   . Coronary artery disease     lbbb  . PAD (peripheral artery disease)   . Diabetes mellitus     poorly controlled  . Anxiety   . Tobacco abuse   . Peripheral vascular disease     with prior stents placed bilaterally  . MI (myocardial infarction) 2000  . Anemia   . COPD exacerbation   . Acute renal failure     due to ATN versus hypovolemia  . Acute exacerbation of CHF (congestive heart failure)     EF 25%,    Past Surgical History  Procedure Date  . Coronary artery bypass graft     x4  . Stents in legs 03/2010    [facility]  . Cholecystectomy   . Cardiac catheterization 2010    [place] [facility]  . Cataract extraction   . Cataract extraction, bilateral 2013         The following portions of the patient's history were reviewed and updated as appropriate: Allergies, current medications, and problem list.    Review of  Systems:   12 Pt  review of systems was negative except those addressed in the HPI,     History   Social History  . Marital Status: Single    Spouse Name: N/A    Number of Children: N/A  . Years of Education: N/A   Occupational History  . Not on file.   Social History Main Topics  . Smoking status: Current Every Day Smoker -- 0.5 packs/day for 30 years    Types: Cigarettes  . Smokeless tobacco: Never Used  . Alcohol Use: No  . Drug Use: No  . Sexually Active: Not on file   Other Topics Concern  . Not on file   Social History Narrative  . No narrative on file    Objective:  BP 142/64  Pulse 72  Temp 98.3 F (36.8 C) (Oral)  Ht 5\' 9"  (1.753 m)  Wt 174 lb 4 oz (79.039 kg)  BMI 25.73 kg/m2  SpO2 97%  General appearance: alert, cooperative and appears stated age Ears: normal TM's and external ear canals both ears Throat: lips, mucosa, and tongue normal; teeth and gums normal Neck: no adenopathy, no carotid bruit, supple, symmetrical,  trachea midline and thyroid not enlarged, symmetric, no tenderness/mass/nodules Back: symmetric, no curvature. ROM normal. No CVA tenderness. Lungs: clear to auscultation bilaterally Heart: regular rate and rhythm, S1, S2 normal, no murmur, click, rub or gallop Abdomen: soft, non-tender; bowel sounds normal; no masses,  no organomegaly Pulses: 2+ and symmetric Skin: Skin color, texture, turgor normal. No rashes or lesions Lymph nodes: Cervical, supraclavicular, and axillary nodes normal.  Assessment and Plan:  Cardiomyopathy, ischemic With recent chf exacerbation secondary to dietary intolerance.  Continue high dose lasix until he is back to baseline weight.  Renal function ordered.   Diabetes mellitus type 2 with complications, uncontrolled 70//30 insulin twice daily and Lantus.    Updated Medication List Outpatient Encounter Prescriptions as of 07/24/2012  Medication Sig Dispense Refill  . albuterol (PROAIR HFA) 108 (90  BASE) MCG/ACT inhaler Inhale 2 puffs into the lungs every 6 (six) hours as needed for wheezing or shortness of breath.  18 g  3  . aspirin 81 MG tablet Take 81 mg by mouth daily.      . carvedilol (COREG) 12.5 MG tablet Take 12.5 mg by mouth 2 (two) times daily with a meal.      . carvedilol (COREG) 12.5 MG tablet TAKE 1 TABLET BY MOUTH TWICE DAILY  60 tablet  3  . citalopram (CELEXA) 20 MG tablet TAKE 1 TABLET BY MOUTH EVERY DAY  30 tablet  5  . dimenhyDRINATE (DRAMAMINE) 50 MG tablet Take 50 mg by mouth every 8 (eight) hours as needed.        . ferrous sulfate 325 (65 FE) MG EC tablet Take 325 mg by mouth daily with breakfast.        . furosemide (LASIX) 20 MG tablet Take 50 mg by mouth daily. Take 50 mg daily for 4 days then back to 20 mg daily therafter      . gabapentin (NEURONTIN) 300 MG capsule Take 1 capsule (300 mg total) by mouth daily.  30 capsule  3  . insulin glargine (LANTUS) 100 UNIT/ML injection Inject 40 Units into the skin at bedtime.  10 mL  6  . Insulin Syringe-Needle U-100 (INSULIN SYRINGE 1CC/30GX5/16") 30G X 5/16" 1 ML MISC USE FOUR TIMES DAILY  200 each  7  . ipratropium-albuterol (DUONEB) 0.5-2.5 (3) MG/3ML SOLN Take 3 mLs by nebulization every 4 (four) hours as needed.      . isosorbide mononitrate (IMDUR) 30 MG 24 hr tablet Take 1 tablet (30 mg total) by mouth daily.  30 tablet  3  . LORazepam (ATIVAN) 0.5 MG tablet Take 0.5 mg by mouth as needed.      Marland Kitchen NEXIUM 40 MG capsule TAKE 1 CAPSULE BY MOUTH EVERY DAY  30 capsule  5  . nitroGLYCERIN (NITROSTAT) 0.4 MG SL tablet Place 1 tablet (0.4 mg total) under the tongue every 5 (five) minutes as needed.  30 tablet  6  . Oxycodone HCl (OXYCONTIN) 60 MG TB12 Take 1 tablet (60 mg total) by mouth every 12 (twelve) hours as needed.  60 each  0  . oxyCODONE-acetaminophen (PERCOCET/ROXICET) 5-325 MG per tablet Take 1 tablet by mouth every 6 (six) hours as needed.  120 tablet  0  . simvastatin (ZOCOR) 40 MG tablet TAKE 1 TABLET BY  MOUTH EVERY NIGHT AT BEDTIME FOR CHOLESTEROL  30 tablet  6  . tiotropium (SPIRIVA) 18 MCG inhalation capsule Place 18 mcg into inhaler and inhale daily.      Marland Kitchen DISCONTD: furosemide (LASIX) 20  MG tablet Take 1 tablet (20 mg total) by mouth every other day.  30 tablet  2  . DISCONTD: insulin aspart protamine-insulin aspart (NOVOLOG 70/30) (70-30) 100 UNIT/ML injection Inject 20 Units into the skin as directed.       Marland Kitchen DISCONTD: insulin glargine (LANTUS) 100 UNIT/ML injection Inject 40 Units into the skin at bedtime.  10 mL  6  . furosemide (LASIX) 20 MG tablet TAKE 1 TABLET BY MOUTH TWICE DAILY  60 tablet  3  . insulin aspart protamine-insulin aspart (NOVOLOG 70/30) (70-30) 100 UNIT/ML injection Inject 25 Units into the skin 2 (two) times daily with a meal.  10 mL  12  . mirtazapine (REMERON SOL-TAB) 15 MG disintegrating tablet Take 1 tablet (15 mg total) by mouth at bedtime.  30 tablet  3     Orders Placed This Encounter  Procedures  . Flu vaccine greater than or equal to 3yo preservative free IM  . Hemoglobin A1c  . Comprehensive metabolic panel  . LDL cholesterol, direct    No Follow-up on file.

## 2012-07-25 ENCOUNTER — Ambulatory Visit: Payer: Medicare Other | Admitting: Internal Medicine

## 2012-08-07 ENCOUNTER — Encounter: Payer: Self-pay | Admitting: Cardiovascular Disease

## 2012-08-07 ENCOUNTER — Ambulatory Visit (INDEPENDENT_AMBULATORY_CARE_PROVIDER_SITE_OTHER): Payer: Medicare Other | Admitting: Cardiovascular Disease

## 2012-08-07 VITALS — BP 112/48 | HR 59 | Ht 70.0 in | Wt 176.5 lb

## 2012-08-07 DIAGNOSIS — E118 Type 2 diabetes mellitus with unspecified complications: Secondary | ICD-10-CM

## 2012-08-07 DIAGNOSIS — I10 Essential (primary) hypertension: Secondary | ICD-10-CM

## 2012-08-07 DIAGNOSIS — I255 Ischemic cardiomyopathy: Secondary | ICD-10-CM

## 2012-08-07 DIAGNOSIS — I2589 Other forms of chronic ischemic heart disease: Secondary | ICD-10-CM

## 2012-08-07 DIAGNOSIS — R0989 Other specified symptoms and signs involving the circulatory and respiratory systems: Secondary | ICD-10-CM

## 2012-08-07 DIAGNOSIS — I25810 Atherosclerosis of coronary artery bypass graft(s) without angina pectoris: Secondary | ICD-10-CM

## 2012-08-07 DIAGNOSIS — E785 Hyperlipidemia, unspecified: Secondary | ICD-10-CM

## 2012-08-07 NOTE — Assessment & Plan Note (Signed)
Currently with no symptoms of angina. No further workup at this time. Continue current medication regimen. 

## 2012-08-07 NOTE — Patient Instructions (Addendum)
You are doing well. No medication changes were made.  Goal weight 166 to 168 pounds Take extra lasix if weight goes above goal weight   We will schedule you for a carotid ultrasound  Please call us if you have new issues that need to be addressed before your next appt.  Your physician wants you to follow-up in: 6 months.  You will receive a reminder letter in the mail two months in advance. If you don't receive a letter, please call our office to schedule the follow-up appointment.

## 2012-08-07 NOTE — Assessment & Plan Note (Signed)
Cholesterol is at goal on the current lipid regimen. No changes to the medications were made.  

## 2012-08-07 NOTE — Progress Notes (Signed)
Patient ID: Patrick [name], male    DOB: 04-Jan-1941, 71 y.o.   MRN: [id number]  HPI Comments: Mr. 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 He was admitted with systolic CHF. He also had renal dysfunction likely secondary to long-standing poorly controlled diabetes. Hemoglobin A1c was 10, now down to 9.  Echocardiogram at that time showed severely depressed and weak function of less than 25%, diastolic dysfunction, moderately elevated right ventricular systolic pressures  During his hospital course, heart failure regimen was started with low-dose ACE, beta blocker, diuretic with improvement of his symptoms.  He reports that he has not been taking his Lasix on a regular basis. Weight at home is 168 pounds.  Overall he reports that he is feeling well. He denies any significant shortness of breath.  He did go to the emergency room 1 month ago with edema and shortness of breath. He was told to increase his diuretic. Symptoms did improve. Again he is not taking his Lasix regularly.  ACE inhibitor has been held as of discharge secondary to renal insufficiency.  EKG today shows normal sinus rhythm with rate 59 beats per minute with interventricular conduction delay, nonspecific ST and T wave abnormality in lead V5, V6  Outpatient Encounter Prescriptions as of 08/07/2012  Medication Sig Dispense Refill  . albuterol (PROAIR HFA) 108 (90 BASE) MCG/ACT inhaler Inhale 2 puffs into the lungs every 6 (six) hours as needed for wheezing or shortness of breath.  18 g  3  . aspirin 81 MG tablet Take 81 mg by mouth daily.      .  carvedilol (COREG) 12.5 MG tablet Take 12.5 mg by mouth 2 (two) times daily with a meal.      . dimenhyDRINATE (DRAMAMINE) 50 MG tablet Take 50 mg by mouth every 8 (eight) hours as needed.        . ferrous sulfate 325 (65 FE) MG EC tablet Take 325 mg by mouth daily with breakfast.        . furosemide (LASIX) 20 MG tablet TAKE 1 TABLET BY MOUTH TWICE DAILY  60 tablet  3  . gabapentin (NEURONTIN) 300 MG capsule Take 1 capsule (300 mg total) by mouth daily.  30 capsule  3  . insulin aspart protamine-insulin aspart (NOVOLOG 70/30) (70-30) 100 UNIT/ML injection Inject 25 Units into the skin 2 (two) times daily with a meal.  10 mL  12  . insulin glargine (LANTUS) 100 UNIT/ML injection Inject 40 Units into the skin at bedtime.  10 mL  6  . Insulin Syringe-Needle U-100 (INSULIN SYRINGE 1CC/30GX5/16") 30G X 5/16" 1 ML MISC USE FOUR TIMES DAILY  200 each  7  . ipratropium-albuterol (DUONEB) 0.5-2.5 (3) MG/3ML SOLN Take 3 mLs by nebulization every 4 (four) hours as needed.      . isosorbide mononitrate (IMDUR) 30 MG 24 hr tablet Take 1 tablet (30 mg total) by mouth daily.  30 tablet  3  . NEXIUM 40 MG capsule TAKE 1 CAPSULE BY MOUTH EVERY DAY  30 capsule  5  . nitroGLYCERIN (NITROSTAT) 0.4 MG SL tablet Place 1 tablet (  0.4 mg total) under the tongue every 5 (five) minutes as needed.  30 tablet  6  . Oxycodone HCl (OXYCONTIN) 60 MG TB12 Take 1 tablet (60 mg total) by mouth every 12 (twelve) hours as needed.  60 each  0  . oxyCODONE-acetaminophen (PERCOCET/ROXICET) 5-325 MG per tablet Take 1 tablet by mouth every 6 (six) hours as needed.  120 tablet  0  . simvastatin (ZOCOR) 40 MG tablet TAKE 1 TABLET BY MOUTH EVERY NIGHT AT BEDTIME FOR CHOLESTEROL  30 tablet  6  . tiotropium (SPIRIVA) 18 MCG inhalation capsule Place 18 mcg into inhaler and inhale daily.      . mirtazapine (REMERON SOL-TAB) 15 MG disintegrating tablet Take 1 tablet (15 mg total) by mouth at bedtime.  30 tablet  3    Review of Systems    Constitutional: Negative.   HENT: Negative.   Eyes: Positive for visual disturbance.  Gastrointestinal: Negative.   Musculoskeletal: Negative.   Skin: Negative.   Neurological: Negative.   Hematological: Negative.   Psychiatric/Behavioral: Negative.   All other systems reviewed and are negative.   BP 112/48  Pulse 59  Ht 5\' 10"  (1.778 m)  Wt 176 lb 8 oz (80.06 kg)  BMI 25.33 kg/m2  Physical Exam  Nursing note and vitals reviewed. Constitutional: He is oriented to person, place, and time. He appears well-developed and well-nourished.  HENT:  Head: Normocephalic.  Nose: Nose normal.  Mouth/Throat: Oropharynx is clear and moist.  Eyes: Conjunctivae normal are normal. Pupils are equal, round, and reactive to light.  Neck: Normal range of motion. Neck supple. No JVD present. Carotid bruit is present.  Cardiovascular: Normal rate, regular rhythm, S1 normal, S2 normal and intact distal pulses.  Exam reveals no gallop and no friction rub.   Murmur heard.  Crescendo systolic murmur is present with a grade of 2/6       Trace ankle edema.  Pulmonary/Chest: Effort normal. No respiratory distress. He has decreased breath sounds. He has no wheezes. He has no rales. He exhibits no tenderness.  Abdominal: Soft. Bowel sounds are normal. He exhibits no distension. There is no tenderness.  Musculoskeletal: Normal range of motion. He exhibits no edema and no tenderness.  Lymphadenopathy:    He has no cervical adenopathy.  Neurological: He is alert and oriented to person, place, and time. Coordination normal.  Skin: Skin is warm and dry. No rash noted. No erythema.  Psychiatric: He has a normal mood and affect. His behavior is normal. Judgment and thought content normal.           Assessment and Plan

## 2012-08-07 NOTE — Assessment & Plan Note (Signed)
We have encouraged him to stay on Lasix daily. Goal weight 168 pounds or less when at home. Weight here today is 176 pounds, close to weight from previous visit. He does have trace edema and could probably lose 2-3 pounds.

## 2012-08-07 NOTE — Assessment & Plan Note (Signed)
We have encouraged continued exercise, careful diet management in an effort to lose weight. 

## 2012-08-07 NOTE — Assessment & Plan Note (Signed)
Blood pressure is well controlled on today's visit. No changes made to the medications. 

## 2012-08-08 ENCOUNTER — Other Ambulatory Visit: Payer: Self-pay | Admitting: Internal Medicine

## 2012-08-10 ENCOUNTER — Other Ambulatory Visit: Payer: Self-pay | Admitting: Internal Medicine

## 2012-08-10 NOTE — Telephone Encounter (Signed)
See message attached

## 2012-08-10 NOTE — Telephone Encounter (Signed)
Patrick [name] called to get refill on oxcodone and oxycontin Please call when ready to pick up

## 2012-08-11 ENCOUNTER — Other Ambulatory Visit: Payer: Self-pay

## 2012-08-11 MED ORDER — OXYCODONE HCL ER 60 MG PO T12A: 1.0000 | EXTENDED_RELEASE_TABLET | Freq: Two times a day (BID) | ORAL | Status: DC | PRN

## 2012-08-11 MED ORDER — OXYCODONE-ACETAMINOPHEN 5-325 MG PO TABS: 1.0000 | ORAL_TABLET | Freq: Four times a day (QID) | ORAL | Status: DC | PRN

## 2012-08-11 NOTE — Telephone Encounter (Signed)
Rx faxed to [facility] [phone]

## 2012-08-11 NOTE — Telephone Encounter (Signed)
Ok to refill,  Authorized in Counselling psychologist

## 2012-08-11 NOTE — Telephone Encounter (Signed)
Left message with patient daughter that Rx was here at the office ready for pickup.

## 2012-08-19 ENCOUNTER — Other Ambulatory Visit: Payer: Self-pay | Admitting: Internal Medicine

## 2012-08-21 NOTE — Telephone Encounter (Signed)
Reordered patients Spiriva Handihaler mcg per epic

## 2012-08-23 ENCOUNTER — Other Ambulatory Visit: Payer: Self-pay | Admitting: Internal Medicine

## 2012-08-24 ENCOUNTER — Encounter (INDEPENDENT_AMBULATORY_CARE_PROVIDER_SITE_OTHER): Payer: Medicare Other

## 2012-08-24 DIAGNOSIS — I6529 Occlusion and stenosis of unspecified carotid artery: Secondary | ICD-10-CM

## 2012-08-24 DIAGNOSIS — R0989 Other specified symptoms and signs involving the circulatory and respiratory systems: Secondary | ICD-10-CM

## 2012-08-24 NOTE — Telephone Encounter (Signed)
Refilled PROAIR HFA per epic

## 2012-08-28 ENCOUNTER — Other Ambulatory Visit: Payer: Self-pay | Admitting: Internal Medicine

## 2012-09-07 ENCOUNTER — Telehealth: Payer: Self-pay

## 2012-09-07 ENCOUNTER — Telehealth: Payer: Self-pay | Admitting: Internal Medicine

## 2012-09-07 MED ORDER — METHOCARBAMOL 500 MG PO TABS: 500.0000 mg | ORAL_TABLET | Freq: Four times a day (QID) | ORAL | Status: DC

## 2012-09-07 MED ORDER — MELOXICAM 15 MG PO TABS: 15.0000 mg | ORAL_TABLET | Freq: Every day | ORAL | Status: DC

## 2012-09-07 NOTE — Telephone Encounter (Signed)
rx for robaxin sent.  Warn patient that this can  Cause drowsiness.Patrick [name]  He should try the meloxciam first which I also sent

## 2012-09-07 NOTE — Telephone Encounter (Signed)
Patient daughter called requesting a muscle relaxer for her father sent to [facility].

## 2012-09-07 NOTE — Telephone Encounter (Signed)
Patient Information:  Caller Name: Ines Bloomer  Phone: [phone]  Patient: Patrick [name], Patrick [name]  Gender: Male  DOB: August 07, 1941  Age: 70 Years  PCP: Duncan Dull (Adults only)   Symptoms  Reason For Call & Symptoms: He has pulled back in trying to clean out a building  Reviewed Health History In EMR: Yes  Reviewed Medications In EMR: Yes  Reviewed Allergies In EMR: N/A  Reviewed Surgeries / Procedures: Yes  Date of Onset of Symptoms: 09/04/2012  Treatments Tried: Is on pain medication, but its not helping.  Is getting worse.  Treatments Tried Worked: Yes  Guideline(s) Used:  Back Pain  Disposition Per Guideline:   See Within 2 Weeks in Office  Reason For Disposition Reached:   Back pain is a chronic symptom (recurrent or ongoing AND lasting > 4 weeks)  Advice Given:  Cold or Heat:  Heat Pack: If pain lasts over 2 days, apply heat to the sore area. Use a heat pack, heating pad, or warm wet washcloth. Do this for 10 minutes, then as needed. For widespread stiffness, take a hot bath or hot shower instead. Move the sore area under the warm water.  Sleep:  Sleep on your side with a pillow between your knees. If you sleep on your back, put a pillow under your knees.  Avoid sleeping on your stomach.  Activity  Keep doing your day-to-day activities if it is not too painful. Staying active is better than resting.  Avoid anything that makes your pain worse. Avoid heavy lifting, twisting, and too much exercise until your back heals.  You do not need to stay in bed.  Call Back If:  Numbness or weakness occur  Bowel/bladder problems occur  Pain lasts for more than 2 weeks  You become worse.  Office Follow Up:  Does the office need to follow up with this patient?: Yes  Instructions For The Office: Daughter is requesting a muscle relaxor for her dad be called into  [facility] [address] at [address] .  RN Overrode Recommendation:  Patient Requests Prescription  Patient has  chronic pain, but this is a flare up from cleaning out a building.  Is requesting a muscle relaxer since pain medication is not working.  RN Note:  Rates back pain as 8/10 on pain scale; is having difficulty getting up off of commode.  He is able to take care of safe, but has pain on movement. Onset of pain related to cleaning out a building first of the week--has cause a flare up to what is usually a chronic conditions.  Daughter calls today to request a muscle relaxer.  Regular medications are not working at this time.

## 2012-09-08 ENCOUNTER — Other Ambulatory Visit: Payer: Self-pay

## 2012-09-08 ENCOUNTER — Other Ambulatory Visit: Payer: Self-pay | Admitting: Internal Medicine

## 2012-09-08 NOTE — Telephone Encounter (Signed)
Left message on patient vm with detailed message about Robaxin and Meloxicam called in to [facility].

## 2012-09-11 ENCOUNTER — Other Ambulatory Visit: Payer: Self-pay | Admitting: Internal Medicine

## 2012-09-11 ENCOUNTER — Telehealth: Payer: Self-pay | Admitting: Internal Medicine

## 2012-09-11 ENCOUNTER — Other Ambulatory Visit: Payer: Self-pay

## 2012-09-11 MED ORDER — OXYCODONE-ACETAMINOPHEN 5-325 MG PO TABS: 1.0000 | ORAL_TABLET | Freq: Four times a day (QID) | ORAL | Status: DC | PRN

## 2012-09-11 MED ORDER — OXYCODONE HCL ER 60 MG PO T12A: 1.0000 | EXTENDED_RELEASE_TABLET | Freq: Two times a day (BID) | ORAL | Status: DC | PRN

## 2012-09-11 NOTE — Progress Notes (Signed)
rx faxed to [facility] pharmacy

## 2012-09-11 NOTE — Telephone Encounter (Signed)
Pt is needing refill on Oxycodone and Oxycotton ?? jrt

## 2012-09-16 NOTE — Telephone Encounter (Signed)
Refills authorized and printed remotely

## 2012-09-16 NOTE — Telephone Encounter (Signed)
Disregard previous message re HIDA scan

## 2012-09-16 NOTE — Telephone Encounter (Signed)
HIS HIDA scan was normal so his postprandial pain dose not appear to be due to is gallbladder.  I recommend a trial of 4 weeks of omeprazole 40 mg in the morning at least 30 minutes prior to food or coffee  Intake and avoidance or red meat it aggravates dymptoms.,  If not improved in 4 weeks will refer to GI

## 2012-09-26 ENCOUNTER — Other Ambulatory Visit: Payer: Self-pay | Admitting: Internal Medicine

## 2012-09-26 ENCOUNTER — Emergency Department: Payer: Self-pay | Admitting: Emergency Medicine

## 2012-09-28 ENCOUNTER — Telehealth: Payer: Self-pay | Admitting: Internal Medicine

## 2012-09-28 ENCOUNTER — Telehealth: Payer: Self-pay | Admitting: General Practice

## 2012-09-28 ENCOUNTER — Emergency Department: Payer: Self-pay | Admitting: Emergency Medicine

## 2012-09-28 NOTE — Telephone Encounter (Signed)
Can you make this pt an appt for tomorrow per Dr. Dan Humphreys.

## 2012-09-28 NOTE — Telephone Encounter (Signed)
Pt will need to be seen. Could use an urgent slot here tomorrow.

## 2012-09-28 NOTE — Telephone Encounter (Signed)
Pt daughter called to let Dr. Darrick Huntsman know that pt was having severe back pain that was radiating down pt leg. Wanted to know if he could get a cortisone shot here or if a referral or appointment elsewhere was needed. Please advise.

## 2012-09-28 NOTE — Telephone Encounter (Signed)
Patient Information:  Caller Name: Patrick [name]  Phone: [phone]  Patient: Patrick [name], Patrick [name]  Gender: Male  DOB: 05-24-1941  Age: 71 Years  PCP: Duncan Dull (Adults only)  Office Follow Up:  Does the office need to follow up with this patient?: No  Instructions For The Office: N/A  RN Note:  In bed due to pain; can hardly walk.  Pain rated 10/10.  Did not want to talk with nurse due to severe pain. FBS 240 at 0800. Took morning insulin after FBS tested.  Fell sideways on golf cart approximately 09/21/12. Negative Xrays at ED 09/26/12. Pain not relieved with Ocycodone or Oxycotin. Explained why severe pain with numbness in groin needs to be re-evaluated at ED; that office visit is inappropriate for his acuity.  Follow up with office after ED visit to scheduled appointment based on ED instructions.  Symptoms  Reason For Call & Symptoms: Severe lower back pain with sciatica in R leg; ASking for appointment for Cortisone injection.  Reviewed Health History In EMR: Yes  Reviewed Medications In EMR: Yes  Reviewed Allergies In EMR: Yes  Reviewed Surgeries / Procedures: Yes  Date of Onset of Symptoms: 09/21/2012  Treatments Tried: ED evaluation, Ibuprofen, RX narcotic, heating pad  Treatments Tried Worked: No  Guideline(s) Used:  Back Pain  Disposition Per Guideline:   Go to ED Now  Reason For Disposition Reached:   Numbness (loss of sensation) in groin or rectal area  Advice Given:  Cold or Heat:  Cold Pack: For pain or swelling, use a cold pack or ice wrapped in a wet cloth. Put it on the sore area for 20 minutes. Repeat 4 times on the first day, then as needed.  Heat Pack: If pain lasts over 2 days, apply heat to the sore area. Use a heat pack, heating pad, or warm wet washcloth. Do this for 10 minutes, then as needed. For widespread stiffness, take a hot bath or hot shower instead. Move the sore area under the warm water.

## 2012-09-28 NOTE — Telephone Encounter (Signed)
Tried calling pt to schedule he did not have a voicemail for home number but I left message on cell number to call and schedule appt.

## 2012-09-28 NOTE — Telephone Encounter (Signed)
FYI patient advised to go to ED by call nurse.

## 2012-09-29 ENCOUNTER — Ambulatory Visit: Payer: Medicare Other | Admitting: Internal Medicine

## 2012-10-03 ENCOUNTER — Telehealth: Payer: Self-pay | Admitting: Cardiovascular Disease

## 2012-10-03 NOTE — Telephone Encounter (Signed)
Pt's dtr called to say pt has not been compliant with his lasix as he should be. Says he is having some LE edema and sob. She recalls pt being able to take "extra" in the past but cannot remember "how to do this".  She is unsure of pt weight since he has not been able to weigh due to being "down on his back" lately I advised to have him take lasix 20 mg BID x 2 days then call to report. Understanding verb

## 2012-10-06 ENCOUNTER — Ambulatory Visit: Payer: Self-pay | Admitting: Internal Medicine

## 2012-10-06 ENCOUNTER — Ambulatory Visit (INDEPENDENT_AMBULATORY_CARE_PROVIDER_SITE_OTHER): Payer: Medicare Other | Admitting: Internal Medicine

## 2012-10-06 ENCOUNTER — Encounter: Payer: Self-pay | Admitting: Internal Medicine

## 2012-10-06 VITALS — BP 150/68 | HR 75 | Temp 98.2°F | Resp 16 | Wt 168.0 lb

## 2012-10-06 DIAGNOSIS — J449 Chronic obstructive pulmonary disease, unspecified: Secondary | ICD-10-CM

## 2012-10-06 DIAGNOSIS — F411 Generalized anxiety disorder: Secondary | ICD-10-CM

## 2012-10-06 DIAGNOSIS — R05 Cough: Secondary | ICD-10-CM

## 2012-10-06 DIAGNOSIS — F419 Anxiety disorder, unspecified: Secondary | ICD-10-CM

## 2012-10-06 DIAGNOSIS — I739 Peripheral vascular disease, unspecified: Secondary | ICD-10-CM

## 2012-10-06 DIAGNOSIS — I2589 Other forms of chronic ischemic heart disease: Secondary | ICD-10-CM

## 2012-10-06 DIAGNOSIS — Z716 Tobacco abuse counseling: Secondary | ICD-10-CM

## 2012-10-06 DIAGNOSIS — I255 Ischemic cardiomyopathy: Secondary | ICD-10-CM

## 2012-10-06 DIAGNOSIS — F418 Other specified anxiety disorders: Secondary | ICD-10-CM

## 2012-10-06 DIAGNOSIS — Z7189 Other specified counseling: Secondary | ICD-10-CM

## 2012-10-06 DIAGNOSIS — F341 Dysthymic disorder: Secondary | ICD-10-CM

## 2012-10-06 DIAGNOSIS — F17200 Nicotine dependence, unspecified, uncomplicated: Secondary | ICD-10-CM

## 2012-10-06 DIAGNOSIS — R0989 Other specified symptoms and signs involving the circulatory and respiratory systems: Secondary | ICD-10-CM

## 2012-10-06 LAB — CBC WITH DIFFERENTIAL/PLATELET
Basophil #: 0.1 10*3/uL (ref 0.0–0.1)
Basophil %: 0.8 %
Eosinophil #: 0.2 10*3/uL (ref 0.0–0.7)
Eosinophil %: 2.4 %
HCT: 35.3 % — ABNORMAL LOW (ref 40.0–52.0)
Lymphocyte #: 1.4 10*3/uL (ref 1.0–3.6)
Lymphocyte %: 18.7 %
MCHC: 31.6 g/dL — ABNORMAL LOW (ref 32.0–36.0)
MCV: 85 fL (ref 80–100)
Monocyte #: 0.5 x10 3/mm (ref 0.2–1.0)
Neutrophil %: 71.4 %
Platelet: 214 10*3/uL (ref 150–440)
RBC: 4.14 10*6/uL — ABNORMAL LOW (ref 4.40–5.90)
RDW: 16.1 % — ABNORMAL HIGH (ref 11.5–14.5)
WBC: 7.6 10*3/uL (ref 3.8–10.6)

## 2012-10-06 LAB — COMPREHENSIVE METABOLIC PANEL
Albumin: 2.8 g/dL — ABNORMAL LOW (ref 3.4–5.0)
Alkaline Phosphatase: 105 U/L (ref 50–136)
BUN: 31 mg/dL — ABNORMAL HIGH (ref 7–18)
Bilirubin,Total: 0.5 mg/dL (ref 0.2–1.0)
Calcium, Total: 8.4 mg/dL — ABNORMAL LOW (ref 8.5–10.1)
Chloride: 103 mmol/L (ref 98–107)
Co2: 33 mmol/L — ABNORMAL HIGH (ref 21–32)
EGFR (African American): >60
Glucose: 231 mg/dL — ABNORMAL HIGH (ref 65–99)
SGOT(AST): 11 U/L — ABNORMAL LOW (ref 15–37)
SGPT (ALT): 12 U/L (ref 12–78)
Sodium: 140 mmol/L (ref 136–145)
Total Protein: 6.8 g/dL (ref 6.4–8.2)

## 2012-10-06 MED ORDER — OXYCODONE HCL ER 60 MG PO T12A: 1.0000 | EXTENDED_RELEASE_TABLET | Freq: Two times a day (BID) | ORAL | Status: DC | PRN

## 2012-10-06 MED ORDER — LEVOFLOXACIN 500 MG PO TABS: 500.0000 mg | ORAL_TABLET | Freq: Every day | ORAL | Status: DC

## 2012-10-06 MED ORDER — PREDNISONE (PAK) 10 MG PO TABS: ORAL_TABLET | ORAL | Status: DC

## 2012-10-06 MED ORDER — OXYCODONE-ACETAMINOPHEN 5-325 MG PO TABS: 1.0000 | ORAL_TABLET | Freq: Four times a day (QID) | ORAL | Status: DC | PRN

## 2012-10-06 NOTE — Progress Notes (Signed)
Patient ID: Patrick [name], male   DOB: 1941-03-14, 72 y.o.   MRN: [id number]   Patient Active Problem List  Diagnosis  . HYPERLIPIDEMIA-MIXED  . TOBACCO ABUSE  . HYPERTENSION, BENIGN  . CAD, AUTOLOGOUS BYPASS GRAFT  . EDEMA  . Diabetes mellitus type 2 with complications, uncontrolled  . Anxiety  . Peripheral vascular disease  . Anemia  . Cardiomyopathy, ischemic  . Tobacco abuse counseling  . Hyperkalemia  . Chronic kidney disease (CKD), stage III (moderate)  . Depression with anxiety  . Bronchitis with chronic airway obstruction    Subjective:  CC:   Chief Complaint  Patient presents with  . Follow-up    FROM FALL HURTING BACK  . Ear Fullness  . Nasal Congestion  . Chest Pain  . CONGESTION    HPI:   Patrick [name] a 72 y.o. male who presents for worsening cough , congestion and  depression. He continues to tolerate Remeron in the evening but continues to defer retrial of a SSRI since he did not feel that the Celexa helped him at last visit. He feels generally bad due to physical symptoms of sinus congestion and fluid retention and ear pain. He had a fall 3 weeks ago on the 19th and was treated in ER twice , eventually for signs and symptoms consistent with sciatica.  He spent Christmas Eve in ER, returned on Christmas Day for persistent severe pain and finally received an IM steroid injection. His back pain has improved.  2) Persistent ear pain and loss of hearing on the right, accompanied by sinus congestion and cough productive of brown sputum..  Was treated by Urgent care for cerumen impaction and referred to ENT, appt pending.   3) CHF:  He stopped his diuretic for a few days over the holidays when he was feeling so poorly and promptly developed fluid retention. He has resumed 50 mg of furosemide daily for the last 2 days and has had increased urinary output and good diuresis.       Past Medical History  Diagnosis Date  . Hypertension   . Hyperlipidemia   . Coronary  artery disease     lbbb  . PAD (peripheral artery disease)   . Diabetes mellitus     poorly controlled  . Anxiety   . Tobacco abuse   . Peripheral vascular disease     with prior stents placed bilaterally  . MI (myocardial infarction) 2000  . Anemia   . COPD exacerbation   . Acute renal failure     due to ATN versus hypovolemia  . Acute exacerbation of CHF (congestive heart failure)     EF 25%,    Past Surgical History  Procedure Date  . Coronary artery bypass graft     x4  . Stents in legs 03/2010    [facility]  . Cholecystectomy   . Cardiac catheterization 2010    [place]  . Cataract extraction   . Cataract extraction, bilateral 2013  . Iliac artery stent   . External iliac     stent, left          The following portions of the patient's history were reviewed and updated as appropriate: Allergies, current medications, and problem list.    Review of Systems:   Patient denies  fevers,unintentional weight loss, skin rash, eye pain, dysphagia,  hemoptysis , chest pain, palpitations, orthopnea,  abdominal pain, nausea, melena, diarrhea, constipation, flank pain, dysuria, hematuria, urinary  Frequency, nocturia, numbness,  tingling, seizures,  Focal weakness, Loss of consciousness,   insomnia, , anxiety, and suicidal ideation.         History   Social History  . Marital Status: Single    Spouse Name: N/A    Number of Children: N/A  . Years of Education: N/A   Occupational History  . Not on file.   Social History Main Topics  . Smoking status: Former Smoker -- 0.5 packs/day for 30 years    Types: Cigarettes  . Smokeless tobacco: Never Used     Comment: RECENTLY STOPPED  . Alcohol Use: No  . Drug Use: No  . Sexually Active: Not on file   Other Topics Concern  . Not on file   Social History Narrative  . No narrative on file    Objective:  BP 150/68  Pulse 75  Temp 98.2 F (36.8 C) (Oral)  Resp 16  Wt 168 lb (76.204 kg)   SpO2 94%  General appearance: alert, cooperative and appears stated age Ears: normal TM's and external ear canals both ears Throat: lips, mucosa, and tongue normal; teeth and gums normal Neck: no adenopathy, no carotid bruit, supple, symmetrical, trachea midline and thyroid not enlarged, symmetric, no tenderness/mass/nodules Back: symmetric, no curvature. ROM normal. No CVA tenderness. Lungs: clear to auscultation bilaterally Heart: regular rate and rhythm, S1, S2 normal, no murmur, click, rub or gallop Abdomen: soft, non-tender; bowel sounds normal; no masses,  no organomegaly Pulses: 2+ and symmetric Skin: Skin color, texture, turgor normal. No rashes or lesions Lymph nodes: Cervical, supraclavicular, and axillary nodes normal.  Assessment and Plan:  Cardiomyopathy, ischemic With recent chf exacerbation secondary to dietary intolerance. Continue high dose lasix until he is back to baseline weight. Renal function and chest x ray ordered.    Anxiety He is tolerating Remeron which seems to be helping his depression, appetite and insomnia. We decided  To increase his remeron to 22. 5 mg daily at bedtime.   TOBACCO ABUSE He is still using tobacco on a daily basis although he minimizes his use.  Tobacco abuse counseling I have again counseled him on the limitations of modern science in improving his quality of life given his ongoing tobacco use  In the dace of severe coronary artery, disease peripheral vascular disease, and COPD.  Bronchitis with chronic airway obstruction Secondary to upper respiratory infection with sinusitis and otitis noted. Levaquin and prednisone prescribed. Delsym for cough. Continue long-acting bronchodilator and a short acting bronchodilator for scattered lower lobe wheezes.    Updated Medication List Outpatient Encounter Prescriptions as of 10/06/2012  Medication Sig Dispense Refill  . aspirin 81 MG tablet Take 81 mg by mouth daily.      . carvedilol (COREG)  12.5 MG tablet Take 12.5 mg by mouth 2 (two) times daily with a meal.      . dimenhyDRINATE (DRAMAMINE) 50 MG tablet Take 50 mg by mouth every 8 (eight) hours as needed.        . ferrous sulfate 325 (65 FE) MG EC tablet Take 325 mg by mouth daily with breakfast.        . furosemide (LASIX) 20 MG tablet TAKE 1 TABLET BY MOUTH TWICE DAILY  60 tablet  3  . gabapentin (NEURONTIN) 300 MG capsule TAKE ONE CAPSULE BY MOUTH EVERY DAY  30 capsule  0  . insulin aspart protamine-insulin aspart (NOVOLOG 70/30) (70-30) 100 UNIT/ML injection Inject 25 Units into the skin 2 (two) times daily with a meal.  10 mL  12  . Insulin Syringe-Needle U-100 (INSULIN SYRINGE 1CC/30GX5/16") 30G X 5/16" 1 ML MISC USE FOUR TIMES DAILY  200 each  7  . ipratropium-albuterol (DUONEB) 0.5-2.5 (3) MG/3ML SOLN Take 3 mLs by nebulization every 4 (four) hours as needed.      . isosorbide mononitrate (IMDUR) 30 MG 24 hr tablet Take 1 tablet (30 mg total) by mouth daily.  30 tablet  3  . meloxicam (MOBIC) 15 MG tablet Take 1 tablet (15 mg total) by mouth daily.  30 tablet  5  . methocarbamol (ROBAXIN) 500 MG tablet Take 1 tablet (500 mg total) by mouth 3 (three) times daily. As needed for back pain  30 tablet  3  . mirtazapine (REMERON) 15 MG tablet Take 1.5 tablets (22.5 mg total) by mouth at bedtime.  45 tablet  5  . NEXIUM 40 MG capsule TAKE 1 CAPSULE BY MOUTH EVERY DAY  30 capsule  5  . nitroGLYCERIN (NITROSTAT) 0.4 MG SL tablet Place 1 tablet (0.4 mg total) under the tongue every 5 (five) minutes as needed.  30 tablet  6  . OxyCODONE HCl ER (OXYCONTIN) 60 MG T12A Take 1 tablet by mouth every 12 (twelve) hours as needed.  60 each  0  . oxyCODONE-acetaminophen (PERCOCET/ROXICET) 5-325 MG per tablet Take 1 tablet by mouth every 6 (six) hours as needed.  120 tablet  0  . PROAIR HFA 108 (90 BASE) MCG/ACT inhaler INHALE 2 PUFFS BY MOUTH EVERY 6 HOURS AS NEEDED FOR WHEEZING OR SHORTNESS OF BREATH  8.5 g  2  . simvastatin (ZOCOR) 40 MG  tablet TAKE 1 TABLET BY MOUTH EVERY NIGHT AT BEDTIME FOR CHOLESTEROL  30 tablet  6  . SPIRIVA HANDIHALER 18 MCG inhalation capsule INHALE CONTENTS OF ONE CAPSULE ONCE DAILY USING HANDIHALER  30 capsule  3  . [DISCONTINUED] mirtazapine (REMERON) 15 MG tablet TAKE 1 TABLET BY MOUTH EVERY NIGHT AT BEDTIME  30 tablet  5  . [DISCONTINUED] OxyCODONE HCl ER (OXYCONTIN) 60 MG T12A Take 1 tablet by mouth every 12 (twelve) hours as needed.  60 each  0  . [DISCONTINUED] OxyCODONE HCl ER (OXYCONTIN) 60 MG T12A Take 1 tablet by mouth every 12 (twelve) hours as needed.  60 each  0  . [DISCONTINUED] oxyCODONE-acetaminophen (PERCOCET/ROXICET) 5-325 MG per tablet Take 1 tablet by mouth every 6 (six) hours as needed.  120 tablet  0  . [DISCONTINUED] oxyCODONE-acetaminophen (PERCOCET/ROXICET) 5-325 MG per tablet Take 1 tablet by mouth every 6 (six) hours as needed.  120 tablet  0  . insulin glargine (LANTUS) 100 UNIT/ML injection Inject 40 Units into the skin at bedtime.  10 mL  6  . isosorbide mononitrate (IMDUR) 60 MG 24 hr tablet TAKE 1 TABLET BY MOUTH EVERY DAY  30 tablet  11  . levofloxacin (LEVAQUIN) 500 MG tablet Take 1 tablet (500 mg total) by mouth daily.  7 tablet  0  . predniSONE (STERAPRED UNI-PAK) 10 MG tablet 6 tablets on day 1, decrease by tablet daily until gone  21 tablet  0     Orders Placed This Encounter  Procedures  . DG Chest 2 View    No Follow-up on file.

## 2012-10-06 NOTE — Patient Instructions (Addendum)
Let's try increasing the bedtime mirtazipine to 1.5 tablets.   Continue 50 mg of furosemide until you hear from me about the chest xay and labs     You have a sinus/ear infection but I am concerned that you may be developing bronchitis .  I am prescribing an antibiotic (levaquin ) and prednisone taper  To manage the infectin and the inflammation in your ear/sinuses and ordering a chest x ray   I also advise use of the following OTC meds to help with your other symptoms.   Take generic OTC benadryl 25 mg every 8 hours for the drainage,  Sudafed PE  10 to 30 mg every 8 hours for the congestion, you may substitute Afrin nasal spray for the nighttime dose of sudafed PE  If needed to prevent insomnia.  flushes your sinuses twice daily with Simply Saline (do over the sink because if you do it right you will spit out globs of mucus)  Use Robitussin or Delsym   FOR THE COUGH.  Gargle with salt water as needed for sore throat.

## 2012-10-07 ENCOUNTER — Encounter: Payer: Self-pay | Admitting: Internal Medicine

## 2012-10-07 DIAGNOSIS — J449 Chronic obstructive pulmonary disease, unspecified: Secondary | ICD-10-CM | POA: Insufficient documentation

## 2012-10-07 MED ORDER — MIRTAZAPINE 15 MG PO TABS: 22.5000 mg | ORAL_TABLET | Freq: Every day | ORAL | Status: DC

## 2012-10-07 NOTE — Assessment & Plan Note (Signed)
He is still using tobacco on a daily basis although he minimizes his use.

## 2012-10-07 NOTE — Assessment & Plan Note (Signed)
Secondary to upper respiratory infection with sinusitis and otitis noted. Levaquin and prednisone prescribed. Delsym for cough. Continue long-acting bronchodilator and a short acting bronchodilator for scattered lower lobe wheezes.

## 2012-10-07 NOTE — Assessment & Plan Note (Signed)
With recent chf exacerbation secondary to dietary intolerance. Continue high dose lasix until he is back to baseline weight. Renal function ordered.

## 2012-10-07 NOTE — Assessment & Plan Note (Signed)
I have again counseled him on the limitations of modern science in improving his quality of life given his ongoing tobacco use  In the dace of severe coronary artery, disease peripheral vascular disease, and COPD.

## 2012-10-07 NOTE — Assessment & Plan Note (Signed)
He is tolerating Remeron which seems to be helping his depression, appetite and insomnia. We decided  To increase his remeron to 22. 5 mg daily at bedtime.

## 2012-10-09 ENCOUNTER — Telehealth: Payer: Self-pay | Admitting: Internal Medicine

## 2012-10-09 NOTE — Telephone Encounter (Signed)
Pt daughter notified   

## 2012-10-09 NOTE — Telephone Encounter (Signed)
Chest x ray was positive for congestive heart failure and kidney function is stable.  continue furosemide dose at 50 mg daily utnil his weight is down to 165 lbs.  Then resume 20 mg daily dose.

## 2012-10-16 ENCOUNTER — Encounter: Payer: Self-pay | Admitting: Internal Medicine

## 2012-10-18 MED ORDER — DOXYCYCLINE HYCLATE 100 MG PO TABS: 100.0000 mg | ORAL_TABLET | Freq: Two times a day (BID) | ORAL | Status: AC

## 2012-10-21 ENCOUNTER — Other Ambulatory Visit: Payer: Self-pay | Admitting: Internal Medicine

## 2012-10-24 ENCOUNTER — Encounter: Payer: Self-pay | Admitting: Internal Medicine

## 2012-10-30 ENCOUNTER — Ambulatory Visit: Payer: Medicare Other | Admitting: Internal Medicine

## 2012-10-31 ENCOUNTER — Other Ambulatory Visit: Payer: Self-pay | Admitting: Internal Medicine

## 2012-10-31 NOTE — Telephone Encounter (Signed)
Med filled.  

## 2012-11-03 ENCOUNTER — Telehealth: Payer: Self-pay | Admitting: *Deleted

## 2012-11-03 NOTE — Telephone Encounter (Signed)
Called [phone] for prior authorization on Oxycontin, form is being faxed over now

## 2012-11-06 ENCOUNTER — Other Ambulatory Visit: Payer: Self-pay | Admitting: *Deleted

## 2012-11-06 MED ORDER — CARVEDILOL 12.5 MG PO TABS: 12.5000 mg | ORAL_TABLET | Freq: Two times a day (BID) | ORAL | Status: DC

## 2012-11-06 NOTE — Telephone Encounter (Signed)
Med filled.  

## 2012-11-07 ENCOUNTER — Telehealth: Payer: Self-pay | Admitting: *Deleted

## 2012-11-07 ENCOUNTER — Encounter: Payer: Self-pay | Admitting: Internal Medicine

## 2012-11-07 NOTE — Telephone Encounter (Signed)
Received the approval form for the Oxycontin from 01.04.2014 until 02.03.2015

## 2012-11-07 NOTE — Telephone Encounter (Signed)
PA has been submitted on this PT.

## 2012-11-08 ENCOUNTER — Other Ambulatory Visit: Payer: Self-pay | Admitting: Internal Medicine

## 2012-11-08 NOTE — Telephone Encounter (Signed)
Med filled.  

## 2012-11-09 ENCOUNTER — Other Ambulatory Visit: Payer: Self-pay | Admitting: Internal Medicine

## 2012-11-09 NOTE — Telephone Encounter (Signed)
Med filled.  

## 2012-11-25 ENCOUNTER — Other Ambulatory Visit: Payer: Self-pay | Admitting: Internal Medicine

## 2012-11-27 ENCOUNTER — Emergency Department: Payer: Self-pay | Admitting: Unknown Physician Specialty

## 2012-11-27 ENCOUNTER — Other Ambulatory Visit: Payer: Self-pay | Admitting: *Deleted

## 2012-11-27 LAB — COMPREHENSIVE METABOLIC PANEL
Albumin: 3.4 g/dL (ref 3.4–5.0)
Alkaline Phosphatase: 107 U/L (ref 50–136)
Anion Gap: 3 — ABNORMAL LOW (ref 7–16)
BUN: 38 mg/dL — ABNORMAL HIGH (ref 7–18)
Calcium, Total: 8.7 mg/dL (ref 8.5–10.1)
Creatinine: 1.83 mg/dL — ABNORMAL HIGH (ref 0.60–1.30)
EGFR (African American): 43 — ABNORMAL LOW
Glucose: 265 mg/dL — ABNORMAL HIGH (ref 65–99)
SGPT (ALT): 10 U/L — ABNORMAL LOW (ref 12–78)
Sodium: 134 mmol/L — ABNORMAL LOW (ref 136–145)

## 2012-11-27 LAB — URINALYSIS, COMPLETE
Hyaline Cast: 5
Nitrite: NEGATIVE
Protein: 100
Specific Gravity: 1.019 (ref 1.003–1.030)
Squamous Epithelial: <1
WBC UR: 2 /HPF (ref 0–5)

## 2012-11-27 LAB — CBC
HCT: 36.7 % — ABNORMAL LOW (ref 40.0–52.0)
HGB: 11.6 g/dL — ABNORMAL LOW (ref 13.0–18.0)
MCH: 27.2 pg (ref 26.0–34.0)
MCHC: 31.5 g/dL — ABNORMAL LOW (ref 32.0–36.0)
MCV: 87 fL (ref 80–100)
Platelet: 273 10*3/uL (ref 150–440)
RBC: 4.24 10*6/uL — ABNORMAL LOW (ref 4.40–5.90)
RDW: 17 % — ABNORMAL HIGH (ref 11.5–14.5)
WBC: 8.6 10*3/uL (ref 3.8–10.6)

## 2012-11-27 LAB — CK TOTAL AND CKMB (NOT AT ARMC): CK-MB: 3.9 ng/mL — ABNORMAL HIGH (ref 0.5–3.6)

## 2012-11-28 MED ORDER — ESOMEPRAZOLE MAGNESIUM 40 MG PO CPDR: DELAYED_RELEASE_CAPSULE | ORAL | Status: DC

## 2012-11-28 NOTE — Telephone Encounter (Signed)
Med filled.  

## 2012-11-29 ENCOUNTER — Ambulatory Visit: Payer: Self-pay | Admitting: Ophthalmology

## 2012-11-29 LAB — POTASSIUM: Potassium: 5.7 mmol/L — ABNORMAL HIGH (ref 3.5–5.1)

## 2012-11-29 LAB — HEMOGLOBIN: HGB: 11.1 g/dL — ABNORMAL LOW (ref 13.0–18.0)

## 2012-11-30 ENCOUNTER — Telehealth: Payer: Self-pay | Admitting: Internal Medicine

## 2012-11-30 DIAGNOSIS — E875 Hyperkalemia: Secondary | ICD-10-CM

## 2012-11-30 MED ORDER — SODIUM POLYSTYRENE SULFONATE 15 GM/60ML PO SUSP: 15.0000 g | Freq: Four times a day (QID) | ORAL | Status: DC | PRN

## 2012-11-30 NOTE — Telephone Encounter (Signed)
This is a copy of an e mail I sent to patient.  Please call to make sure he gets this message:   Patrick [name],   I received recent labs drawn on you by the ER  [facility] that your potassium was elevated again.   You need to get your potassium level down by taking a liquid medicine Kayexalate that I sent to [employer]. It will work by causing you to have a loose stool which will lower your potassium.  Take thse medicine every 6 hours until you have a loose stool.  Then continue one dose daily until Monday.   Please stop any potassium supplements you may be taking.  You need to come by the office on Monday to have your potassium rechecked and we will give you information on how to follow a low potassium diet . You were given this information last July.

## 2012-11-30 NOTE — Assessment & Plan Note (Signed)
Recurrent.  kayexelate rxd and repeat BMET ordered.

## 2012-12-01 ENCOUNTER — Ambulatory Visit: Payer: Medicare Other | Admitting: Cardiovascular Disease

## 2012-12-01 NOTE — Telephone Encounter (Signed)
Called all phone lines including daughter and LMOVM to return call.

## 2012-12-01 NOTE — Telephone Encounter (Signed)
Pt daughter aware. Pt has lab appt Monday 3/3 at 4pm.

## 2012-12-04 ENCOUNTER — Other Ambulatory Visit: Payer: Self-pay | Admitting: Internal Medicine

## 2012-12-04 ENCOUNTER — Other Ambulatory Visit: Payer: Medicare Other

## 2012-12-04 NOTE — Telephone Encounter (Signed)
Called pharmacy . celexa denied as per MD---pharmacist to advise patient to call our office.

## 2012-12-04 NOTE — Telephone Encounter (Signed)
Received refill request electronically. Medication is not on med sheet. Is it okay to refill medication? 

## 2012-12-05 ENCOUNTER — Other Ambulatory Visit: Payer: Medicare Other

## 2012-12-07 ENCOUNTER — Other Ambulatory Visit: Payer: Self-pay | Admitting: Internal Medicine

## 2012-12-07 NOTE — Telephone Encounter (Signed)
Received refill request electronically. Last office visit 10/06/12. Is it okay to refill? Please confirm number of refills.

## 2012-12-08 ENCOUNTER — Other Ambulatory Visit: Payer: Medicare Other

## 2012-12-11 ENCOUNTER — Telehealth: Payer: Self-pay | Admitting: Internal Medicine

## 2012-12-11 ENCOUNTER — Other Ambulatory Visit (INDEPENDENT_AMBULATORY_CARE_PROVIDER_SITE_OTHER): Payer: Medicare Other

## 2012-12-11 DIAGNOSIS — E875 Hyperkalemia: Secondary | ICD-10-CM

## 2012-12-11 NOTE — Telephone Encounter (Signed)
Pt needs refill on oxicoton and oxicodine Please advise when ready

## 2012-12-12 ENCOUNTER — Ambulatory Visit: Payer: Self-pay | Admitting: Ophthalmology

## 2012-12-12 ENCOUNTER — Other Ambulatory Visit: Payer: Self-pay | Admitting: Internal Medicine

## 2012-12-12 ENCOUNTER — Emergency Department: Payer: Self-pay | Admitting: Emergency Medicine

## 2012-12-12 ENCOUNTER — Telehealth: Payer: Self-pay | Admitting: *Deleted

## 2012-12-12 ENCOUNTER — Ambulatory Visit: Payer: Medicare Other | Admitting: Internal Medicine

## 2012-12-12 LAB — CBC
HCT: 32.9 % — ABNORMAL LOW (ref 40.0–52.0)
MCHC: 32 g/dL (ref 32.0–36.0)
MCV: 86 fL (ref 80–100)
RBC: 3.84 10*6/uL — ABNORMAL LOW (ref 4.40–5.90)
RDW: 16.3 % — ABNORMAL HIGH (ref 11.5–14.5)
WBC: 8.6 10*3/uL (ref 3.8–10.6)

## 2012-12-12 LAB — BASIC METABOLIC PANEL
Anion Gap: 4 — ABNORMAL LOW (ref 7–16)
BUN: 31 mg/dL — ABNORMAL HIGH (ref 7–18)
Chloride: 112 mmol/L — ABNORMAL HIGH (ref 98–107)
Co2: 25 mmol/L (ref 21–32)
Creatinine: 1.6 mg/dL — ABNORMAL HIGH (ref 0.60–1.30)
EGFR (Non-African Amer.): 43 — ABNORMAL LOW
Osmolality: 299 (ref 275–301)

## 2012-12-12 LAB — BASIC METABOLIC PANEL WITH GFR
BUN: 30 mg/dL — ABNORMAL HIGH (ref 6–23)
CO2: 23 meq/L (ref 19–32)
Calcium: 8.6 mg/dL (ref 8.4–10.5)
Chloride: 108 meq/L (ref 96–112)
Creatinine, Ser: 1.7 mg/dL — ABNORMAL HIGH (ref 0.4–1.5)
GFR: 44.09 mL/min — ABNORMAL LOW
Glucose, Bld: 145 mg/dL — ABNORMAL HIGH (ref 70–99)
Potassium: 6.1 meq/L (ref 3.5–5.1)
Sodium: 139 meq/L (ref 135–145)

## 2012-12-12 LAB — CK TOTAL AND CKMB (NOT AT ARMC)
CK, Total: 161 U/L (ref 35–232)
CK-MB: 6.2 ng/mL — ABNORMAL HIGH (ref 0.5–3.6)

## 2012-12-12 MED ORDER — OXYCODONE-ACETAMINOPHEN 5-325 MG PO TABS: 1.0000 | ORAL_TABLET | Freq: Four times a day (QID) | ORAL | Status: DC | PRN

## 2012-12-12 MED ORDER — OXYCODONE HCL ER 60 MG PO T12A: 1.0000 | EXTENDED_RELEASE_TABLET | Freq: Two times a day (BID) | ORAL | Status: DC | PRN

## 2012-12-12 NOTE — Telephone Encounter (Signed)
Daughter, Ines Bloomer, returned call and notified of pt critical potassium and need for ER visit.

## 2012-12-12 NOTE — Telephone Encounter (Signed)
PT notified cannot reach his daughter to notify will continue to call.

## 2012-12-12 NOTE — Telephone Encounter (Signed)
Critical Potassium 6.1

## 2012-12-12 NOTE — Telephone Encounter (Signed)
His potassium is critically high ,  He needs to go to the ER to have it  Treated ASAO as it can cause abnormal heart rhythms.

## 2012-12-13 ENCOUNTER — Telehealth: Payer: Self-pay | Admitting: Internal Medicine

## 2012-12-13 MED ORDER — MAGNESIUM OXIDE 400 MG PO TABS: 400.0000 mg | ORAL_TABLET | Freq: Two times a day (BID) | ORAL | Status: DC

## 2012-12-13 MED ORDER — SODIUM POLYSTYRENE SULFONATE 15 GM/60ML PO SUSP: 15.0000 g | Freq: Two times a day (BID) | ORAL | Status: DC

## 2012-12-13 NOTE — Telephone Encounter (Signed)
Pt went to ER and stated that they did not do anything for him. He has an appt tomorrow at 2pm to receive his magnesium injection.

## 2012-12-13 NOTE — Telephone Encounter (Signed)
Placed up front ready for pick up. Left a message with daughter.

## 2012-12-13 NOTE — Telephone Encounter (Signed)
HI did not receive paperwork in red or yellow folder.  Please find for me and see prior note from me regarding his magnesium being low.   he  needs to have his magesium supplemented as well .  2 gram IM injection ASAP plus daily oral supplements   He needs to take kayexelate twice daily until he has a loose stool, then continue once daily dosing. and follow a low potassium diet to keep his potassium down.  If he doesn't he is going to have a fatal heart rhyhthm.

## 2012-12-13 NOTE — Telephone Encounter (Signed)
Daughter calling to see if the prescription's for her father are ready. OXYCONTIN and PERCOCET.

## 2012-12-13 NOTE — Telephone Encounter (Signed)
Paperwork placed in your folder.

## 2012-12-13 NOTE — Telephone Encounter (Signed)
Pt daughter Ines Bloomer called wanting to get labs for potassium done.  Mr goyne went to er last night his potassium was 5.7  Mr wandel is taking caionex. Please advise     pt has hospital follow up on 3/17

## 2012-12-13 NOTE — Telephone Encounter (Signed)
Elizabeth from ER called to inform that Mr. Patrick [name] was seen in ER but left AMA.  States his potassium was 5.7, MB elevated.  They advised the pt to at least be seen back here.

## 2012-12-14 ENCOUNTER — Ambulatory Visit (INDEPENDENT_AMBULATORY_CARE_PROVIDER_SITE_OTHER): Payer: Medicare Other | Admitting: *Deleted

## 2012-12-14 DIAGNOSIS — R790 Abnormal level of blood mineral: Secondary | ICD-10-CM

## 2012-12-14 MED ORDER — MAGNESIUM SULFATE 50 % IJ SOLN
1.0000 g | Freq: Once | INTRAMUSCULAR | Status: AC
  Administered 2012-12-14: 1 g via INTRAMUSCULAR

## 2012-12-18 ENCOUNTER — Encounter: Payer: Self-pay | Admitting: Internal Medicine

## 2012-12-18 ENCOUNTER — Ambulatory Visit (INDEPENDENT_AMBULATORY_CARE_PROVIDER_SITE_OTHER): Payer: Medicare Other | Admitting: Internal Medicine

## 2012-12-18 ENCOUNTER — Encounter: Payer: Medicare Other | Admitting: Internal Medicine

## 2012-12-18 VITALS — BP 150/82 | HR 93 | Temp 98.2°F | Resp 16 | Wt 177.2 lb

## 2012-12-18 DIAGNOSIS — N058 Unspecified nephritic syndrome with other morphologic changes: Secondary | ICD-10-CM

## 2012-12-18 DIAGNOSIS — E875 Hyperkalemia: Secondary | ICD-10-CM

## 2012-12-18 DIAGNOSIS — E1329 Other specified diabetes mellitus with other diabetic kidney complication: Secondary | ICD-10-CM

## 2012-12-18 NOTE — Progress Notes (Signed)
Patient ID: Patrick [name], male   DOB: 1941/01/15, 72 y.o.   MRN: [id number]  Patient Active Problem List  Diagnosis  . HYPERLIPIDEMIA-MIXED  . TOBACCO ABUSE  . HYPERTENSION, BENIGN  . CAD, AUTOLOGOUS BYPASS GRAFT  . EDEMA  . Diabetes mellitus type 2 with complications, uncontrolled  . Anxiety  . Peripheral vascular disease  . Anemia  . Cardiomyopathy, ischemic  . Tobacco abuse counseling  . Hyperkalemia  . Chronic kidney disease (CKD), stage III (moderate)  . Depression with anxiety  . Bronchitis with chronic airway obstruction    Subjective:  CC:   Chief Complaint  Patient presents with  . Follow-up    [facility] Discharge    HPI:   Patrick [name] a 72 y.o. male who presents for follow up on ER for hyperkalemia .  He left the ER before any treatment was given after 6 hours of waiting .  Stopped taking the kayexelate on Friday and start using ex lax because he thought the point was to have a bowel movement .  He feels fine,  Has no shortness of breath, nausea or chest pain .    Had cataract surgery on right eye last Tuesday by [facility].    Regarding his diabetes mellitus, he has been using the 70/30 novolog insulin with dose changes based on level,  Taking it twice daily  Of 70/30  15 to 25 units depending on level. 10 units for 200, etc   Past Medical History  Diagnosis Date  . Hypertension   . Hyperlipidemia   . Coronary artery disease     lbbb  . PAD (peripheral artery disease)   . Diabetes mellitus     poorly controlled  . Anxiety   . Tobacco abuse   . Peripheral vascular disease     with prior stents placed bilaterally  . MI (myocardial infarction) 2000  . Anemia   . COPD exacerbation   . Acute renal failure     due to ATN versus hypovolemia  . Acute exacerbation of CHF (congestive heart failure)     EF 25%,    Past Surgical History  Procedure Laterality Date  . Coronary artery bypass graft      x4  . Stents in legs  03/2010    [facility]  .  Cholecystectomy    . Cardiac catheterization  2010    [place]  . Cataract extraction    . Cataract extraction, bilateral  2013  . Iliac artery stent    . External iliac      stent, left     The following portions of the patient's history were reviewed and updated as appropriate: Allergies, current medications, and problem list.  Review of Systems:   Patient denies headache, fevers, malaise, unintentional weight loss, skin rash, eye pain, sinus congestion and sinus pain, sore throat, dysphagia,  hemoptysis , cough, dyspnea, wheezing, chest pain, palpitations, orthopnea, edema, abdominal pain, nausea, melena, diarrhea, constipation, flank pain, dysuria, hematuria, urinary  Frequency, nocturia, numbness, tingling, seizures,  Focal weakness, Loss of consciousness,  Tremor, insomnia, depression, anxiety, and suicidal ideation.     History   Social History  . Marital Status: Single    Spouse Name: N/A    Number of Children: N/A  . Years of Education: N/A   Occupational History  . Not on file.   Social History Main Topics  . Smoking status: Former Smoker -- 0.50 packs/day for 30 years    Types: Cigarettes  .  Smokeless tobacco: Never Used     Comment: RECENTLY STOPPED  . Alcohol Use: No  . Drug Use: No  . Sexually Active: Not on file   Other Topics Concern  . Not on file   Social History Narrative  . No narrative on file    Objective:  BP 150/82  Pulse 93  Temp(Src) 98.2 F (36.8 C) (Oral)  Resp 16  Wt 177 lb 4 oz (80.4 kg)  BMI 25.43 kg/m2  SpO2 95%  General appearance: alert, cooperative and appears stated age Ears: normal TM's and external ear canals both ears Throat: lips, mucosa, and tongue normal; teeth and gums normal Neck: no adenopathy, no carotid bruit, supple, symmetrical, trachea midline and thyroid not enlarged, symmetric, no tenderness/mass/nodules Back: symmetric, no curvature. ROM normal. No CVA tenderness. Lungs: clear to auscultation  bilaterally Heart: regular rate and rhythm, S1, S2 normal, no murmur, click, rub or gallop Abdomen: soft, non-tender; bowel sounds normal; no masses,  no organomegaly Pulses: 2+ and symmetric Skin: Skin color, texture, turgor normal. No rashes or lesions Lymph nodes: Cervical, supraclavicular, and axillary nodes normal.  Assessment and Plan:  Hyperkalemia Secondary to CKD and chronic narcotic use. Repeat potassium level is pending,  continue daily Kayexelate and low potassium diet.   Chronic kidney disease (CKD), stage III (moderate) Secondary to hypertension and diabetes  Kidney function has declined.  Referral to Dr. Cherylann Ratel    Updated Medication List Outpatient Encounter Prescriptions as of 12/18/2012  Medication Sig Dispense Refill  . aspirin 81 MG tablet Take 81 mg by mouth daily.      . carvedilol (COREG) 12.5 MG tablet Take 1 tablet (12.5 mg total) by mouth 2 (two) times daily with a meal.  60 tablet  6  . dimenhyDRINATE (DRAMAMINE) 50 MG tablet Take 50 mg by mouth every 8 (eight) hours as needed.        . DUREZOL 0.05 % EMUL       . esomeprazole (NEXIUM) 40 MG capsule TAKE 1 CAPSULE BY MOUTH EVERY DAY  30 capsule  3  . ferrous sulfate 325 (65 FE) MG EC tablet Take 325 mg by mouth daily with breakfast.        . furosemide (LASIX) 20 MG tablet TAKE 1 TABLET BY MOUTH TWICE DAILY  60 tablet  3  . gabapentin (NEURONTIN) 300 MG capsule TAKE 1 CAPSULE BY MOUTH EVERY DAY  30 capsule  0  . insulin aspart protamine-insulin aspart (NOVOLOG 70/30) (70-30) 100 UNIT/ML injection Inject 25 Units into the skin 2 (two) times daily with a meal.  10 mL  12  . insulin glargine (LANTUS) 100 UNIT/ML injection Inject 40 Units into the skin at bedtime.  10 mL  6  . Insulin Syringe-Needle U-100 (INSULIN SYRINGE 1CC/30GX5/16") 30G X 5/16" 1 ML MISC USE FOUR TIMES DAILY  200 each  7  . ipratropium-albuterol (DUONEB) 0.5-2.5 (3) MG/3ML SOLN Take 3 mLs by nebulization every 4 (four) hours as needed.      .  isosorbide mononitrate (IMDUR) 30 MG 24 hr tablet Take 1 tablet (30 mg total) by mouth daily.  30 tablet  3  . isosorbide mononitrate (IMDUR) 60 MG 24 hr tablet TAKE 1 TABLET BY MOUTH EVERY DAY  30 tablet  11  . levofloxacin (LEVAQUIN) 500 MG tablet Take 1 tablet (500 mg total) by mouth daily.  7 tablet  0  . magnesium oxide (MAG-OX 400) 400 MG tablet Take 1 tablet (400 mg total) by mouth 2 (  two) times daily.  60 tablet  3  . meclizine (ANTIVERT) 25 MG tablet       . meloxicam (MOBIC) 15 MG tablet Take 1 tablet (15 mg total) by mouth daily.  30 tablet  5  . methocarbamol (ROBAXIN) 500 MG tablet Take 1 tablet (500 mg total) by mouth 3 (three) times daily. As needed for back pain  30 tablet  3  . mirtazapine (REMERON) 15 MG tablet Take 1.5 tablets (22.5 mg total) by mouth at bedtime.  45 tablet  5  . nitroGLYCERIN (NITROSTAT) 0.4 MG SL tablet Place 1 tablet (0.4 mg total) under the tongue every 5 (five) minutes as needed.  30 tablet  6  . OxyCODONE HCl ER (OXYCONTIN) 60 MG T12A Take 1 tablet by mouth every 12 (twelve) hours as needed.  60 each  0  . oxyCODONE-acetaminophen (PERCOCET/ROXICET) 5-325 MG per tablet Take 1 tablet by mouth every 6 (six) hours as needed.  120 tablet  0  . predniSONE (STERAPRED UNI-PAK) 10 MG tablet 6 tablets on day 1, decrease by tablet daily until gone  21 tablet  0  . PROAIR HFA 108 (90 BASE) MCG/ACT inhaler INHALE 2 PUFFS BY MOUTH EVERY 6 HOURS AS NEEDED FOR SHORTNESS OF BREATH OR WHEEZING  8.5 g  0  . simvastatin (ZOCOR) 40 MG tablet TAKE 1 TABLET BY MOUTH EVERY NIGHT AT BEDTIME FOR CHOLESTEROL  90 tablet  0  . sodium polystyrene (KAYEXALATE) 15 GM/60ML suspension Take 60 mLs (15 g total) by mouth 2 (two) times daily. Until he has a loose stool, then once daily thereafter  500 mL  2  . SPIRIVA HANDIHALER 18 MCG inhalation capsule INHALE CONTENTS OF ONE CAPSULE ONCE DAILY USING HANDIHALER  30 capsule  3  . [DISCONTINUED] gabapentin (NEURONTIN) 300 MG capsule TAKE 1 CAPSULE  BY MOUTH EVERY DAY  30 capsule  11   No facility-administered encounter medications on file as of 12/18/2012.     Orders Placed This Encounter  Procedures  . Basic Metabolic Panel (BMET)  . Magnesium  . Ambulatory referral to Nephrology    No Follow-up on file.

## 2012-12-18 NOTE — Telephone Encounter (Signed)
These were picked up.

## 2012-12-18 NOTE — Assessment & Plan Note (Signed)
Secondary to CKD and chronic narcotic use. Repeat potassium level is pending,  continue daily Kayexelate and low potassium diet.

## 2012-12-18 NOTE — Patient Instructions (Addendum)
I want you to stop the ex lax and use the kayexelate once daily 30 ml per dose   Continue the magnesium supplements twice daily with food. I will call you with the results of today's labs  We will be referring you to a kidney specialist    Come back in 2 weeks for a blood check   At 1:00 PM fasting,  No not take insulin that morning

## 2012-12-18 NOTE — Assessment & Plan Note (Signed)
Secondary to hypertension and diabetes  Kidney function has declined.  Referral to Dr. Cherylann Ratel

## 2012-12-19 ENCOUNTER — Encounter: Payer: Self-pay | Admitting: Internal Medicine

## 2012-12-19 LAB — BASIC METABOLIC PANEL
BUN: 22 mg/dL (ref 6–23)
Chloride: 105 mEq/L (ref 96–112)
GFR: 64.28 mL/min (ref >60.00)
Glucose, Bld: 349 mg/dL — ABNORMAL HIGH (ref 70–99)
Potassium: 4.6 mEq/L (ref 3.5–5.1)

## 2012-12-20 ENCOUNTER — Other Ambulatory Visit: Payer: Self-pay | Admitting: Internal Medicine

## 2012-12-22 ENCOUNTER — Encounter: Payer: Self-pay | Admitting: Cardiovascular Disease

## 2012-12-22 ENCOUNTER — Ambulatory Visit (INDEPENDENT_AMBULATORY_CARE_PROVIDER_SITE_OTHER): Payer: Medicare Other | Admitting: Cardiovascular Disease

## 2012-12-22 VITALS — BP 116/58 | HR 63 | Ht 70.0 in | Wt 177.0 lb

## 2012-12-22 DIAGNOSIS — I739 Peripheral vascular disease, unspecified: Secondary | ICD-10-CM

## 2012-12-22 DIAGNOSIS — I25810 Atherosclerosis of coronary artery bypass graft(s) without angina pectoris: Secondary | ICD-10-CM

## 2012-12-22 DIAGNOSIS — R0789 Other chest pain: Secondary | ICD-10-CM

## 2012-12-22 DIAGNOSIS — R0602 Shortness of breath: Secondary | ICD-10-CM

## 2012-12-22 DIAGNOSIS — E785 Hyperlipidemia, unspecified: Secondary | ICD-10-CM

## 2012-12-22 DIAGNOSIS — F17200 Nicotine dependence, unspecified, uncomplicated: Secondary | ICD-10-CM

## 2012-12-22 DIAGNOSIS — J441 Chronic obstructive pulmonary disease with (acute) exacerbation: Secondary | ICD-10-CM | POA: Insufficient documentation

## 2012-12-22 MED ORDER — AZITHROMYCIN 250 MG PO TABS: ORAL_TABLET | ORAL | Status: DC

## 2012-12-22 NOTE — Assessment & Plan Note (Signed)
We have encouraged him to continue to work on weaning his cigarettes and smoking cessation. He will continue to work on this and does not want any assistance with chantix.  

## 2012-12-22 NOTE — Assessment & Plan Note (Signed)
Currently with no symptoms of angina. No further workup at this time. Continue current medication regimen. 

## 2012-12-22 NOTE — Patient Instructions (Addendum)
  Please take an extra lasix to see if your cough gets better Start Z-pak two pills the first day then one a day for four more days  Please call us if you have new issues that need to be addressed before your next appt.  Your physician wants you to follow-up in: 6 months.  You will receive a reminder letter in the mail two months in advance. If you don't receive a letter, please call our office to schedule the follow-up appointment.

## 2012-12-22 NOTE — Assessment & Plan Note (Signed)
Cholesterol is at goal on the current lipid regimen. No changes to the medications were made.  

## 2012-12-22 NOTE — Progress Notes (Signed)
Patient ID: Patrick [name], male    DOB: 1941/02/14, 72 y.o.   MRN: [id number]  HPI Comments: [name] is a 72 year-old with a history of smoking who continues to smoke, coronary artery disease, MI in 2000 with CABG at that time, followup cardiac catheterization several years ago at [facility] that was reportedly "okay ", peripheral vascular disease with history of intervention to the legs in June 2011, history of GI bleed in 2011 requiring bypass fusion x4 who presented to [facility] in mid February 2013 with shortness of breath presenting over several weeks. He was admitted with systolic CHF. He also had renal dysfunction likely secondary to long-standing poorly controlled diabetes. Hemoglobin A1c was 10, now down to 9.  Echocardiogram at that time showed severely depressed and weak function of less than 25%, diastolic dysfunction, moderately elevated right ventricular systolic pressures  During his hospital course, heart failure regimen was started with low-dose ACE, beta blocker, diuretic with improvement of his symptoms.  ACE inhibitor held in the past secondary to renal insufficiency  not been taking his Lasix on a regular basis.   Overall he reports that he is feeling well, apart from recent cough with phlegm that is productive in the past 3-4 days. Some scratchy throat. He is very concerned about bronchitis and requesting antibiotic. Mild chronic shortness of breath.  EKG today shows normal sinus rhythm with rate 63 beats per minute with interventricular conduction delay, nonspecific ST and T wave abnormality in lead V5, V6  Outpatient Encounter Prescriptions as of 12/22/2012  Medication Sig Dispense Refill  . aspirin 81 MG tablet Take 81 mg by mouth daily.      . carvedilol (COREG) 12.5 MG tablet Take 1 tablet (12.5 mg total) by mouth 2 (two) times daily with a meal.  60 tablet  6  . dimenhyDRINATE (DRAMAMINE) 50 MG tablet Take 50 mg by mouth every 8 (eight) hours as needed.        . DUREZOL 0.05 %  EMUL       . esomeprazole (NEXIUM) 40 MG capsule TAKE 1 CAPSULE BY MOUTH EVERY DAY  30 capsule  3  . ferrous sulfate 325 (65 FE) MG EC tablet Take 325 mg by mouth daily with breakfast.        . furosemide (LASIX) 20 MG tablet TAKE 1 TABLET BY MOUTH TWICE DAILY  60 tablet  3  . gabapentin (NEURONTIN) 300 MG capsule TAKE 1 CAPSULE BY MOUTH EVERY DAY  30 capsule  0  . insulin aspart protamine-insulin aspart (NOVOLOG 70/30) (70-30) 100 UNIT/ML injection Inject 25 Units into the skin 2 (two) times daily with a meal.  10 mL  12  . Insulin Syringe-Needle U-100 (INSULIN SYRINGE 1CC/30GX5/16") 30G X 5/16" 1 ML MISC USE FOUR TIMES DAILY  200 each  7  . ipratropium-albuterol (DUONEB) 0.5-2.5 (3) MG/3ML SOLN Take 3 mLs by nebulization every 4 (four) hours as needed.      . isosorbide mononitrate (IMDUR) 30 MG 24 hr tablet Take 1 tablet (30 mg total) by mouth daily.  30 tablet  3  . magnesium oxide (MAG-OX 400) 400 MG tablet Take 1 tablet (400 mg total) by mouth 2 (two) times daily.  60 tablet  3  . meloxicam (MOBIC) 15 MG tablet Take 1 tablet (15 mg total) by mouth daily.  30 tablet  5  . methocarbamol (ROBAXIN) 500 MG tablet Take 1 tablet (500 mg total) by mouth 3 (three) times daily. As needed for back  pain  30 tablet  3  . mirtazapine (REMERON) 15 MG tablet Take 1.5 tablets (22.5 mg total) by mouth at bedtime.  45 tablet  5  . nitroGLYCERIN (NITROSTAT) 0.4 MG SL tablet Place 1 tablet (0.4 mg total) under the tongue every 5 (five) minutes as needed.  30 tablet  6  . Omega-3 Fatty Acids (FISH OIL) 1000 MG CAPS Take by mouth. Take two capsules daily.      . OxyCODONE HCl ER (OXYCONTIN) 60 MG T12A Take 1 tablet by mouth every 12 (twelve) hours as needed.  60 each  0  . oxyCODONE-acetaminophen (PERCOCET/ROXICET) 5-325 MG per tablet Take 1 tablet by mouth every 6 (six) hours as needed.  120 tablet  0  . PROAIR HFA 108 (90 BASE) MCG/ACT inhaler INHALE 2 PUFFS BY MOUTH EVERY 6 HOURS AS NEEDED FOR SHORTNESS OF BREATH  OR WHEEZING  8.5 g  0  . simvastatin (ZOCOR) 40 MG tablet TAKE 1 TABLET BY MOUTH EVERY NIGHT AT BEDTIME FOR CHOLESTEROL  90 tablet  0  . sodium polystyrene (KAYEXALATE) 15 GM/60ML suspension Take 60 mLs (15 g total) by mouth 2 (two) times daily. Until he has a loose stool, then once daily thereafter  500 mL  2  . SPIRIVA HANDIHALER 18 MCG inhalation capsule INHALE CONTENTS OF 1 CAPSULE VIA HANDIHALER ONCE DAILY  30 capsule  0    Review of Systems  Constitutional: Negative.   HENT: Negative.   Eyes: Positive for visual disturbance.  Respiratory: Positive for cough and shortness of breath.   Gastrointestinal: Negative.   Musculoskeletal: Negative.   Skin: Negative.   Neurological: Negative.   Psychiatric/Behavioral: Negative.   All other systems reviewed and are negative.   BP 116/58  Pulse 63  Ht 5\' 10"  (1.778 m)  Wt 177 lb (80.287 kg)  BMI 25.4 kg/m2  SpO2 94%  Physical Exam  Nursing note and vitals reviewed. Constitutional: He is oriented to person, place, and time. He appears well-developed and well-nourished.  HENT:  Head: Normocephalic.  Nose: Nose normal.  Mouth/Throat: Oropharynx is clear and moist.  Eyes: Conjunctivae are normal. Pupils are equal, round, and reactive to light.  Neck: Normal range of motion. Neck supple. No JVD present. Carotid bruit is present.  Cardiovascular: Normal rate, regular rhythm, S1 normal, S2 normal and intact distal pulses.  Exam reveals no gallop and no friction rub.   Murmur heard.  Crescendo systolic murmur is present with a grade of 2/6  Trace ankle edema.  Pulmonary/Chest: Effort normal. No respiratory distress. He has decreased breath sounds. He has no wheezes. He has no rales. He exhibits no tenderness.  Abdominal: Soft. Bowel sounds are normal. He exhibits no distension. There is no tenderness.  Musculoskeletal: Normal range of motion. He exhibits no edema and no tenderness.  Lymphadenopathy:    He has no cervical adenopathy.   Neurological: He is alert and oriented to person, place, and time. Coordination normal.  Skin: Skin is warm and dry. No rash noted. No erythema.  Psychiatric: He has a normal mood and affect. His behavior is normal. Judgment and thought content normal.      Assessment and Plan

## 2012-12-22 NOTE — Assessment & Plan Note (Signed)
60-79% disease on the right, 40-50% disease on the left.  Repeat in several months time

## 2012-12-22 NOTE — Assessment & Plan Note (Signed)
Presenting with several days of worsening cough, chest tightness, wheezing. Symptoms are mild to moderate. He is requesting antibiotic. Unable to exclude viral etiology and will give him a Z-Pak. We have asked him to call our office or Dr. Darrick Huntsman if symptoms do not start to resolve over the next 10 days.

## 2012-12-25 ENCOUNTER — Encounter: Payer: Self-pay | Admitting: Internal Medicine

## 2012-12-25 MED ORDER — ESCITALOPRAM OXALATE 10 MG PO TABS: 10.0000 mg | ORAL_TABLET | Freq: Every day | ORAL | Status: DC

## 2013-01-02 ENCOUNTER — Ambulatory Visit: Payer: Medicare Other | Admitting: Internal Medicine

## 2013-01-02 ENCOUNTER — Telehealth: Payer: Self-pay | Admitting: *Deleted

## 2013-01-02 NOTE — Telephone Encounter (Signed)
Called [phone] for prior authorization on the Novolog, form being faxed over now

## 2013-01-03 ENCOUNTER — Telehealth: Payer: Self-pay | Admitting: *Deleted

## 2013-01-03 MED ORDER — INSULIN LISPRO PROT & LISPRO (75-25 MIX) 100 UNIT/ML ~~LOC~~ SUSP: SUBCUTANEOUS | Status: DC

## 2013-01-03 MED ORDER — INSULIN PEN NEEDLE 32G X 6 MM MISC: Status: DC

## 2013-01-03 NOTE — Telephone Encounter (Signed)
Noted. Insulin changed to Humalog.

## 2013-01-03 NOTE — Telephone Encounter (Signed)
Prior authorization was denied on the novolog and suggest to try Humalog it wouldn't need a prior authorization

## 2013-01-04 ENCOUNTER — Telehealth: Payer: Self-pay | Admitting: Internal Medicine

## 2013-01-04 NOTE — Telephone Encounter (Signed)
Called pt and left a message regarding the medications.

## 2013-01-04 NOTE — Telephone Encounter (Signed)
Patient has humalog mix kwik pen 75/25 that were called into the pharmacy. The patient actually uses vials humalog 70/30 he draws up his own doses. Please call into the pharmacy ASAP. Please give a call back because his care giver has additional questions.

## 2013-01-05 ENCOUNTER — Other Ambulatory Visit: Payer: Self-pay | Admitting: Internal Medicine

## 2013-01-06 ENCOUNTER — Other Ambulatory Visit: Payer: Self-pay | Admitting: Internal Medicine

## 2013-01-06 ENCOUNTER — Other Ambulatory Visit: Payer: Self-pay | Admitting: Cardiovascular Disease

## 2013-01-06 MED ORDER — OXYCODONE-ACETAMINOPHEN 5-325 MG PO TABS: 1.0000 | ORAL_TABLET | Freq: Four times a day (QID) | ORAL | Status: DC | PRN

## 2013-01-06 MED ORDER — OXYCODONE HCL ER 60 MG PO T12A: 1.0000 | EXTENDED_RELEASE_TABLET | Freq: Two times a day (BID) | ORAL | Status: DC | PRN

## 2013-01-08 MED ORDER — INSULIN LISPRO PROT & LISPRO (75-25 MIX) 100 UNIT/ML ~~LOC~~ SUSP: 25.0000 [IU] | Freq: Two times a day (BID) | SUBCUTANEOUS | Status: DC

## 2013-01-08 MED ORDER — "INSULIN SYRINGE 30G X 5/16"" 1 ML MISC": Status: DC

## 2013-01-08 NOTE — Telephone Encounter (Signed)
Med refilled per pt request. Did not want the flexpen.

## 2013-01-10 ENCOUNTER — Institutional Professional Consult (permissible substitution): Payer: Medicare Other | Admitting: Internal Medicine

## 2013-01-11 ENCOUNTER — Telehealth: Payer: Self-pay | Admitting: Internal Medicine

## 2013-01-11 NOTE — Telephone Encounter (Signed)
Mr. Patrick [name] had labs done by Dr. Luther Redo on April 9th that I just received.  His potassium was extremely high again.  Was it addressed by Dr. Luther Redo?

## 2013-01-12 ENCOUNTER — Telehealth: Payer: Self-pay | Admitting: Internal Medicine

## 2013-01-12 NOTE — Telephone Encounter (Signed)
Please send the results of Labwork being completed on 4/15 to Dr. Encarnacion Chu office to [facility] fax# [phone].

## 2013-01-12 NOTE — Telephone Encounter (Signed)
Thanks. This is FYI. His labs were originally scheduled for 2pm in the afternoon.

## 2013-01-12 NOTE — Telephone Encounter (Signed)
Pt daughter states that Dr. Encarnacion Chu office gave him a medication with sodium in it and daughter was concerned. Will call back and advise the results.

## 2013-01-12 NOTE — Telephone Encounter (Signed)
Spoke with Babs Bertin at [facility] office. She stated that pt had stopped the [facility]. The office advised that he needed to resume and they also started pt on Vitamin D 50,000iu.

## 2013-01-12 NOTE — Telephone Encounter (Signed)
The patient will come in for blood work on 4.15.14 and have all his blood work done except his lipid panel. He will reschedule his lipid panel for an early morning visit. Per Jessica's instructions.

## 2013-01-16 ENCOUNTER — Other Ambulatory Visit (INDEPENDENT_AMBULATORY_CARE_PROVIDER_SITE_OTHER): Payer: Medicare Other

## 2013-01-16 DIAGNOSIS — N183 Chronic kidney disease, stage 3 unspecified: Secondary | ICD-10-CM

## 2013-01-16 DIAGNOSIS — N058 Unspecified nephritic syndrome with other morphologic changes: Secondary | ICD-10-CM

## 2013-01-16 DIAGNOSIS — E1329 Other specified diabetes mellitus with other diabetic kidney complication: Secondary | ICD-10-CM

## 2013-01-16 DIAGNOSIS — IMO0002 Reserved for concepts with insufficient information to code with codable children: Secondary | ICD-10-CM

## 2013-01-16 DIAGNOSIS — E1059 Type 1 diabetes mellitus with other circulatory complications: Secondary | ICD-10-CM

## 2013-01-16 DIAGNOSIS — R809 Proteinuria, unspecified: Secondary | ICD-10-CM

## 2013-01-16 LAB — COMPREHENSIVE METABOLIC PANEL
ALT: 21 U/L (ref 0–53)
BUN: 21 mg/dL (ref 6–23)
CO2: 32 mEq/L (ref 19–32)
Creatinine, Ser: 1.4 mg/dL (ref 0.4–1.5)
GFR: 54.62 mL/min — ABNORMAL LOW (ref >60.00)
Total Bilirubin: 0.4 mg/dL (ref 0.3–1.2)

## 2013-01-16 LAB — HEMOGLOBIN A1C: Hgb A1c MFr Bld: 10.1 % — ABNORMAL HIGH (ref 4.6–6.5)

## 2013-01-16 LAB — CBC WITH DIFFERENTIAL/PLATELET
Basophils Relative: 0.6 % (ref 0.0–3.0)
Eosinophils Relative: 3.2 % (ref 0.0–5.0)
HCT: 32.9 % — ABNORMAL LOW (ref 39.0–52.0)
Hemoglobin: 10.6 g/dL — ABNORMAL LOW (ref 13.0–17.0)
Lymphocytes Relative: 27.9 % (ref 12.0–46.0)
Lymphs Abs: 2.2 10*3/uL (ref 0.7–4.0)
Monocytes Relative: 6.7 % (ref 3.0–12.0)
Neutro Abs: 4.8 10*3/uL (ref 1.4–7.7)
RBC: 3.87 Mil/uL — ABNORMAL LOW (ref 4.22–5.81)
WBC: 7.9 10*3/uL (ref 4.5–10.5)

## 2013-01-16 LAB — LIPID PANEL
Cholesterol: 125 mg/dL (ref 0–200)
LDL Cholesterol: 78 mg/dL (ref 0–99)

## 2013-01-17 NOTE — Addendum Note (Signed)
Addended by: Sherlene Shams on: 01/17/2013 07:24 AM   Modules accepted: Orders

## 2013-01-18 ENCOUNTER — Other Ambulatory Visit: Payer: Self-pay | Admitting: Internal Medicine

## 2013-01-18 ENCOUNTER — Other Ambulatory Visit (INDEPENDENT_AMBULATORY_CARE_PROVIDER_SITE_OTHER): Payer: Medicare Other

## 2013-01-18 DIAGNOSIS — IMO0002 Reserved for concepts with insufficient information to code with codable children: Secondary | ICD-10-CM

## 2013-01-18 DIAGNOSIS — F419 Anxiety disorder, unspecified: Secondary | ICD-10-CM

## 2013-01-18 DIAGNOSIS — E118 Type 2 diabetes mellitus with unspecified complications: Secondary | ICD-10-CM

## 2013-01-18 DIAGNOSIS — N183 Chronic kidney disease, stage 3 unspecified: Secondary | ICD-10-CM

## 2013-01-18 DIAGNOSIS — E785 Hyperlipidemia, unspecified: Secondary | ICD-10-CM

## 2013-01-18 DIAGNOSIS — I10 Essential (primary) hypertension: Secondary | ICD-10-CM

## 2013-01-18 DIAGNOSIS — R609 Edema, unspecified: Secondary | ICD-10-CM

## 2013-01-18 DIAGNOSIS — E1165 Type 2 diabetes mellitus with hyperglycemia: Secondary | ICD-10-CM

## 2013-01-18 DIAGNOSIS — E875 Hyperkalemia: Secondary | ICD-10-CM

## 2013-01-18 DIAGNOSIS — F411 Generalized anxiety disorder: Secondary | ICD-10-CM

## 2013-01-18 DIAGNOSIS — D649 Anemia, unspecified: Secondary | ICD-10-CM

## 2013-01-18 MED ORDER — MAGNESIUM SULFATE 50 % IJ SOLN
1.0000 g | Freq: Once | INTRAMUSCULAR | Status: AC
  Administered 2013-01-18: 2 g via INTRAMUSCULAR

## 2013-01-18 NOTE — Telephone Encounter (Signed)
Med filled.  

## 2013-01-18 NOTE — Progress Notes (Signed)
Patient came in for lab work and magnesium injection per Dr. Darrick Huntsman.

## 2013-01-19 LAB — OSMOLALITY, URINE: Osmolality, Ur: 660 mOsm/kg (ref 390–1090)

## 2013-01-19 LAB — POTASSIUM, URINE, RANDOM: Potassium Urine: 80 mEq/L

## 2013-01-22 ENCOUNTER — Encounter: Payer: Self-pay | Admitting: *Deleted

## 2013-01-30 ENCOUNTER — Encounter: Payer: Self-pay | Admitting: Internal Medicine

## 2013-02-02 ENCOUNTER — Other Ambulatory Visit: Payer: Self-pay | Admitting: Internal Medicine

## 2013-02-06 ENCOUNTER — Other Ambulatory Visit: Payer: Self-pay | Admitting: Internal Medicine

## 2013-02-06 MED ORDER — OXYCODONE-ACETAMINOPHEN 5-325 MG PO TABS: 1.0000 | ORAL_TABLET | Freq: Four times a day (QID) | ORAL | Status: DC | PRN

## 2013-02-06 MED ORDER — OXYCODONE HCL ER 60 MG PO T12A: 1.0000 | EXTENDED_RELEASE_TABLET | Freq: Two times a day (BID) | ORAL | Status: DC | PRN

## 2013-02-06 NOTE — Telephone Encounter (Signed)
Notified patient scripts are ready but cannot be filled until 02/12/13

## 2013-02-06 NOTE — Telephone Encounter (Signed)
That is correct.  No early refills,  Printed out for refill on may 12

## 2013-02-06 NOTE — Telephone Encounter (Signed)
Pt sent MyChart refill requests for Oxycontin 60 mg and Oxycodone 5-325mg . Note says refill on or after 02/12/13.

## 2013-02-10 ENCOUNTER — Other Ambulatory Visit: Payer: Self-pay | Admitting: Internal Medicine

## 2013-02-19 ENCOUNTER — Other Ambulatory Visit: Payer: Self-pay | Admitting: *Deleted

## 2013-02-21 MED ORDER — ESCITALOPRAM OXALATE 10 MG PO TABS: 10.0000 mg | ORAL_TABLET | Freq: Every day | ORAL | Status: DC

## 2013-02-21 NOTE — Telephone Encounter (Signed)
Rx sent to pharmacy by escript  

## 2013-02-22 ENCOUNTER — Encounter: Payer: Self-pay | Admitting: Adult Health

## 2013-02-22 ENCOUNTER — Ambulatory Visit (INDEPENDENT_AMBULATORY_CARE_PROVIDER_SITE_OTHER): Payer: Medicare Other | Admitting: Adult Health

## 2013-02-22 VITALS — BP 100/60 | HR 69 | Temp 98.5°F | Resp 16 | Wt 165.0 lb

## 2013-02-22 DIAGNOSIS — I959 Hypotension, unspecified: Secondary | ICD-10-CM

## 2013-02-22 DIAGNOSIS — F17200 Nicotine dependence, unspecified, uncomplicated: Secondary | ICD-10-CM

## 2013-02-22 DIAGNOSIS — Z7189 Other specified counseling: Secondary | ICD-10-CM

## 2013-02-22 DIAGNOSIS — Z716 Tobacco abuse counseling: Secondary | ICD-10-CM

## 2013-02-22 DIAGNOSIS — R079 Chest pain, unspecified: Secondary | ICD-10-CM

## 2013-02-22 MED ORDER — NITROGLYCERIN 0.4 MG SL SUBL: 0.4000 mg | SUBLINGUAL_TABLET | SUBLINGUAL | Status: AC | PRN

## 2013-02-22 NOTE — Patient Instructions (Addendum)
  Take carvedilol 6.25 mg in the morning and 6.25 mg in the evening.  Hold lasix on Friday. Resume on Saturday.  Please check your blood pressure for the next few days to monitor for any improvement.

## 2013-02-22 NOTE — Progress Notes (Signed)
Subjective:    Patient ID: Patrick [name], male    DOB: [birth year], 72 y.o.   MRN: [id number]  HPI  Patient is a pleasant 72 y/o male with multiple medical problems including diabetes, ischemic cardiomyopathy, coronary artery disease, peripheral vascular disease, hypertension, hyperlipidemia, chronic kidney disease stage III, COPD, tobacco abuse who presents to clinic with low b/p for the past several days. Readings have been in the low 100s over low 60s. He has been slightly lightheaded mainly occuring with moving from sitting to standing position. Carvedilol 6.25 mg in the morning and 12.5 mg in the evening. He has taken lasix 50 mg yesterday for LE edema. His normal dose is 20 mg; however, he has been instructed to take an additional dose of lasix if he begins to experience LE edema. He is accompanied today by his daughter.   Past Medical History  Diagnosis Date  . Hypertension   . Hyperlipidemia   . Coronary artery disease     lbbb  . PAD (peripheral artery disease)   . Diabetes mellitus     poorly controlled  . Anxiety   . Tobacco abuse   . Peripheral vascular disease     with prior stents placed bilaterally  . MI (myocardial infarction) 2000  . Anemia   . COPD exacerbation   . Acute renal failure     due to ATN versus hypovolemia  . Acute exacerbation of CHF (congestive heart failure)     EF 25%,  . COPD (chronic obstructive pulmonary disease)      Current Outpatient Prescriptions on File Prior to Visit  Medication Sig Dispense Refill  . aspirin 81 MG tablet Take 81 mg by mouth daily.      . carvedilol (COREG) 12.5 MG tablet Take 1 tablet (12.5 mg total) by mouth 2 (two) times daily with a meal.  60 tablet  6  . escitalopram (LEXAPRO) 10 MG tablet Take 1 tablet (10 mg total) by mouth daily.  30 tablet  2  . esomeprazole (NEXIUM) 40 MG capsule TAKE 1 CAPSULE BY MOUTH EVERY DAY  30 capsule  3  . ferrous sulfate 325 (65 FE) MG EC tablet Take 325 mg by mouth daily with breakfast.         . furosemide (LASIX) 20 MG tablet TAKE 1 TABLET BY MOUTH TWICE DAILY  60 tablet  3  . gabapentin (NEURONTIN) 300 MG capsule TAKE 1 CAPSULE BY MOUTH EVERY DAY  30 capsule  0  . insulin lispro protamine-lispro (HUMALOG MIX 75/25) (75-25) 100 UNIT/ML SUSP Inject 25 Units into the skin 2 (two) times daily with a meal. Dx. 250.00  10 mL  12  . Insulin Syringe-Needle U-100 (INSULIN SYRINGE 1CC/30GX5/16") 30G X 5/16" 1 ML MISC USE FOUR TIMES DAILY  200 each  7  . ipratropium-albuterol (DUONEB) 0.5-2.5 (3) MG/3ML SOLN Take 3 mLs by nebulization every 4 (four) hours as needed.      . isosorbide mononitrate (IMDUR) 30 MG 24 hr tablet TAKE 1 TABLET BY MOUTH EVERY DAY  30 tablet  6  . mirtazapine (REMERON) 15 MG tablet Take 1.5 tablets (22.5 mg total) by mouth at bedtime.  45 tablet  5  . OxyCODONE HCl ER (OXYCONTIN) 60 MG T12A Take 1 tablet by mouth every 12 (twelve) hours as needed.  60 each  0  . oxyCODONE-acetaminophen (PERCOCET/ROXICET) 5-325 MG per tablet Take 1 tablet by mouth every 6 (six) hours as needed.  120 tablet  0  . PROAIR HFA  108 (90 BASE) MCG/ACT inhaler INHALE 2 PUFFS BY MOUTH EVERY 6 HOURS AS NEEDED FOR SHORTNESS OF BREATH OR WHEEZING  8.5 g  0  . simvastatin (ZOCOR) 40 MG tablet TAKE 1 TABLET BY MOUTH EVERY NIGHT AT BEDTIME FOR CHOLESTEROL  90 tablet  0  . SPIRIVA HANDIHALER 18 MCG inhalation capsule INHALE CONTENTS OF 1 CAPSULE VIA HANDIHALER EVERY DAY  30 capsule  0  . dimenhyDRINATE (DRAMAMINE) 50 MG tablet Take 50 mg by mouth every 8 (eight) hours as needed.        . magnesium oxide (MAG-OX 400) 400 MG tablet Take 1 tablet (400 mg total) by mouth 2 (two) times daily.  60 tablet  3  . meloxicam (MOBIC) 15 MG tablet Take 1 tablet (15 mg total) by mouth daily.  30 tablet  5  . methocarbamol (ROBAXIN) 500 MG tablet Take 1 tablet (500 mg total) by mouth 3 (three) times daily. As needed for back pain  30 tablet  3  . Omega-3 Fatty Acids (FISH OIL) 1000 MG CAPS Take by mouth. Take two  capsules daily.      . sodium polystyrene (KAYEXALATE) 15 GM/60ML suspension Take 60 mLs (15 g total) by mouth 2 (two) times daily. Until he has a loose stool, then once daily thereafter  500 mL  2  . [DISCONTINUED] metoprolol tartrate (LOPRESSOR) 25 MG tablet Take 1/2 tablet by mouth twice daily  30 tablet  6   No current facility-administered medications on file prior to visit.     Review of Systems  Constitutional: Negative for fever and chills.  Respiratory: Negative for cough, chest tightness and wheezing.        Shortness of breath with exertion; none at rest  Cardiovascular: Positive for leg swelling. Negative for chest pain.  Neurological: Positive for light-headedness. Negative for syncope.  Psychiatric/Behavioral: Negative.    BP 100/60  Pulse 69  Temp(Src) 98.5 F (36.9 C) (Oral)  Resp 16  Wt 165 lb (74.844 kg)  BMI 23.68 kg/m2  SpO2 97%  Objective:   Physical Exam  Constitutional: He is oriented to person, place, and time.  Frail appearing, elderly male in no apparent distress  HENT:  Head: Normocephalic and atraumatic.  Eyes: Conjunctivae and EOM are normal. Pupils are equal, round, and reactive to light.  Cardiovascular: Normal rate, regular rhythm and normal heart sounds.  Exam reveals no gallop.   No murmur heard. Pulmonary/Chest: Effort normal. No respiratory distress. He has wheezes. He exhibits no tenderness.  Few scattered wheezes posterior bilateral upper lobes.  Abdominal: Soft. Bowel sounds are normal.  Musculoskeletal: He exhibits no edema.  Neurological: He is alert and oriented to person, place, and time.  Skin: Skin is warm and dry.  Psychiatric: He has a normal mood and affect. His behavior is normal. Judgment and thought content normal.       Assessment & Plan:

## 2013-02-23 ENCOUNTER — Encounter: Payer: Self-pay | Admitting: Adult Health

## 2013-02-23 ENCOUNTER — Telehealth: Payer: Self-pay | Admitting: *Deleted

## 2013-02-23 ENCOUNTER — Telehealth: Payer: Self-pay | Admitting: Internal Medicine

## 2013-02-23 DIAGNOSIS — I959 Hypotension, unspecified: Secondary | ICD-10-CM | POA: Insufficient documentation

## 2013-02-23 LAB — BASIC METABOLIC PANEL
CO2: 29 mEq/L (ref 19–32)
Calcium: 8.9 mg/dL (ref 8.4–10.5)
Creatinine, Ser: 2.1 mg/dL — ABNORMAL HIGH (ref 0.4–1.5)
GFR: 33.36 mL/min — ABNORMAL LOW (ref >60.00)
Sodium: 135 mEq/L (ref 135–145)

## 2013-02-23 MED ORDER — CARVEDILOL 12.5 MG PO TABS: ORAL_TABLET | ORAL | Status: DC

## 2013-02-23 NOTE — Assessment & Plan Note (Addendum)
Suspect multifactorial etiology with medications and decreased oral fluid intake. Patient recently took 50 mg of Lasix for reports of lower extremity edema. He is currently on carvedilol 6.25 mg in the morning and 12.5 mg in the evening. I have instructed him to hold this evening's dose. Start carvedilol 6.2 5 in the morning and 6.2 5 in the evening. I have also asked him to hold Lasix Friday and resumed on Saturday. Check metabolic panel. Also checked orthostatic blood pressures. Diastolic dropped 20 points. I have asked patient to gently hydrate. Keep activities to a minimum for the next couple of days until symptoms subside. Report worsening symptoms. Note that greater than 25 minutes were spent in face-to-face contact with patient and family in the coordination, assessment and implementation of care.

## 2013-02-23 NOTE — Assessment & Plan Note (Signed)
Discussed importance of smoking cessation. Patient verbalizes understanding.

## 2013-02-23 NOTE — Telephone Encounter (Signed)
Called and left message on pt's home phone number. Called cell phone and it is his daughter's phone, but I spoke with her. She states his blood pressure was better last night, did not have an exact reading, but was higher than his hypotensive readings from the past few days. She states he is hard of hearing and may not have heard my call to the home phone. I asked her to check with him today to see how he was feeling and to give a call back if he was not improving or any new concerns.

## 2013-02-23 NOTE — Telephone Encounter (Signed)
Spoke with pt regarding his labs. States he had stopped taking his Kayexalate, advised to restart daily and advised on a low potassium diet. Verbalized understanding. Also informed pt to take his Lasix as directed and not to increase his dose. Pt states feeling better today. BPs this morning were 139/74 and 136/82. Advised to call back with persistence of symptoms.

## 2013-02-23 NOTE — Telephone Encounter (Signed)
Patrick [name] is supposed to be taking kayexelate daily and follow a low potassium diet.  His cr is up from the extra lasix he took so he needs to stop doing that.

## 2013-03-01 ENCOUNTER — Other Ambulatory Visit: Payer: Self-pay | Admitting: Internal Medicine

## 2013-03-02 NOTE — Telephone Encounter (Signed)
meloxicam refill denied,  He cannot take it, or aleve, or Motrin bc of his kidney disease. This was dc'd months ago

## 2013-03-04 ENCOUNTER — Emergency Department: Payer: Self-pay | Admitting: Emergency Medicine

## 2013-03-04 ENCOUNTER — Other Ambulatory Visit: Payer: Self-pay | Admitting: Internal Medicine

## 2013-03-05 ENCOUNTER — Ambulatory Visit: Payer: Self-pay | Admitting: Vascular Surgery

## 2013-03-05 ENCOUNTER — Other Ambulatory Visit: Payer: Self-pay | Admitting: Internal Medicine

## 2013-03-05 ENCOUNTER — Encounter: Payer: Self-pay | Admitting: Internal Medicine

## 2013-03-05 LAB — BASIC METABOLIC PANEL
Anion Gap: 3 — ABNORMAL LOW (ref 7–16)
BUN: 32 mg/dL — ABNORMAL HIGH (ref 7–18)
Calcium, Total: 9 mg/dL (ref 8.5–10.1)
Chloride: 105 mmol/L (ref 98–107)
EGFR (African American): 47 — ABNORMAL LOW
EGFR (Non-African Amer.): 40 — ABNORMAL LOW
Potassium: 4.2 mmol/L (ref 3.5–5.1)

## 2013-03-06 ENCOUNTER — Encounter: Payer: Self-pay | Admitting: Internal Medicine

## 2013-03-06 ENCOUNTER — Encounter: Payer: Self-pay | Admitting: Cardiothoracic Surgery

## 2013-03-06 ENCOUNTER — Encounter: Payer: Self-pay | Admitting: Nurse Practitioner

## 2013-03-06 ENCOUNTER — Telehealth: Payer: Self-pay

## 2013-03-06 DIAGNOSIS — I739 Peripheral vascular disease, unspecified: Secondary | ICD-10-CM

## 2013-03-06 NOTE — Telephone Encounter (Signed)
Patient already had medications sent to pharmacy

## 2013-03-07 ENCOUNTER — Other Ambulatory Visit: Payer: Self-pay | Admitting: Internal Medicine

## 2013-03-09 ENCOUNTER — Encounter: Payer: Self-pay | Admitting: Internal Medicine

## 2013-03-09 ENCOUNTER — Ambulatory Visit (INDEPENDENT_AMBULATORY_CARE_PROVIDER_SITE_OTHER): Payer: Medicare Other | Admitting: Internal Medicine

## 2013-03-09 VITALS — BP 118/46 | HR 76 | Temp 99.1°F | Resp 16 | Wt 171.0 lb

## 2013-03-09 DIAGNOSIS — Z7189 Other specified counseling: Secondary | ICD-10-CM

## 2013-03-09 DIAGNOSIS — Z716 Tobacco abuse counseling: Secondary | ICD-10-CM

## 2013-03-09 DIAGNOSIS — I739 Peripheral vascular disease, unspecified: Secondary | ICD-10-CM

## 2013-03-09 DIAGNOSIS — F17200 Nicotine dependence, unspecified, uncomplicated: Secondary | ICD-10-CM

## 2013-03-09 DIAGNOSIS — E1165 Type 2 diabetes mellitus with hyperglycemia: Secondary | ICD-10-CM

## 2013-03-09 DIAGNOSIS — E1159 Type 2 diabetes mellitus with other circulatory complications: Secondary | ICD-10-CM

## 2013-03-09 DIAGNOSIS — I959 Hypotension, unspecified: Secondary | ICD-10-CM

## 2013-03-09 DIAGNOSIS — E1059 Type 1 diabetes mellitus with other circulatory complications: Secondary | ICD-10-CM

## 2013-03-09 MED ORDER — VARENICLINE TARTRATE 0.5 MG X 11 & 1 MG X 42 PO MISC: ORAL | Status: DC

## 2013-03-09 MED ORDER — CARVEDILOL 6.25 MG PO TABS: 3.1250 mg | ORAL_TABLET | Freq: Two times a day (BID) | ORAL | Status: DC

## 2013-03-09 MED ORDER — OXYCODONE-ACETAMINOPHEN 5-325 MG PO TABS: 1.0000 | ORAL_TABLET | Freq: Four times a day (QID) | ORAL | Status: DC | PRN

## 2013-03-09 MED ORDER — VARENICLINE TARTRATE 1 MG PO TABS: 1.0000 mg | ORAL_TABLET | Freq: Two times a day (BID) | ORAL | Status: DC

## 2013-03-09 NOTE — Progress Notes (Signed)
Patient ID: Patrick [name], male   DOB: 1940/10/09, 72 y.o.   MRN: [id number]  Patient Active Problem List   Diagnosis Date Noted  . Hypotension, unspecified 02/23/2013  . COPD exacerbation 12/22/2012  . Bronchitis with chronic airway obstruction 10/07/2012  . Depression with anxiety 05/11/2012  . Hyperkalemia 04/16/2012  . Chronic kidney disease (CKD), stage III (moderate) 04/16/2012  . Tobacco abuse counseling 02/14/2012  . Anemia 12/14/2011  . Cardiomyopathy, ischemic 12/14/2011  . Type 2 diabetes, uncontrolled, with leg or foot ulcer   . Anxiety   . Peripheral vascular disease   . TOBACCO ABUSE 07/10/2010  . HYPERTENSION, BENIGN 07/10/2010  . CAD, AUTOLOGOUS BYPASS GRAFT 07/10/2010  . HYPERLIPIDEMIA-MIXED 07/09/2010  . EDEMA 07/09/2010    Subjective:  CC:   Chief Complaint  Patient presents with  . Follow-up    For hypotension    HPI:   Patrick [name] a 72 y.o. male who presents for 2 month follow up on uncontrolled diabetes with neuropathy, nephropathy, peripheral vascular disease with ischemic rest pain and left foot ulcer, chronic ongoing tobacco abuse, medication noncompliance and chronic back pain from spinal stenosis.  He underwent a lower extremity angiogram last week by Dr. Wyn Quaker, which failed to reestablish blood flow and is now risking loss of toe due t persistent ulceration.   He lives alone.  His daughter accompanies him today.  She is again requesting a referral to another podiatrist because she blames Dr. Geryl Rankins failure to act quickly enough to refer patient to vascular surgery .  I have reviewed the available notes and find that on April 22 he was offered angiogram by Dr. Wyn Quaker due to increased waveforms on ABI of left foot and 2 week history of left foot ulcer and patient deferred angiogram at that time.  Regarding his diabetes, he contineus to take his prescribed regimen of bid mixed insulin prn and post prandially  instead of as directed.  He has been suspending it for  normoglycemic pre meal cbgs.  He has omitted one dose of mixed insulin an average of once daily, either because he is intentionally omitting it for the reasons described, or because he forgets and falls asleep after eating , which is when he is taking it rather than prior to the meal as directed on numerous occasions.    He is still smoking, although he states that he has reduced his quantity but still smoking daily.    Past Medical History  Diagnosis Date  . Hypertension   . Hyperlipidemia   . Coronary artery disease     lbbb  . PAD (peripheral artery disease)   . Diabetes mellitus     poorly controlled  . Anxiety   . Tobacco abuse   . Peripheral vascular disease     with prior stents placed bilaterally  . MI (myocardial infarction) 2000  . Anemia   . COPD exacerbation   . Acute renal failure     due to ATN versus hypovolemia  . Acute exacerbation of CHF (congestive heart failure)     EF 25%,  . COPD (chronic obstructive pulmonary disease)     Past Surgical History  Procedure Laterality Date  . Coronary artery bypass graft      x4  . Stents in legs  03/2010    [facility]  . Cholecystectomy    . Cardiac catheterization  2010    [facility]  . Cataract extraction    . Cataract extraction, bilateral  2013  .  Iliac artery stent    . External iliac      stent, left     The following portions of the patient's history were reviewed and updated as appropriate: Allergies, current medications, and problem list.    Review of Systems:   Patient denies headache, fevers, malaise, unintentional weight loss, skin rash, eye pain, sinus congestion and sinus pain, sore throat, dysphagia,  hemoptysis , cough, dyspnea, wheezing, chest pain, palpitations, orthopnea, edema, abdominal pain, nausea, melena, diarrhea, constipation, flank pain, dysuria, hematuria, urinary  Frequency, nocturia, tingling, seizures,  Focal weakness, Loss of consciousness,  Tremor, insomnia,  and  suicidal ideation.        History   Social History  . Marital Status: Single    Spouse Name: N/A    Number of Children: N/A  . Years of Education: N/A   Occupational History  . Not on file.   Social History Main Topics  . Smoking status: Current Every Day Smoker -- 0.50 packs/day for 30 years    Types: Cigarettes  . Smokeless tobacco: Never Used     Comment: RECENTLY STOPPED  . Alcohol Use: No  . Drug Use: No  . Sexually Active: Not on file   Other Topics Concern  . Not on file   Social History Narrative  . No narrative on file    Objective:  BP 118/46  Pulse 76  Temp(Src) 99.1 F (37.3 C) (Oral)  Resp 16  Wt 171 lb (77.565 kg)  BMI 24.54 kg/m2  SpO2 96%  General appearance: alert, cooperative and appears stated age Ears: normal TM's and external ear canals both ears Throat: lips, mucosa, and tongue normal; teeth and gums normal Neck: no adenopathy, no carotid bruit, supple, symmetrical, trachea midline and thyroid not enlarged, symmetric, no tenderness/mass/nodules Back: symmetric, no curvature. ROM normal. No CVA tenderness. Lungs: clear to auscultation bilaterally Heart: regular rate and rhythm, S1, S2 normal, no murmur, click, rub or gallop Abdomen: soft, non-tender; bowel sounds normal; no masses,  no organomegaly Pulses: 2+ and symmetric Skin: Skin color, texture, turgor normal. No rashes or lesions Lymph nodes: Cervical, supraclavicular, and axillary nodes normal. Ext:  Not examined due to wound dressing.   Assessment and Plan:  TOBACCO ABUSE Addressed again today with daughter and patient.    Tobacco abuse counseling Spent 3 minutes discussing risk of continued tobacco abuse in the setting of known  CAD, PAD,  He is now interested in pharmacotherapy at this time because of his progressive PAD and risk of toe amputation . Chantix prescribed and recommended and samples fist two week pack along with  coupon for savings card  Given.   Peripheral  vascular disease Patient now has  Nonhealing left foot toe ulcer since early May and an unsuccessful attempt to restore blood flow t toe per op noted from [facility]. On appropriate meds but still smoking.  His risk for limb aputation,  Homero Fellers discussion with patiern and daughter about the effects of smoking on circulation. (again)  Hypotension, unspecified Improved with suspension of Imdur and has has had no chest pain.  Reduced carvedilol dose to 6 mg bid dose with reduction to 3 prn systolic < 100,  6 mg tablet prescribed   Type 2 diabetes, uncontrolled, with leg or foot ulcer Patient is very resistant to advice but resistant to educational opportunities.  He is homebound now because of his claudicaton and foot ulcer.  Home health RN aide requested by daughter, which I agree with .  I have written out a sliding scale for his mixed insulin based on he rpremeal cbg and his loosely  estimated carbohydrate intake for that meal.   Per patient request, he would like a new podiatrist.  Referral to [facility] offered.   A total of 40 minutes was spent with patient and daughter, more than half of which was spent in counseling, reviewing records from other providers and coordination of care.  Updated Medication List Outpatient Encounter Prescriptions as of 03/09/2013  Medication Sig Dispense Refill  . aspirin 81 MG tablet Take 81 mg by mouth daily.      . carvedilol (COREG) 6.25 MG tablet Take 0.5 tablets (3.125 mg total) by mouth 2 (two) times daily with a meal.  30 tablet  6  . dimenhyDRINATE (DRAMAMINE) 50 MG tablet Take 50 mg by mouth every 8 (eight) hours as needed.        Marland Kitchen escitalopram (LEXAPRO) 10 MG tablet Take 1 tablet (10 mg total) by mouth daily.  30 tablet  2  . esomeprazole (NEXIUM) 40 MG capsule TAKE 1 CAPSULE BY MOUTH EVERY DAY  30 capsule  3  . ferrous sulfate 325 (65 FE) MG EC tablet Take 325 mg by mouth daily with breakfast.        . furosemide (LASIX) 20 MG tablet TAKE 1 TABLET BY  MOUTH TWICE DAILY  60 tablet  3  . gabapentin (NEURONTIN) 300 MG capsule TAKE 1 CAPSULE BY MOUTH EVERY DAY  30 capsule  0  . insulin lispro protamine-lispro (HUMALOG MIX 75/25) (75-25) 100 UNIT/ML SUSP Inject 25 Units into the skin 2 (two) times daily with a meal. Dx. 250.00  10 mL  12  . Insulin Syringe-Needle U-100 (INSULIN SYRINGE 1CC/30GX5/16") 30G X 5/16" 1 ML MISC USE FOUR TIMES DAILY  200 each  7  . ipratropium-albuterol (DUONEB) 0.5-2.5 (3) MG/3ML SOLN Take 3 mLs by nebulization every 4 (four) hours as needed.      . meloxicam (MOBIC) 15 MG tablet Take 1 tablet (15 mg total) by mouth daily.  30 tablet  5  . mirtazapine (REMERON) 15 MG tablet Take 1.5 tablets (22.5 mg total) by mouth at bedtime.  45 tablet  5  . mirtazapine (REMERON) 15 MG tablet TAKE 1 TABLET BY MOUTH EVERY NIGHT AT BEDTIME  30 tablet  0  . OxyCODONE HCl ER (OXYCONTIN) 60 MG T12A Take 1 tablet by mouth every 12 (twelve) hours as needed.  60 each  0  . oxyCODONE-acetaminophen (PERCOCET/ROXICET) 5-325 MG per tablet Take 1 tablet by mouth every 6 (six) hours as needed.  120 tablet  0  . PROAIR HFA 108 (90 BASE) MCG/ACT inhaler INHALE 2 PUFFS BY MOUTH EVERY 6 HOURS AS NEEDED FOR SHORTNESS OF BREATH OR WHEEZING  8.5 g  0  . SPIRIVA HANDIHALER 18 MCG inhalation capsule INHALE CONTENTS OF 1 CAPSULE VIA HANDIHALER EVERY DAY  30 capsule  0  . [DISCONTINUED] carvedilol (COREG) 12.5 MG tablet Take one half tablet by mouth twice a day  60 tablet  6  . [DISCONTINUED] oxyCODONE-acetaminophen (PERCOCET/ROXICET) 5-325 MG per tablet Take 1 tablet by mouth every 6 (six) hours as needed.  120 tablet  0  . [DISCONTINUED] oxyCODONE-acetaminophen (PERCOCET/ROXICET) 5-325 MG per tablet Take 1 tablet by mouth every 6 (six) hours as needed.  120 tablet  0  . isosorbide mononitrate (IMDUR) 30 MG 24 hr tablet TAKE 1 TABLET BY MOUTH EVERY DAY  30 tablet  6  . magnesium oxide (  MAG-OX 400) 400 MG tablet Take 1 tablet (400 mg total) by mouth 2 (two) times  daily.  60 tablet  3  . methocarbamol (ROBAXIN) 500 MG tablet Take 1 tablet (500 mg total) by mouth 3 (three) times daily. As needed for back pain  30 tablet  3  . mupirocin ointment (BACTROBAN) 2 %       . nitroGLYCERIN (NITROSTAT) 0.4 MG SL tablet Place 1 tablet (0.4 mg total) under the tongue every 5 (five) minutes as needed.  30 tablet  6  . Omega-3 Fatty Acids (FISH OIL) 1000 MG CAPS Take by mouth. Take two capsules daily.      . simvastatin (ZOCOR) 40 MG tablet TAKE 1 TABLET BY MOUTH EVERY NIGHT AT BEDTIME FOR CHOLESTEROL  90 tablet  0  . sodium polystyrene (KAYEXALATE) 15 GM/60ML suspension Take 60 mLs (15 g total) by mouth 2 (two) times daily. Until he has a loose stool, then once daily thereafter  500 mL  2  . varenicline (CHANTIX CONTINUING MONTH PAK) 1 MG tablet Take 1 tablet (1 mg total) by mouth 2 (two) times daily.  60 tablet  2  . [DISCONTINUED] varenicline (CHANTIX STARTING MONTH PAK) 0.5 MG X 11 & 1 MG X 42 tablet Take one 0.5 mg tablet by mouth once daily for 3 days, then increase to one 0.5 mg tablet twice daily for 4 days, then increase to one 1 mg tablet twice daily.  53 tablet  0   No facility-administered encounter medications on file as of 03/09/2013.     Orders Placed This Encounter  Procedures  . DME Other see comment  . Ambulatory referral to Podiatry  . Ambulatory referral to Home Health    No Follow-up on file.

## 2013-03-09 NOTE — Patient Instructions (Addendum)
I am decreasing the carvedilol to 3. 125 mg twice daily using a 6 mg tablet   increase the fluid pill to 40 mg every other day,  alternate with 20 mg .  Can take 1 extra dose as needed for st gain of 2 lbs over night    If you drink the Atkins shake for breakfast use the following scale for your morning insulin   0 units for BS < 125  3 units for BS > 125 but < 200  5 units for BS < 200 but less than 250  Try this scale for a week:   For evening:  If your dinner is grilled or baked meat without bread,  And steamed vegetables:  5 units for bS < 125  10 units for BS > 125 and less than 200  15 units for BS > 200 and less than 250  20 units for BS > 250 and < 300   Add 5 units to each level if adding  a potato or a bread to the meal  Add another 5 units if having desert (icr cream,  Pie or ice cream)

## 2013-03-11 ENCOUNTER — Other Ambulatory Visit: Payer: Self-pay | Admitting: Cardiovascular Disease

## 2013-03-11 ENCOUNTER — Encounter: Payer: Self-pay | Admitting: Internal Medicine

## 2013-03-11 NOTE — Assessment & Plan Note (Signed)
Addressed again today with daughter and patient.

## 2013-03-11 NOTE — Assessment & Plan Note (Signed)
Spent 3 minutes discussing risk of continued tobacco abuse in the setting of known  CAD, PAD,  He is now interested in pharmacotherapy at this time because of his progressive PAD and risk of toe amputation . Chantix prescribed and recommended and samples fist two week pack along with  coupon for savings card  Given.

## 2013-03-11 NOTE — Assessment & Plan Note (Addendum)
Patient is very resistant to advice but resistant to educational opportunities.  He is homebound now because of his claudicaton and foot ulcer.  Home health RN aide requested by daughter, which I agree with .  I have written out a sliding scale for his mixed insulin based on he rpremeal cbg and his loosely  estimated carbohydrate intake for that meal.   Per patient request, he would like a new podiatrist.  Referral to [facility] offered.

## 2013-03-11 NOTE — Assessment & Plan Note (Signed)
Improved with suspension of Imdur and has has had no chest pain.  Reduced carvedilol dose to 6 mg bid dose with reduction to 3 prn systolic < 100,  6 mg tablet prescribed

## 2013-03-11 NOTE — Assessment & Plan Note (Addendum)
Patient now has  Nonhealing left foot toe ulcer since early May and an unsuccessful attempt to restore blood flow t toe per op noted from [facility]. On appropriate meds but still smoking.  His risk for limb aputation,  Homero Fellers discussion with patiern and daughter about the effects of smoking on circulation. (again)

## 2013-03-12 ENCOUNTER — Ambulatory Visit: Payer: Self-pay | Admitting: Vascular Surgery

## 2013-03-12 ENCOUNTER — Other Ambulatory Visit: Payer: Self-pay | Admitting: *Deleted

## 2013-03-12 LAB — BASIC METABOLIC PANEL
Anion Gap: 3 — ABNORMAL LOW (ref 7–16)
BUN: 34 mg/dL — ABNORMAL HIGH (ref 7–18)
Calcium, Total: 8.9 mg/dL (ref 8.5–10.1)
Chloride: 108 mmol/L — ABNORMAL HIGH (ref 98–107)
Co2: 31 mmol/L (ref 21–32)
Creatinine: 1.32 mg/dL — ABNORMAL HIGH (ref 0.60–1.30)
EGFR (African American): >60
EGFR (Non-African Amer.): 55 — ABNORMAL LOW
Osmolality: 291 (ref 275–301)

## 2013-03-12 MED ORDER — CARVEDILOL 6.25 MG PO TABS: 3.1250 mg | ORAL_TABLET | Freq: Two times a day (BID) | ORAL | Status: DC

## 2013-03-12 NOTE — Telephone Encounter (Signed)
Refilled Carvedilol sent to [facility] pharmacy. 

## 2013-03-15 ENCOUNTER — Other Ambulatory Visit: Payer: Self-pay | Admitting: Internal Medicine

## 2013-03-15 ENCOUNTER — Telehealth: Payer: Self-pay | Admitting: Internal Medicine

## 2013-03-15 MED ORDER — OXYCODONE HCL ER 60 MG PO T12A: 1.0000 | EXTENDED_RELEASE_TABLET | Freq: Two times a day (BID) | ORAL | Status: DC | PRN

## 2013-03-15 NOTE — Telephone Encounter (Signed)
The patient's daughter called stating he did not receive a prescription for OxyContin he did receive two prescriptions for oxycodone .

## 2013-03-16 NOTE — Telephone Encounter (Signed)
Patient picked up 6/12\14

## 2013-03-19 ENCOUNTER — Ambulatory Visit: Payer: Self-pay | Admitting: Vascular Surgery

## 2013-03-19 ENCOUNTER — Other Ambulatory Visit: Payer: Self-pay

## 2013-03-19 DIAGNOSIS — I739 Peripheral vascular disease, unspecified: Secondary | ICD-10-CM

## 2013-03-19 LAB — BASIC METABOLIC PANEL
BUN: 34 mg/dL — ABNORMAL HIGH (ref 7–18)
Calcium, Total: 8.4 mg/dL — ABNORMAL LOW (ref 8.5–10.1)
Chloride: 106 mmol/L (ref 98–107)
Creatinine: 1.43 mg/dL — ABNORMAL HIGH (ref 0.60–1.30)
EGFR (African American): 57 — ABNORMAL LOW
EGFR (Non-African Amer.): 49 — ABNORMAL LOW
Glucose: 221 mg/dL — ABNORMAL HIGH (ref 65–99)
Osmolality: 292 (ref 275–301)

## 2013-03-21 LAB — CBC WITH DIFFERENTIAL/PLATELET
Basophil #: 0.1 10*3/uL (ref 0.0–0.1)
Basophil %: 0.9 %
Lymphocyte #: 2 10*3/uL (ref 1.0–3.6)
Lymphocyte %: 21.2 %
MCH: 26.6 pg (ref 26.0–34.0)
MCHC: 32.2 g/dL (ref 32.0–36.0)
MCV: 83 fL (ref 80–100)
Monocyte %: 6.2 %
RBC: 3.6 10*6/uL — ABNORMAL LOW (ref 4.40–5.90)
RDW: 15.5 % — ABNORMAL HIGH (ref 11.5–14.5)

## 2013-03-22 ENCOUNTER — Telehealth: Payer: Self-pay

## 2013-03-22 ENCOUNTER — Other Ambulatory Visit: Payer: Self-pay | Admitting: Internal Medicine

## 2013-03-22 DIAGNOSIS — I739 Peripheral vascular disease, unspecified: Secondary | ICD-10-CM

## 2013-03-22 DIAGNOSIS — Z01818 Encounter for other preprocedural examination: Secondary | ICD-10-CM

## 2013-03-22 DIAGNOSIS — D649 Anemia, unspecified: Secondary | ICD-10-CM

## 2013-03-22 DIAGNOSIS — N183 Chronic kidney disease, stage 3 unspecified: Secondary | ICD-10-CM

## 2013-03-22 NOTE — Telephone Encounter (Signed)
Please advise 

## 2013-03-22 NOTE — Telephone Encounter (Signed)
Needs cardiac clearance for surgery at [facility]

## 2013-03-23 ENCOUNTER — Ambulatory Visit: Payer: Self-pay | Admitting: Podiatry

## 2013-03-23 NOTE — Telephone Encounter (Signed)
What is the surgery? Would need renal function rechecked by renal MD or PMD. Not sure if contrast can be used if creatinine still 2.1 If major surgery, AAA repair, would need lexiscan myoview

## 2013-03-23 NOTE — Telephone Encounter (Signed)
Pt will be having a left fem bypass, no labs have been done their with the office of Dr Barbara Cower Dew/ vasc surg. His surgical scheduler said they do not do blood work? msg left with pt to call back to schedule some labs for today or Monday.  Labs reviewed from prior,  bmet cbc pt/ptt were ordered.  Please advise if you would like anything else, pt was last seen 12/22/12.

## 2013-03-24 NOTE — Telephone Encounter (Signed)
Whether to proceed with surgery is up to patient. He is at least moderate if not high risk of complication given: Ef <25%, ischemic cardiomyopathy,  Poorly controlled diabetes, Renal failure,  Other PAD including carotid disease.  If he is having any chest pain symptoms, would proceed with lexiscan If no worsening SOB/chest pain, no further testing. He is not a good candidate for cardiac cath given renal failure

## 2013-03-26 ENCOUNTER — Other Ambulatory Visit: Payer: Self-pay | Admitting: *Deleted

## 2013-03-26 ENCOUNTER — Other Ambulatory Visit: Payer: Medicare Other

## 2013-03-26 NOTE — Telephone Encounter (Signed)
I spoke with pt's dtr who says pt is [facility] Says pt has not been c/o any CP/SOB but she will check with him and let us know should he tell her otherwise Says he was asked this question last week for a minor toe procedure and he declined CP/SOB at that time as well  I told her i would fax letter to Dr. Wyn Quaker stating he is at moderate-high risk for surg (per Dr. Windell Hummingbird note) and she should be getting some correspondence from [facility] verb

## 2013-03-26 NOTE — Telephone Encounter (Signed)
Letter faxed.

## 2013-03-27 ENCOUNTER — Ambulatory Visit (INDEPENDENT_AMBULATORY_CARE_PROVIDER_SITE_OTHER): Payer: Medicare Other

## 2013-03-27 ENCOUNTER — Other Ambulatory Visit: Payer: Self-pay | Admitting: *Deleted

## 2013-03-27 DIAGNOSIS — I25810 Atherosclerosis of coronary artery bypass graft(s) without angina pectoris: Secondary | ICD-10-CM

## 2013-03-27 DIAGNOSIS — Z5181 Encounter for therapeutic drug level monitoring: Secondary | ICD-10-CM

## 2013-03-27 DIAGNOSIS — R0989 Other specified symptoms and signs involving the circulatory and respiratory systems: Secondary | ICD-10-CM

## 2013-03-27 DIAGNOSIS — R05 Cough: Secondary | ICD-10-CM

## 2013-03-27 DIAGNOSIS — R059 Cough, unspecified: Secondary | ICD-10-CM

## 2013-03-27 DIAGNOSIS — Z79899 Other long term (current) drug therapy: Secondary | ICD-10-CM

## 2013-03-27 LAB — PATHOLOGY REPORT

## 2013-03-27 NOTE — Telephone Encounter (Signed)
Please advise as to refill protcol does not allow me to refill.

## 2013-03-27 NOTE — Addendum Note (Signed)
Addended by: Festus Aloe on: 03/27/2013 02:37 PM   Modules accepted: Orders

## 2013-03-28 LAB — CBC WITH DIFFERENTIAL/PLATELET
Basophils Absolute: 0.1 10*3/uL (ref 0.0–0.2)
Basos: 1 % (ref 0–3)
Eos: 3 % (ref 0–5)
Eosinophils Absolute: 0.3 10*3/uL (ref 0.0–0.4)
HCT: 27.6 % — ABNORMAL LOW (ref 37.5–51.0)
Hemoglobin: 9 g/dL — ABNORMAL LOW (ref 12.6–17.7)
Immature Grans (Abs): 0 10*3/uL (ref 0.0–0.1)
Immature Granulocytes: 0 % (ref 0–2)
Lymphocytes Absolute: 1.8 10*3/uL (ref 0.7–3.1)
Lymphs: 20 % (ref 14–46)
MCH: 27.1 pg (ref 26.6–33.0)
MCHC: 32.6 g/dL (ref 31.5–35.7)
MCV: 83 fL (ref 79–97)
Monocytes Absolute: 0.6 10*3/uL (ref 0.1–0.9)
Monocytes: 6 % (ref 4–12)
Neutrophils Absolute: 6.5 10*3/uL (ref 1.4–7.0)
Neutrophils Relative %: 70 % (ref 40–74)
RBC: 3.32 x10E6/uL — ABNORMAL LOW (ref 4.14–5.80)
RDW: 15.2 % (ref 12.3–15.4)
WBC: 9.3 10*3/uL (ref 3.4–10.8)

## 2013-03-28 LAB — PROTIME-INR: INR: 1.1 (ref 0.8–1.2)

## 2013-03-28 LAB — APTT: aPTT: 30 s (ref 24–33)

## 2013-03-28 LAB — BASIC METABOLIC PANEL
BUN/Creatinine Ratio: 19 (ref 10–22)
BUN: 29 mg/dL — ABNORMAL HIGH (ref 8–27)
CO2: 23 mmol/L (ref 18–29)
Calcium: 8.6 mg/dL (ref 8.6–10.2)
Creatinine, Ser: 1.5 mg/dL — ABNORMAL HIGH (ref 0.76–1.27)

## 2013-03-29 ENCOUNTER — Telehealth: Payer: Self-pay | Admitting: Internal Medicine

## 2013-03-29 DIAGNOSIS — F419 Anxiety disorder, unspecified: Secondary | ICD-10-CM

## 2013-03-29 MED ORDER — MIRTAZAPINE 15 MG PO TABS: ORAL_TABLET | ORAL | Status: DC

## 2013-03-29 NOTE — Telephone Encounter (Signed)
It was refilled on June 1st, per chart.,  Check with pharmacy  Ok to refill

## 2013-03-29 NOTE — Telephone Encounter (Signed)
Pt daughter called stating the she spoke with a nurse on Tuesday about a refill on mirtazapine (REMERON) 15 MG tablet. Spoke with pharmacy today, still does not have a refill. Please advise

## 2013-03-29 NOTE — Telephone Encounter (Signed)
Script sent and verified at pharmacy and patient notified.

## 2013-03-30 ENCOUNTER — Telehealth: Payer: Self-pay | Admitting: Internal Medicine

## 2013-03-30 DIAGNOSIS — I2510 Atherosclerotic heart disease of native coronary artery without angina pectoris: Secondary | ICD-10-CM

## 2013-03-30 DIAGNOSIS — I739 Peripheral vascular disease, unspecified: Secondary | ICD-10-CM

## 2013-03-30 DIAGNOSIS — J449 Chronic obstructive pulmonary disease, unspecified: Secondary | ICD-10-CM

## 2013-03-30 DIAGNOSIS — E1351 Other specified diabetes mellitus with diabetic peripheral angiopathy without gangrene: Secondary | ICD-10-CM

## 2013-03-30 NOTE — Telephone Encounter (Signed)
Referral is in process as requested 

## 2013-03-30 NOTE — Telephone Encounter (Signed)
Patrick [name] called from hospice she just received call from Patrick [name].  Patrick [name] called and wants hospice care

## 2013-04-01 ENCOUNTER — Other Ambulatory Visit: Payer: Self-pay | Admitting: Internal Medicine

## 2013-04-02 ENCOUNTER — Other Ambulatory Visit: Payer: Self-pay | Admitting: Internal Medicine

## 2013-04-02 NOTE — Telephone Encounter (Signed)
Patient notified referral in process. 

## 2013-04-02 NOTE — Telephone Encounter (Signed)
Pt requesting 90 day supply, Rx sent to pharmacy by escript

## 2013-04-03 ENCOUNTER — Encounter: Payer: Self-pay | Admitting: Nurse Practitioner

## 2013-04-03 ENCOUNTER — Encounter: Payer: Self-pay | Admitting: Cardiothoracic Surgery

## 2013-04-05 ENCOUNTER — Other Ambulatory Visit: Payer: Self-pay | Admitting: Internal Medicine

## 2013-04-09 ENCOUNTER — Telehealth: Payer: Self-pay | Admitting: Internal Medicine

## 2013-04-09 ENCOUNTER — Telehealth: Payer: Self-pay | Admitting: *Deleted

## 2013-04-09 MED ORDER — OXYCODONE-ACETAMINOPHEN 5-325 MG PO TABS: 1.0000 | ORAL_TABLET | Freq: Four times a day (QID) | ORAL | Status: DC | PRN

## 2013-04-09 MED ORDER — OXYCODONE HCL ER 60 MG PO T12A: 1.0000 | EXTENDED_RELEASE_TABLET | Freq: Two times a day (BID) | ORAL | Status: DC | PRN

## 2013-04-09 NOTE — Telephone Encounter (Signed)
Amil Amen, from hospice called to let you know they will not be admitting patient to hospice today due to patient in Hospital and from what she understood they amputated part of his foot and he will be admitted to rehab on discharge FYI.

## 2013-04-10 NOTE — Telephone Encounter (Signed)
Hospice called on 7/7/14and stated patient is currently in hospital and is to be transferred to rehab on discharge.

## 2013-04-12 ENCOUNTER — Telehealth: Payer: Self-pay | Admitting: Internal Medicine

## 2013-04-12 NOTE — Telephone Encounter (Signed)
Patient remains in hospital and plans are for discharge to rehab for one week on Monday, advised daughter to call back closer to patient coming home from rehab to fill scripts she stated she would advise on Monday. FYI

## 2013-04-12 NOTE — Telephone Encounter (Signed)
Oxycontin and oxycodone refills needed by tomorrow.  Ines Bloomer will be coming in to pick those, please contact Shawn when available.

## 2013-05-01 ENCOUNTER — Encounter: Payer: Self-pay | Admitting: Internal Medicine

## 2013-05-04 ENCOUNTER — Encounter: Payer: Self-pay | Admitting: Internal Medicine

## 2013-05-09 ENCOUNTER — Other Ambulatory Visit: Payer: Self-pay

## 2013-05-17 ENCOUNTER — Telehealth: Payer: Self-pay | Admitting: *Deleted

## 2013-05-17 NOTE — Telephone Encounter (Signed)
Patients on Hospice typically dont' get routine bloodwork. But he usually gets a CMET, HgBa1c and fasting lipids every 3 months

## 2013-05-17 NOTE — Telephone Encounter (Signed)
Patrick [name] patient care coordinator called and would like to know what labs you require for this patient in regards to kidney function she was advised you monitor , but needs frequency and labs. Please advise.

## 2013-05-18 NOTE — Telephone Encounter (Signed)
Marchelle Folks notified by detailed message on answering machine.

## 2013-05-19 ENCOUNTER — Other Ambulatory Visit: Payer: Self-pay | Admitting: Internal Medicine

## 2013-06-04 ENCOUNTER — Encounter: Payer: Self-pay | Admitting: Internal Medicine

## 2013-06-13 ENCOUNTER — Telehealth: Payer: Self-pay | Admitting: *Deleted

## 2013-06-13 NOTE — Telephone Encounter (Signed)
New policy.  Patient has to come by himself to pick up meds and see Chanel before I will prescrbie.

## 2013-06-13 NOTE — Telephone Encounter (Signed)
Daughter called and stated father released from care facility and needs pain medication refills OxyContin and percocet please advise as to refill.

## 2013-06-14 ENCOUNTER — Other Ambulatory Visit: Payer: Self-pay | Admitting: *Deleted

## 2013-06-14 MED ORDER — OXYCODONE HCL ER 60 MG PO T12A: 1.0000 | EXTENDED_RELEASE_TABLET | Freq: Two times a day (BID) | ORAL | Status: DC | PRN

## 2013-06-14 MED ORDER — OXYCODONE-ACETAMINOPHEN 5-325 MG PO TABS: 1.0000 | ORAL_TABLET | Freq: Four times a day (QID) | ORAL | Status: DC | PRN

## 2013-06-14 NOTE — Telephone Encounter (Signed)
Patient daughter became defensive and stated when did this come into effect, ask if scripts would be ready when he comes because they cannot make two trips, patient daughter stated she would arrive sometime today. FYI

## 2013-06-14 NOTE — Progress Notes (Signed)
Script printed per Dr. Darrick Huntsman

## 2013-06-18 ENCOUNTER — Encounter: Payer: Self-pay | Admitting: *Deleted

## 2013-06-19 ENCOUNTER — Ambulatory Visit (INDEPENDENT_AMBULATORY_CARE_PROVIDER_SITE_OTHER): Payer: Medicare Other | Admitting: Internal Medicine

## 2013-06-19 ENCOUNTER — Encounter: Payer: Self-pay | Admitting: Internal Medicine

## 2013-06-19 ENCOUNTER — Encounter: Payer: Self-pay | Admitting: *Deleted

## 2013-06-19 VITALS — BP 108/48 | HR 74 | Temp 97.6°F | Resp 14

## 2013-06-19 DIAGNOSIS — E1151 Type 2 diabetes mellitus with diabetic peripheral angiopathy without gangrene: Secondary | ICD-10-CM

## 2013-06-19 DIAGNOSIS — R0902 Hypoxemia: Secondary | ICD-10-CM

## 2013-06-19 DIAGNOSIS — D649 Anemia, unspecified: Secondary | ICD-10-CM

## 2013-06-19 DIAGNOSIS — I739 Peripheral vascular disease, unspecified: Secondary | ICD-10-CM

## 2013-06-19 DIAGNOSIS — I798 Other disorders of arteries, arterioles and capillaries in diseases classified elsewhere: Secondary | ICD-10-CM

## 2013-06-19 DIAGNOSIS — R6889 Other general symptoms and signs: Secondary | ICD-10-CM

## 2013-06-19 DIAGNOSIS — J439 Emphysema, unspecified: Secondary | ICD-10-CM

## 2013-06-19 DIAGNOSIS — Z899 Acquired absence of limb, unspecified: Secondary | ICD-10-CM

## 2013-06-19 DIAGNOSIS — N183 Chronic kidney disease, stage 3 unspecified: Secondary | ICD-10-CM

## 2013-06-19 DIAGNOSIS — E1129 Type 2 diabetes mellitus with other diabetic kidney complication: Secondary | ICD-10-CM

## 2013-06-19 DIAGNOSIS — E1059 Type 1 diabetes mellitus with other circulatory complications: Secondary | ICD-10-CM

## 2013-06-19 DIAGNOSIS — J438 Other emphysema: Secondary | ICD-10-CM

## 2013-06-19 DIAGNOSIS — E1122 Type 2 diabetes mellitus with diabetic chronic kidney disease: Secondary | ICD-10-CM

## 2013-06-19 DIAGNOSIS — E1159 Type 2 diabetes mellitus with other circulatory complications: Secondary | ICD-10-CM

## 2013-06-19 NOTE — Progress Notes (Signed)
Patient ID: Patrick [name], male   DOB: 1941-04-28, 72 y.o.   MRN: [id number]  Patient Active Problem List   Diagnosis Date Noted  . Hypotension, unspecified 02/23/2013  . COPD exacerbation 12/22/2012  . Bronchitis with chronic airway obstruction 10/07/2012  . Depression with anxiety 05/11/2012  . Hyperkalemia 04/16/2012  . DM type 2 causing CKD stage 3 04/16/2012  . Tobacco abuse counseling 02/14/2012  . Anemia 12/14/2011  . Cardiomyopathy, ischemic 12/14/2011  . DM (diabetes mellitus), type 2 with peripheral vascular complications   . Anxiety   . Peripheral vascular disease   . TOBACCO ABUSE 07/10/2010  . HYPERTENSION, BENIGN 07/10/2010  . CAD, AUTOLOGOUS BYPASS GRAFT 07/10/2010  . HYPERLIPIDEMIA-MIXED 07/09/2010  . EDEMA 07/09/2010    Subjective:  CC:   Chief Complaint  Patient presents with  . Follow-up    HPI:   Patrick [name] a 72 y.o. male who presents  Past Medical History  Diagnosis Date  . Hypertension   . Hyperlipidemia   . Coronary artery disease     lbbb  . PAD (peripheral artery disease)   . Diabetes mellitus     poorly controlled  . Anxiety   . Tobacco abuse   . Peripheral vascular disease     with prior stents placed bilaterally  . MI (myocardial infarction) 2000  . Anemia   . COPD exacerbation   . Acute renal failure     due to ATN versus hypovolemia  . Acute exacerbation of CHF (congestive heart failure)     EF 25%,  . COPD (chronic obstructive pulmonary disease)     Past Surgical History  Procedure Laterality Date  . Coronary artery bypass graft      x4  . Stents in legs  03/2010    [facility]  . Cholecystectomy    . Cardiac catheterization  2010    [place]  . Cataract extraction    . Cataract extraction, bilateral  2013  . Iliac artery stent    . External iliac      stent, left        The following portions of the patient's history were reviewed and updated as appropriate: Allergies, current medications, and  problem list.    Review of Systems:   12 Pt  review of systems was negative except those addressed in the HPI,     History   Social History  . Marital Status: Single    Spouse Name: N/A    Number of Children: N/A  . Years of Education: N/A   Occupational History  . Not on file.   Social History Main Topics  . Smoking status: Former Smoker -- 0.50 packs/day for 30 years    Types: Cigarettes    Quit date: 03/19/2013  . Smokeless tobacco: Never Used     Comment: RECENTLY STOPPED  . Alcohol Use: No  . Drug Use: No  . Sexual Activity: Not on file   Other Topics Concern  . Not on file   Social History Narrative  . No narrative on file    Objective:  Filed Vitals:   06/19/13 1533  BP: 108/48  Pulse: 74  Temp: 97.6 F (36.4 C)  Resp: 14     General appearance: alert, cooperative and appears stated age Ears: normal TM's and external ear canals both ears Throat: lips, mucosa, and tongue normal; teeth and gums normal Neck: no adenopathy, no carotid bruit, supple, symmetrical, trachea midline and thyroid not enlarged, symmetric, no  tenderness/mass/nodules Back: symmetric, no curvature. ROM normal. No CVA tenderness. Lungs: clear to auscultation bilaterally Heart: regular rate and rhythm, S1, S2 normal, no murmur, click, rub or gallop Abdomen: soft, non-tender; bowel sounds normal; no masses,  no organomegaly Pulses: 2+ and symmetric Skin: Skin color, texture, turgor normal. No rashes or lesions Lymph nodes: Cervical, supraclavicular, and axillary nodes normal. Foot exam:  Left BKA stump well healed.  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  Patient's daughter is very upset at Patrick [name] for failing to react sooner to the problem. I reminded patient's wife daughter that Patrick [name] has had chronic progressively worsening disease  and continued to smoke and is manage his diabetes and that amputation was inevitable. He is getting his prosthesis today. He will continue followup with vascular surgery at [facility] but deferred.  DM (diabetes mellitus), type 2 with peripheral vascular complications His current A1c is the best it has been in years, reflecting to 6 weeks he spent in rehabilitation where his compliance was ensured. I will recommend that he continue to use the insulin regimen that was is well using rehabilitation and allow me to adjust his insulin levels monthly. His stump is well-healed from his BKA.  DM type 2 causing CKD stage 3 Recent creatinine is 1.7 and he has critical hyperkalemia which has been mmanaged in the past with kayexelate.  We are rechecking it today and resuming Kayexalate. On past episodes he has been sent to the ER and repeat levels were lower so he was sent home after hours of waiting.Since he is not symptomatic we will treat with home Kayexalate.  A total of 40 minutes was spent with patient more than half of which was spent in counseling, reviewing records from other prviders and coordination of care.   Updated Medication List Outpatient Encounter Prescriptions as of 06/19/2013  Medication Sig Dispense Refill  . aspirin 81 MG tablet Take 81 mg by mouth daily.      . carvedilol (COREG) 6.25 MG tablet Take 12.5 mg by mouth 2 (two) times daily with a meal.      . dimenhyDRINATE (DRAMAMINE) 50 MG tablet Take 50 mg by mouth every 8 (eight) hours as needed.        . ferrous sulfate 325 (65 FE) MG EC tablet Take 325 mg by mouth daily with breakfast.        . furosemide (LASIX) 20 MG tablet TAKE 1 TABLET BY MOUTH TWICE DAILY  60 tablet  3  . furosemide (LASIX) 20 MG tablet TAKE 1 TABLET BY MOUTH EVERY OTHER DAY  45 tablet  0  . gabapentin (NEURONTIN) 300 MG capsule TAKE 1 CAPSULE BY MOUTH EVERY DAY  30 capsule  0  . Insulin Syringe-Needle U-100 (INSULIN SYRINGE 1CC/30GX5/16") 30G X 5/16" 1 ML MISC USE  FOUR TIMES DAILY  200 each  7  . ipratropium-albuterol (DUONEB) 0.5-2.5 (3) MG/3ML SOLN Take 3 mLs by nebulization every 4 (four) hours as needed.      Marland Kitchen LANTUS 100 UNIT/ML injection Inject 25 Units into the skin 2 (two) times daily.      . magnesium oxide (MAG-OX 400) 400 MG tablet Take 1 tablet (400 mg total) by mouth 2 (two) times daily.  60 tablet  3  . methocarbamol (ROBAXIN) 500 MG tablet Take 1 tablet (500 mg total) by mouth  3 (three) times daily. As needed for back pain  30 tablet  3  . mirtazapine (REMERON) 15 MG tablet TAKE 1 TABLET BY MOUTH EVERY NIGHT AT BEDTIME  30 tablet  0  . mirtazapine (REMERON) 15 MG tablet Take 2 tablets each night at bedtime  60 tablet  5  . NEXIUM 40 MG capsule TAKE 1 CAPSULE BY MOUTH EVERY DAY  90 capsule  0  . nitroGLYCERIN (NITROSTAT) 0.4 MG SL tablet Place 1 tablet (0.4 mg total) under the tongue every 5 (five) minutes as needed.  30 tablet  6  . OxyCODONE HCl ER (OXYCONTIN) 60 MG T12A Take 1 tablet by mouth every 12 (twelve) hours as needed.  60 each  0  . oxyCODONE-acetaminophen (PERCOCET/ROXICET) 5-325 MG per tablet Take 1 tablet by mouth every 6 (six) hours as needed.  120 tablet  0  . PROAIR HFA 108 (90 BASE) MCG/ACT inhaler INHALE 2 PUFFS BY MOUTH EVERY 6 HOURS AS NEEDED FOR SHORTNESS OF BREATH OR WHEEZING  8.5 g  0  . simvastatin (ZOCOR) 40 MG tablet TAKE 1 TABLET BY MOUTH EVERY NIGHT AT BEDTIME FOR CHOLESTEROL  90 tablet  1  . SPIRIVA HANDIHALER 18 MCG inhalation capsule INHALE CONTENTS OF 1 CAPSULE VIA HANDIHALER EVERY DAY  30 capsule  0  . [DISCONTINUED] carvedilol (COREG) 6.25 MG tablet Take 0.5 tablets (3.125 mg total) by mouth 2 (two) times daily with a meal.  30 tablet  6  . escitalopram (LEXAPRO) 10 MG tablet TAKE 1 TABLET BY MOUTH EVERY DAY  30 tablet  2  . isosorbide mononitrate (IMDUR) 30 MG 24 hr tablet TAKE 1 TABLET BY MOUTH EVERY DAY  30 tablet  6  . meloxicam (MOBIC) 15 MG tablet Take 1 tablet (15 mg total) by mouth daily.  30 tablet  5   . mupirocin ointment (BACTROBAN) 2 %       . Omega-3 Fatty Acids (FISH OIL) 1000 MG CAPS Take by mouth. Take two capsules daily.      . varenicline (CHANTIX CONTINUING MONTH PAK) 1 MG tablet Take 1 tablet (1 mg total) by mouth 2 (two) times daily.  60 tablet  2  . [DISCONTINUED] carvedilol (COREG) 6.25 MG tablet TAKE 1 TABLET BY MOUTH TWICE DAILY WITH A MEAL  180 tablet  0  . [DISCONTINUED] insulin lispro protamine-lispro (HUMALOG MIX 75/25) (75-25) 100 UNIT/ML SUSP Inject 25 Units into the skin 2 (two) times daily with a meal. Dx. 250.00  10 mL  12  . [DISCONTINUED] sodium polystyrene (KAYEXALATE) 15 GM/60ML suspension Take 60 mLs (15 g total) by mouth 2 (two) times daily. Until he has a loose stool, then once daily thereafter  500 mL  2   No facility-administered encounter medications on file as of 06/19/2013.     Orders Placed This Encounter  Procedures  . CBC with Differential  . Comprehensive metabolic panel  . Hemoglobin A1c  . Microalbumin / creatinine urine ratio  . CBC with Differential  . Pulse oximetry, overnight    Return in about 4 weeks (around 07/17/2013).

## 2013-06-19 NOTE — Patient Instructions (Addendum)
The wound on your knee should be cleaned with sterile saline solution and the dressing should be an absorbent gauze dressing,.  Changed daily  The Lantus dose will be adjusted based on your hgba1c today  Make sure you show the surgeon the stitch that is still in your right thigh

## 2013-06-19 NOTE — Addendum Note (Signed)
Addended by: Montine Circle D on: 06/19/2013 04:11 PM   Modules accepted: Orders

## 2013-06-20 ENCOUNTER — Ambulatory Visit: Payer: Medicare Other | Admitting: Cardiovascular Disease

## 2013-06-20 ENCOUNTER — Encounter: Payer: Self-pay | Admitting: Internal Medicine

## 2013-06-20 ENCOUNTER — Other Ambulatory Visit (INDEPENDENT_AMBULATORY_CARE_PROVIDER_SITE_OTHER): Payer: Medicare Other

## 2013-06-20 ENCOUNTER — Telehealth: Payer: Self-pay | Admitting: *Deleted

## 2013-06-20 ENCOUNTER — Other Ambulatory Visit: Payer: Self-pay | Admitting: Internal Medicine

## 2013-06-20 DIAGNOSIS — E875 Hyperkalemia: Secondary | ICD-10-CM

## 2013-06-20 DIAGNOSIS — E1159 Type 2 diabetes mellitus with other circulatory complications: Secondary | ICD-10-CM

## 2013-06-20 LAB — CBC WITH DIFFERENTIAL/PLATELET
Basophils Absolute: 0.1 10*3/uL (ref 0.0–0.1)
Eosinophils Absolute: 0.2 10*3/uL (ref 0.0–0.7)
Hemoglobin: 9.6 g/dL — ABNORMAL LOW (ref 13.0–17.0)
Lymphocytes Relative: 29.5 % (ref 12.0–46.0)
MCHC: 32.7 g/dL (ref 30.0–36.0)
Neutro Abs: 3.9 10*3/uL (ref 1.4–7.7)
Neutrophils Relative %: 60.5 % (ref 43.0–77.0)
Platelets: 247 10*3/uL (ref 150.0–400.0)
RDW: 17.8 % — ABNORMAL HIGH (ref 11.5–14.6)

## 2013-06-20 LAB — COMPREHENSIVE METABOLIC PANEL
Albumin: 3.5 g/dL (ref 3.5–5.2)
Albumin: 3.4 g/dL — ABNORMAL LOW (ref 3.5–5.2)
Alkaline Phosphatase: 82 U/L (ref 39–117)
Alkaline Phosphatase: 83 U/L (ref 39–117)
BUN: 48 mg/dL — ABNORMAL HIGH (ref 6–23)
CO2: 26 mEq/L (ref 19–32)
Calcium: 9.1 mg/dL (ref 8.4–10.5)
Chloride: 108 mEq/L (ref 96–112)
Chloride: 107 mEq/L (ref 96–112)
GFR: 44.02 mL/min — ABNORMAL LOW (ref >60.00)
Glucose, Bld: 216 mg/dL — ABNORMAL HIGH (ref 70–99)
Glucose, Bld: 227 mg/dL — ABNORMAL HIGH (ref 70–99)
Potassium: 6.5 mEq/L (ref 3.5–5.1)
Sodium: 136 mEq/L (ref 135–145)
Sodium: 137 mEq/L (ref 135–145)
Total Bilirubin: 0.5 mg/dL (ref 0.3–1.2)
Total Protein: 6.9 g/dL (ref 6.0–8.3)
Total Protein: 7 g/dL (ref 6.0–8.3)

## 2013-06-20 LAB — HEMOGLOBIN A1C: Hgb A1c MFr Bld: 7.3 % — ABNORMAL HIGH (ref 4.6–6.5)

## 2013-06-20 MED ORDER — SODIUM POLYSTYRENE SULFONATE 15 GM/60ML PO SUSP: 15.0000 g | Freq: Two times a day (BID) | ORAL | Status: DC

## 2013-06-20 NOTE — Assessment & Plan Note (Addendum)
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  Patient's daughter is very upset at Dr. Wyn Quaker for failing to react sooner to the problem. I reminded patient's wife daughter that Patrick [name] has had chronic progressively worsening disease and continued to smoke and is manage his diabetes and that amputation was inevitable. He is getting his prosthesis today. He will continue followup with vascular surgery at [facility] but deferred.

## 2013-06-20 NOTE — Assessment & Plan Note (Signed)
Recent creatinine is 1.7 and he has critical hyperkalemia which has been mmanaged in the past with kayexelate.  We are rechecking it today and resuming Kayexalate. On past episodes he has been sent to the ER and repeat levels were lower so he was sent home after hours of waiting.Since he is not symptomatic we will treat with home Kayexalate.

## 2013-06-20 NOTE — Telephone Encounter (Signed)
[facility] called with critical lab for patient , was advised by Dr. Darrick Huntsman to have patient come here for stat potassium. Patient notified and is on his way here for redraw.

## 2013-06-20 NOTE — Assessment & Plan Note (Signed)
His current A1c is the best it has been in years, reflecting to 6 weeks he spent in rehabilitation where his compliance was ensured. I will recommend that he continue to use the insulin regimen that was is well using rehabilitation and allow me to adjust his insulin levels monthly. His stump is well-healed from his BKA.

## 2013-06-21 ENCOUNTER — Other Ambulatory Visit: Payer: Self-pay | Admitting: Cardiovascular Disease

## 2013-06-21 NOTE — Telephone Encounter (Signed)
Refilled Carvedilol sent to [facility] pharmacy.

## 2013-07-05 ENCOUNTER — Encounter: Payer: Self-pay | Admitting: Internal Medicine

## 2013-07-06 DIAGNOSIS — E1165 Type 2 diabetes mellitus with hyperglycemia: Secondary | ICD-10-CM

## 2013-07-06 DIAGNOSIS — T8189XA Other complications of procedures, not elsewhere classified, initial encounter: Secondary | ICD-10-CM

## 2013-07-06 DIAGNOSIS — E1159 Type 2 diabetes mellitus with other circulatory complications: Secondary | ICD-10-CM

## 2013-07-06 DIAGNOSIS — M9080 Osteopathy in diseases classified elsewhere, unspecified site: Secondary | ICD-10-CM

## 2013-07-06 DIAGNOSIS — E1169 Type 2 diabetes mellitus with other specified complication: Secondary | ICD-10-CM

## 2013-07-09 ENCOUNTER — Encounter: Payer: Self-pay | Admitting: Internal Medicine

## 2013-07-09 ENCOUNTER — Telehealth: Payer: Self-pay | Admitting: *Deleted

## 2013-07-09 NOTE — Telephone Encounter (Signed)
Patient has C/O blood in stool made appointment for acute visit with you on 07/10/13 . Patient daughter stated patient saw light amount of blood in stool after loose bowel movement.

## 2013-07-10 ENCOUNTER — Emergency Department: Payer: Self-pay | Admitting: Emergency Medicine

## 2013-07-10 ENCOUNTER — Telehealth: Payer: Self-pay | Admitting: *Deleted

## 2013-07-10 ENCOUNTER — Encounter: Payer: Self-pay | Admitting: Internal Medicine

## 2013-07-10 ENCOUNTER — Ambulatory Visit (INDEPENDENT_AMBULATORY_CARE_PROVIDER_SITE_OTHER): Payer: Medicare Other | Admitting: Internal Medicine

## 2013-07-10 VITALS — BP 112/50 | HR 69 | Temp 98.2°F | Wt 165.0 lb

## 2013-07-10 DIAGNOSIS — Z23 Encounter for immunization: Secondary | ICD-10-CM

## 2013-07-10 DIAGNOSIS — E875 Hyperkalemia: Secondary | ICD-10-CM

## 2013-07-10 DIAGNOSIS — D62 Acute posthemorrhagic anemia: Secondary | ICD-10-CM

## 2013-07-10 DIAGNOSIS — I2589 Other forms of chronic ischemic heart disease: Secondary | ICD-10-CM

## 2013-07-10 DIAGNOSIS — K921 Melena: Secondary | ICD-10-CM | POA: Insufficient documentation

## 2013-07-10 DIAGNOSIS — K2970 Gastritis, unspecified, without bleeding: Secondary | ICD-10-CM | POA: Insufficient documentation

## 2013-07-10 DIAGNOSIS — I255 Ischemic cardiomyopathy: Secondary | ICD-10-CM

## 2013-07-10 DIAGNOSIS — I739 Peripheral vascular disease, unspecified: Secondary | ICD-10-CM

## 2013-07-10 DIAGNOSIS — D649 Anemia, unspecified: Secondary | ICD-10-CM

## 2013-07-10 LAB — CBC WITH DIFFERENTIAL/PLATELET
Basophils Absolute: 0 10*3/uL (ref 0.0–0.1)
Eosinophils Absolute: 0.2 10*3/uL (ref 0.0–0.7)
Eosinophils Relative: 2.7 % (ref 0.0–5.0)
Hemoglobin: 6.4 g/dL — CL (ref 13.0–17.0)
Lymphocytes Relative: 27.1 % (ref 12.0–46.0)
Monocytes Relative: 6.9 % (ref 3.0–12.0)
Neutro Abs: 4.5 10*3/uL (ref 1.4–7.7)
Platelets: 288 10*3/uL (ref 150.0–400.0)
RDW: 16.3 % — ABNORMAL HIGH (ref 11.5–14.6)
WBC: 7.2 10*3/uL (ref 4.5–10.5)

## 2013-07-10 LAB — COMPREHENSIVE METABOLIC PANEL
ALT: 8 U/L (ref 0–53)
AST: 11 U/L (ref 0–37)
Alkaline Phosphatase: 80 U/L (ref 50–136)
Anion Gap: 4 — ABNORMAL LOW (ref 7–16)
BUN: 36 mg/dL — ABNORMAL HIGH (ref 6–23)
Bilirubin,Total: 0.1 mg/dL — ABNORMAL LOW (ref 0.2–1.0)
Calcium, Total: 8.3 mg/dL — ABNORMAL LOW (ref 8.5–10.1)
Calcium: 8.6 mg/dL (ref 8.4–10.5)
Chloride: 114 mEq/L — ABNORMAL HIGH (ref 96–112)
Creatinine, Ser: 1.5 mg/dL (ref 0.4–1.5)
EGFR (Non-African Amer.): 40 — ABNORMAL LOW
GFR: 50.29 mL/min — ABNORMAL LOW (ref >60.00)
Glucose: 263 mg/dL — ABNORMAL HIGH (ref 65–99)
Potassium: 4.8 mEq/L (ref 3.5–5.1)
Potassium: 4.3 mmol/L (ref 3.5–5.1)
SGOT(AST): 12 U/L — ABNORMAL LOW (ref 15–37)
SGPT (ALT): 11 U/L — ABNORMAL LOW (ref 12–78)
Sodium: 143 mmol/L (ref 136–145)
Total Bilirubin: 0.3 mg/dL (ref 0.3–1.2)
Total Protein: 6.5 g/dL (ref 6.4–8.2)

## 2013-07-10 LAB — CBC
HCT: 20.1 % — ABNORMAL LOW (ref 40.0–52.0)
MCH: 30.1 pg (ref 26.0–34.0)
Platelet: 288 10*3/uL (ref 150–440)
RBC: 2.18 10*6/uL — ABNORMAL LOW (ref 4.40–5.90)
RDW: 16.5 % — ABNORMAL HIGH (ref 11.5–14.5)

## 2013-07-10 LAB — LIPASE, BLOOD: Lipase: 95 U/L (ref 73–393)

## 2013-07-10 LAB — APTT: Activated PTT: 33.2 secs (ref 23.6–35.9)

## 2013-07-10 LAB — PROTIME-INR
INR: 1.2
Prothrombin Time: 15.1 secs — ABNORMAL HIGH (ref 11.5–14.7)

## 2013-07-10 LAB — FERRITIN: Ferritin: 21.9 ng/mL — ABNORMAL LOW (ref 22.0–322.0)

## 2013-07-10 MED ORDER — ZOSTER VACCINE LIVE 19400 UNT/0.65ML ~~LOC~~ SOLR: 0.6500 mL | Freq: Once | SUBCUTANEOUS | Status: DC

## 2013-07-10 MED ORDER — ALBUTEROL SULFATE HFA 108 (90 BASE) MCG/ACT IN AERS: INHALATION_SPRAY | RESPIRATORY_TRACT | Status: DC

## 2013-07-10 MED ORDER — ESOMEPRAZOLE MAGNESIUM 40 MG PO CPDR: 40.0000 mg | DELAYED_RELEASE_CAPSULE | Freq: Two times a day (BID) | ORAL | Status: AC

## 2013-07-10 MED ORDER — OXYCODONE-ACETAMINOPHEN 5-325 MG PO TABS: 1.0000 | ORAL_TABLET | Freq: Four times a day (QID) | ORAL | Status: DC | PRN

## 2013-07-10 MED ORDER — OXYCODONE HCL ER 60 MG PO T12A: 1.0000 | EXTENDED_RELEASE_TABLET | Freq: Two times a day (BID) | ORAL | Status: DC | PRN

## 2013-07-10 MED ORDER — TETANUS-DIPHTH-ACELL PERTUSSIS 5-2.5-18.5 LF-MCG/0.5 IM SUSP: 0.5000 mL | Freq: Once | INTRAMUSCULAR | Status: DC

## 2013-07-10 NOTE — Assessment & Plan Note (Addendum)
Recurrent,  With prior colonoscopy done 2011 at [facility].  Records unavailable.  Referral to GI for EGD and colonsocopy,  As an outpatient was in process but will take place in house given his significant drop in hgb to 6.4 .

## 2013-07-10 NOTE — Telephone Encounter (Signed)
Patrick [name] needs to go to the ER for admission ,  His hgb is critically low.

## 2013-07-10 NOTE — Assessment & Plan Note (Signed)
Recurrent secodnary to CKD.  On low potassium diet and prn Kayexelate.  May need daily dosing .repeat BMET pending

## 2013-07-10 NOTE — Telephone Encounter (Signed)
[facility] called with critical Hgb of 6.4 on patient verbally informed MD.

## 2013-07-10 NOTE — Telephone Encounter (Signed)
Dr. Darrick Huntsman stated will discuss at visit.

## 2013-07-10 NOTE — Progress Notes (Signed)
Patient ID: Patrick [name], male   DOB: 09/08/1941, 72 y.o.   MRN: [id number]  Patient Active Problem List   Diagnosis Date Noted  . Hematochezia 07/10/2013  . Hypotension, unspecified 02/23/2013  . COPD exacerbation 12/22/2012  . Bronchitis with chronic airway obstruction 10/07/2012  . Depression with anxiety 05/11/2012  . Hyperkalemia 04/16/2012  . DM type 2 causing CKD stage 3 04/16/2012  . Tobacco abuse counseling 02/14/2012  . Anemia 12/14/2011  . Cardiomyopathy, ischemic 12/14/2011  . DM (diabetes mellitus), type 2 with peripheral vascular complications   . Anxiety   . Peripheral vascular disease   . TOBACCO ABUSE 07/10/2010  . HYPERTENSION, BENIGN 07/10/2010  . CAD, AUTOLOGOUS BYPASS GRAFT 07/10/2010  . HYPERLIPIDEMIA-MIXED 07/09/2010  . EDEMA 07/09/2010    Subjective:  CC:   Chief Complaint  Patient presents with  . Blood in stool    Noticed some bright red blood in his stool on Thursday and Friday, has not seen anymore since that day. When he gets up in the morning he is sick on his stomach. Does not take iron pills and stool is black and noticed some blood on the tissue when he wiped.     HPI:   Patrick [name] a 72 y.o. male who presents with several recent self limited episodes  of bright red blood per rectum.  Has a history of chronic constipation,.aggravated by regular use of  Narcotics for spinal stenosis and PAD managed with nightly dulcolax and water.  Denies any recent need to strain.  Has not seen any bleed mixed with stool or on toilet paper since Friday.  His last colonoscopy was done in May 2011 during a hospitalization  in [place] because of syncope and anemia.  Per patient several polyps were found but not removed.    2) Gastritis.  For the last 2 weeks he has been waking up with nausea.  usng dramamine for relief of nausea.   The symptoms improve transiently with eating .  He takes an aspirin daily for severe PAD with recent BKA  Due to critical stenosis  causing pain and osteomyelitis.  He has been prescribed NSAIds in the past but states that he has not been taking any regularly except the aspirin.    Past Medical History  Diagnosis Date  . Hypertension   . Hyperlipidemia   . Coronary artery disease     lbbb  . PAD (peripheral artery disease)   . Diabetes mellitus     poorly controlled  . Anxiety   . Tobacco abuse   . Peripheral vascular disease     with prior stents placed bilaterally  . MI (myocardial infarction) 2000  . Anemia   . COPD exacerbation   . Acute renal failure     due to ATN versus hypovolemia  . Acute exacerbation of CHF (congestive heart failure)     EF 25%,  . COPD (chronic obstructive pulmonary disease)     Past Surgical History  Procedure Laterality Date  . Coronary artery bypass graft      x4  . Stents in legs  03/2010    [facility]  . Cholecystectomy    . Cardiac catheterization  2010    [place] [facility]  . Cataract extraction    . Cataract extraction, bilateral  2013  . Iliac artery stent    . External iliac      stent, left        The following portions of the patient's  history were reviewed and updated as appropriate: Allergies, current medications, and problem list.    Review of Systems:   12 Pt  review of systems was negative except those addressed in the HPI,     History   Social History  . Marital Status: Single    Spouse Name: N/A    Number of Children: N/A  . Years of Education: N/A   Occupational History  . Not on file.   Social History Main Topics  . Smoking status: Former Smoker -- 0.50 packs/day for 30 years    Types: Cigarettes    Quit date: 03/19/2013  . Smokeless tobacco: Never Used     Comment: RECENTLY STOPPED  . Alcohol Use: No  . Drug Use: No  . Sexual Activity: Not on file   Other Topics Concern  . Not on file   Social History Narrative  . No narrative on file    Objective:  Filed Vitals:   07/10/13 1012  BP: 112/50  Pulse:  69  Temp: 98.2 F (36.8 C)     General appearance: alert, cooperative and appears stated age Ears: normal TM's and external ear canals both ears Throat: lips, mucosa, and tongue normal; teeth and gums normal Neck: no adenopathy, no carotid bruit, supple, symmetrical, trachea midline and thyroid not enlarged, symmetric, no tenderness/mass/nodules Back: symmetric, no curvature. ROM normal. No CVA tenderness. Lungs: clear to auscultation bilaterally Heart: regular rate and rhythm, S1, S2 normal, no murmur, click, rub or gallop Abdomen: soft, tender without guarding  In mid epigastric area. bowel sounds normal; no masses,  no organomegaly Pulses: 2+ and symmetric Skin: Skin color, texture, turgor normal. No rashes or lesions Lymph nodes: Cervical, supraclavicular, and axillary nodes normal.  Assessment and Plan:  Hematochezia Recurrent,  With prior colonoscopy done 2011 at [facility].  Records unavailable.  Referral to GI for EGD and colonsocopy,  I did not discontinue his anti platelet agaents sinc ehe is not actively bleeding and has had recent vascular interventions for severe PAD including amputation of RLE/BKA  Anemia Multifactorial including iron deficiency and CKD.  Checking iron stores and renal functon today   Cardiomyopathy, ischemic Taking meds and abstaining from cigarettes. His most recent EF was 45% by [facility] during admission in May 2013 for chest pain .   Gastritis gien his severe PAD and recent stenting I have not stopped his antiplatelet agen, but have increased his nexium to 40 mg bid and stopped his NSAIDs.  Referral to [facility]   Hyperkalemia Recurrent secodnary to CKD.  On low potassium diet and prn Kayexelate.  May need daily dosing .repeat BMET pending   A total of 40 minutes was spent with patient more than half of which was spent in counseling, reviewing records from other prviders and coordination of care.   Updated Medication List Outpatient Encounter  Prescriptions as of 07/10/2013  Medication Sig Dispense Refill  . albuterol (PROAIR HFA) 108 (90 BASE) MCG/ACT inhaler INHALE 2 PUFFS BY MOUTH EVERY 6 HOURS AS NEEDED FOR SHORTNESS OF BREATH OR WHEEZING  8.5 g  11  . aspirin 81 MG tablet Take 81 mg by mouth daily.      . carvedilol (COREG) 6.25 MG tablet TAKE 1 TABLET BY MOUTH TWICE DAILY WITH A MEAL  180 tablet  3  . dimenhyDRINATE (DRAMAMINE) 50 MG tablet Take 50 mg by mouth every 8 (eight) hours as needed.        . DULoxetine (CYMBALTA) 60 MG capsule       .  escitalopram (LEXAPRO) 10 MG tablet TAKE 1 TABLET BY MOUTH EVERY DAY  30 tablet  2  . esomeprazole (NEXIUM) 40 MG capsule Take 1 capsule (40 mg total) by mouth 2 (two) times daily.  180 capsule  0  . felodipine (PLENDIL) 2.5 MG 24 hr tablet       . ferrous sulfate 325 (65 FE) MG EC tablet Take 325 mg by mouth daily with breakfast.        . furosemide (LASIX) 20 MG tablet TAKE 1 TABLET BY MOUTH EVERY OTHER DAY  45 tablet  0  . gabapentin (NEURONTIN) 300 MG capsule TAKE 1 CAPSULE BY MOUTH EVERY DAY  30 capsule  0  . Insulin Syringe-Needle U-100 (INSULIN SYRINGE 1CC/30GX5/16") 30G X 5/16" 1 ML MISC USE FOUR TIMES DAILY  200 each  7  . ipratropium-albuterol (DUONEB) 0.5-2.5 (3) MG/3ML SOLN Take 3 mLs by nebulization every 4 (four) hours as needed.      . isosorbide mononitrate (IMDUR) 30 MG 24 hr tablet TAKE 1 TABLET BY MOUTH EVERY DAY  30 tablet  6  . LANTUS 100 UNIT/ML injection Inject 25 Units into the skin 2 (two) times daily.      . magnesium oxide (MAG-OX 400) 400 MG tablet Take 1 tablet (400 mg total) by mouth 2 (two) times daily.  60 tablet  3  . methocarbamol (ROBAXIN) 500 MG tablet Take 1 tablet (500 mg total) by mouth 3 (three) times daily. As needed for back pain  30 tablet  3  . mirtazapine (REMERON) 15 MG tablet Take 2 tablets each night at bedtime  60 tablet  5  . mupirocin ointment (BACTROBAN) 2 %       . nitroGLYCERIN (NITROSTAT) 0.4 MG SL tablet Place 1 tablet (0.4 mg total)  under the tongue every 5 (five) minutes as needed.  30 tablet  6  . Omega-3 Fatty Acids (FISH OIL) 1000 MG CAPS Take by mouth. Take two capsules daily.      . OxyCODONE HCl ER (OXYCONTIN) 60 MG T12A Take 1 tablet by mouth every 12 (twelve) hours as needed. For severe pain  60 each  0  . oxyCODONE-acetaminophen (PERCOCET/ROXICET) 5-325 MG per tablet Take 1 tablet by mouth every 6 (six) hours as needed.  120 tablet  0  . simvastatin (ZOCOR) 40 MG tablet TAKE 1 TABLET BY MOUTH EVERY NIGHT AT BEDTIME FOR CHOLESTEROL  90 tablet  1  . sodium polystyrene (KAYEXALATE) 15 GM/60ML suspension Take 60 mLs (15 g total) by mouth 2 (two) times daily. Until he has a loose stool, then once daily thereafter  500 mL  2  . SPIRIVA HANDIHALER 18 MCG inhalation capsule INHALE CONTENTS OF 1 CAPSULE VIA HANDIHALER EVERY DAY  30 capsule  0  . traZODone (DESYREL) 50 MG tablet       . [DISCONTINUED] meloxicam (MOBIC) 15 MG tablet Take 1 tablet (15 mg total) by mouth daily.  30 tablet  5  . [DISCONTINUED] NEXIUM 40 MG capsule TAKE 1 CAPSULE BY MOUTH EVERY DAY  90 capsule  0  . [DISCONTINUED] OxyCODONE HCl ER (OXYCONTIN) 60 MG T12A Take 1 tablet by mouth every 12 (twelve) hours as needed.  60 each  0  . [DISCONTINUED] oxyCODONE-acetaminophen (PERCOCET/ROXICET) 5-325 MG per tablet Take 1 tablet by mouth every 6 (six) hours as needed.  120 tablet  0  . [DISCONTINUED] PROAIR HFA 108 (90 BASE) MCG/ACT inhaler INHALE 2 PUFFS BY MOUTH EVERY 6 HOURS AS NEEDED FOR SHORTNESS  OF BREATH OR WHEEZING  8.5 g  0  . TDaP (BOOSTRIX) 5-2.5-18.5 LF-MCG/0.5 injection Inject 0.5 mLs into the muscle once.  0.5 mL  0  . varenicline (CHANTIX CONTINUING MONTH PAK) 1 MG tablet Take 1 tablet (1 mg total) by mouth 2 (two) times daily.  60 tablet  2  . zoster vaccine live, PF, (ZOSTAVAX) 95621 UNT/0.65ML injection Inject 19,400 Units into the skin once.  1 each  0  . [DISCONTINUED] carvedilol (COREG) 6.25 MG tablet Take 12.5 mg by mouth 2 (two) times daily  with a meal.      . [DISCONTINUED] furosemide (LASIX) 20 MG tablet TAKE 1 TABLET BY MOUTH TWICE DAILY  60 tablet  3  . [DISCONTINUED] mirtazapine (REMERON) 15 MG tablet TAKE 1 TABLET BY MOUTH EVERY NIGHT AT BEDTIME  30 tablet  0   No facility-administered encounter medications on file as of 07/10/2013.

## 2013-07-10 NOTE — Assessment & Plan Note (Addendum)
Taking meds and abstaining from cigarettes. His most recent EF was 45% by [facility] during admission in May 2013 for chest pain .

## 2013-07-10 NOTE — Patient Instructions (Addendum)
Increase the nexium to two times daily  Referral to Dr Bluford Kaufmann in process for GI evaluation  Stop the meloxicam and any BC or Goody's powders  Continue the baby aspirin  Hold the nifedipine if BP is < 110/70  You received the pneumonia and influenza vaccines today  Your still need your tetanus-diptheria-pertussis vaccine (TDaP)and your Shingles vaccines but you can get it for less $$$ at a local pharmacy with the script I have provided you.

## 2013-07-10 NOTE — Assessment & Plan Note (Signed)
gien his severe PAD and recent stenting I have not stopped his antiplatelet agen, but have increased his nexium to 40 mg bid and stopped his NSAIDs.  Referral to [facility]

## 2013-07-10 NOTE — Telephone Encounter (Signed)
He will need referral to GI  Does he have a preference who I refer to?

## 2013-07-10 NOTE — Assessment & Plan Note (Addendum)
Severe,  hgb 6.4 secondary to GI losses  Resulting in iron deficiency and complicated by ischemic heart disease and CKD. Patient was contacted and advised to go to the [facility] ER for admission .  GI workup will take place in house after he is transfused.

## 2013-07-10 NOTE — Telephone Encounter (Signed)
Patient notified to go to ER for Hgb 6.4

## 2013-07-11 LAB — IRON AND TIBC
%SAT: 7 % — ABNORMAL LOW (ref 20–55)
Iron: 18 ug/dL — ABNORMAL LOW (ref 42–165)
UIBC: 224 ug/dL (ref 125–400)

## 2013-07-11 LAB — CBC
HCT: 25.2 % — ABNORMAL LOW (ref 40.0–52.0)
HGB: 8.3 g/dL — ABNORMAL LOW (ref 13.0–18.0)
MCH: 30.6 pg (ref 26.0–34.0)
MCHC: 32.8 g/dL (ref 32.0–36.0)
MCV: 93 fL (ref 80–100)
RBC: 2.7 10*6/uL — ABNORMAL LOW (ref 4.40–5.90)
WBC: 7.5 10*3/uL (ref 3.8–10.6)

## 2013-07-11 NOTE — Progress Notes (Addendum)
Patient ID: Patrick [name], male   DOB: 1940-12-23, 72 y.o.   MRN: [id number]  Patient Active Problem List   Diagnosis Date Noted  . Hematochezia 07/10/2013  . Gastritis 07/10/2013  . Hypotension, unspecified 02/23/2013  . COPD exacerbation 12/22/2012  . Bronchitis with chronic airway obstruction 10/07/2012  . Depression with anxiety 05/11/2012  . Hyperkalemia 04/16/2012  . DM type 2 causing CKD stage 3 04/16/2012  . Tobacco abuse counseling 02/14/2012  . Anemia 12/14/2011  . Cardiomyopathy, ischemic 12/14/2011  . DM (diabetes mellitus), type 2 with peripheral vascular complications   . Anxiety   . Peripheral vascular disease   . TOBACCO ABUSE 07/10/2010  . HYPERTENSION, BENIGN 07/10/2010  . CAD, AUTOLOGOUS BYPASS GRAFT 07/10/2010  . HYPERLIPIDEMIA-MIXED 07/09/2010  . EDEMA 07/09/2010    Subjective:  CC:   Chief Complaint  Patient presents with  . Blood in stool    Noticed some bright red blood in his stool on Thursday and Friday, has not seen anymore since that day. When he gets up in the morning he is sick on his stomach. Does not take iron pills and stool is black and noticed some blood on the tissue when he wiped.     HPI:   [name] a 72 y.o. male who presents with Recent self limited episodes  of bright red blood per rectum.  Has a history of chronic constipation,.aggravated by regular use of  Narcotics for spinal stenosis and PAD managed with nightly dulcolax and increased water intake.   Denies any recent need to strain.  Has not seen any blood mixed with stool or on toilet paper since Friday.  His last colonoscopy was done in May 2011 during a hospitalization  in [place] because of syncope and anemia.  Per patient, several polyps were found but not removed.  Records were not available.   2) Gastritis.  For the last 2 weeks he has been waking up with nausea.  usng dramamine for relief of nausea.   The symptoms improve transiently with eating .  He takes an aspirin  daily for severe PAD with recent BKA  Due to critical stenosis causing pain and osteomyelitis.  He has been prescribed NSAIds in the past but states that he has not been taking any regularly except the aspirin.    Past Medical History  Diagnosis Date  . Hypertension   . Hyperlipidemia   . Coronary artery disease     lbbb  . PAD (peripheral artery disease)   . Diabetes mellitus     poorly controlled  . Anxiety   . Tobacco abuse   . Peripheral vascular disease     with prior stents placed bilaterally  . MI (myocardial infarction) 2000  . Anemia   . COPD exacerbation   . Acute renal failure     due to ATN versus hypovolemia  . Acute exacerbation of CHF (congestive heart failure)     EF 25%,  . COPD (chronic obstructive pulmonary disease)     Past Surgical History  Procedure Laterality Date  . Coronary artery bypass graft      x4  . Stents in legs  03/2010    [facility] [facility]  . Cholecystectomy    . Cardiac catheterization  2010    [place]  . Cataract extraction    . Cataract extraction, bilateral  2013  . Iliac artery stent    . External iliac      stent, left  The following portions of the patient's history were reviewed and updated as appropriate: Allergies, current medications, and problem list.    Review of Systems:   12 Pt  review of systems was negative except those addressed in the HPI,     History   Social History  . Marital Status: Single    Spouse Name: N/A    Number of Children: N/A  . Years of Education: N/A   Occupational History  . Not on file.   Social History Main Topics  . Smoking status: Former Smoker -- 0.50 packs/day for 30 years    Types: Cigarettes    Quit date: 03/19/2013  . Smokeless tobacco: Never Used     Comment: RECENTLY STOPPED  . Alcohol Use: No  . Drug Use: No  . Sexual Activity: Not on file   Other Topics Concern  . Not on file   Social History Narrative  . No narrative on file     Objective:  Filed Vitals:   07/10/13 1012  BP: 112/50  Pulse: 69  Temp: 98.2 F (36.8 C)     General appearance: alert, cooperative and appears stated age Ears: normal TM's and external ear canals both ears Throat: lips, mucosa, and tongue normal; teeth and gums normal Neck: no adenopathy, no carotid bruit, supple, symmetrical, trachea midline and thyroid not enlarged, symmetric, no tenderness/mass/nodules Back: symmetric, no curvature. ROM normal. No CVA tenderness. Lungs: clear to auscultation bilaterally Heart: regular rate and rhythm, S1, S2 normal, no murmur, click, rub or gallop Abdomen: soft, tender in mid epigastric area without guarding  bowel sounds normal; no masses,  no organomegaly Pulses: 2+ and symmetric Skin: Skin color, texture, turgor normal. No rashes or lesions.  Left BKA  Lymph nodes: Cervical, supraclavicular, and axillary nodes normal.  Assessment and Plan:  Hematochezia Recurrent,  With prior colonoscopy done 2011 at [facility].  Records unavailable.  Referral to GI for EGD and colonsocopy,  As an outpatient was in process but will take place in house given his significant drop in hgb to 6.4 .  Anemia Severe,  hgb 6.4 secondary to GI losses  Resulting in iron deficiency and complicated by ischemic heart disease and CKD. Patient was contacted and advised to go to the [facility] ER for admission .  GI workup will take place in house after he is transfused.   Cardiomyopathy, ischemic Taking meds and abstaining from cigarettes. His most recent EF was 45% by [facility] during admission in May 2013 for chest pain .   Gastritis gien his severe PAD and recent stenting I have not stopped his antiplatelet agen, but have increased his nexium to 40 mg bid and stopped his NSAIDs.  Referral to [facility]   Hyperkalemia Recurrent secodnary to CKD.  On low potassium diet and prn Kayexelate.  May need daily dosing .repeat BMET pending   Peripheral vascular disease S/p Left BKA  done at [facility] August 2014. Prosthesis made.  Cholesterol is under control but his history of medicatio nand lifestyle noncompliance with regard to tobacco abuse and uncontrolled DM were the major contributors.    Updated Medication List Outpatient Encounter Prescriptions as of 07/10/2013  Medication Sig Dispense Refill  . albuterol (PROAIR HFA) 108 (90 BASE) MCG/ACT inhaler INHALE 2 PUFFS BY MOUTH EVERY 6 HOURS AS NEEDED FOR SHORTNESS OF BREATH OR WHEEZING  8.5 g  11  . aspirin 81 MG tablet Take 81 mg by mouth daily.      . carvedilol (COREG) 6.25 MG  tablet TAKE 1 TABLET BY MOUTH TWICE DAILY WITH A MEAL  180 tablet  3  . dimenhyDRINATE (DRAMAMINE) 50 MG tablet Take 50 mg by mouth every 8 (eight) hours as needed.        . DULoxetine (CYMBALTA) 60 MG capsule       . escitalopram (LEXAPRO) 10 MG tablet TAKE 1 TABLET BY MOUTH EVERY DAY  30 tablet  2  . esomeprazole (NEXIUM) 40 MG capsule Take 1 capsule (40 mg total) by mouth 2 (two) times daily.  180 capsule  0  . felodipine (PLENDIL) 2.5 MG 24 hr tablet       . ferrous sulfate 325 (65 FE) MG EC tablet Take 325 mg by mouth daily with breakfast.        . furosemide (LASIX) 20 MG tablet TAKE 1 TABLET BY MOUTH EVERY OTHER DAY  45 tablet  0  . gabapentin (NEURONTIN) 300 MG capsule TAKE 1 CAPSULE BY MOUTH EVERY DAY  30 capsule  0  . Insulin Syringe-Needle U-100 (INSULIN SYRINGE 1CC/30GX5/16") 30G X 5/16" 1 ML MISC USE FOUR TIMES DAILY  200 each  7  . ipratropium-albuterol (DUONEB) 0.5-2.5 (3) MG/3ML SOLN Take 3 mLs by nebulization every 4 (four) hours as needed.      . isosorbide mononitrate (IMDUR) 30 MG 24 hr tablet TAKE 1 TABLET BY MOUTH EVERY DAY  30 tablet  6  . LANTUS 100 UNIT/ML injection Inject 25 Units into the skin 2 (two) times daily.      . magnesium oxide (MAG-OX 400) 400 MG tablet Take 1 tablet (400 mg total) by mouth 2 (two) times daily.  60 tablet  3  . methocarbamol (ROBAXIN) 500 MG tablet Take 1 tablet (500 mg total) by mouth 3 (three)  times daily. As needed for back pain  30 tablet  3  . mirtazapine (REMERON) 15 MG tablet Take 2 tablets each night at bedtime  60 tablet  5  . mupirocin ointment (BACTROBAN) 2 %       . nitroGLYCERIN (NITROSTAT) 0.4 MG SL tablet Place 1 tablet (0.4 mg total) under the tongue every 5 (five) minutes as needed.  30 tablet  6  . Omega-3 Fatty Acids (FISH OIL) 1000 MG CAPS Take by mouth. Take two capsules daily.      . OxyCODONE HCl ER (OXYCONTIN) 60 MG T12A Take 1 tablet by mouth every 12 (twelve) hours as needed. For severe pain  60 each  0  . oxyCODONE-acetaminophen (PERCOCET/ROXICET) 5-325 MG per tablet Take 1 tablet by mouth every 6 (six) hours as needed.  120 tablet  0  . simvastatin (ZOCOR) 40 MG tablet TAKE 1 TABLET BY MOUTH EVERY NIGHT AT BEDTIME FOR CHOLESTEROL  90 tablet  1  . sodium polystyrene (KAYEXALATE) 15 GM/60ML suspension Take 60 mLs (15 g total) by mouth 2 (two) times daily. Until he has a loose stool, then once daily thereafter  500 mL  2  . SPIRIVA HANDIHALER 18 MCG inhalation capsule INHALE CONTENTS OF 1 CAPSULE VIA HANDIHALER EVERY DAY  30 capsule  0  . traZODone (DESYREL) 50 MG tablet       . [DISCONTINUED] meloxicam (MOBIC) 15 MG tablet Take 1 tablet (15 mg total) by mouth daily.  30 tablet  5  . [DISCONTINUED] NEXIUM 40 MG capsule TAKE 1 CAPSULE BY MOUTH EVERY DAY  90 capsule  0  . [DISCONTINUED] OxyCODONE HCl ER (OXYCONTIN) 60 MG T12A Take 1 tablet by mouth every 12 (twelve) hours  as needed.  60 each  0  . [DISCONTINUED] oxyCODONE-acetaminophen (PERCOCET/ROXICET) 5-325 MG per tablet Take 1 tablet by mouth every 6 (six) hours as needed.  120 tablet  0  . [DISCONTINUED] PROAIR HFA 108 (90 BASE) MCG/ACT inhaler INHALE 2 PUFFS BY MOUTH EVERY 6 HOURS AS NEEDED FOR SHORTNESS OF BREATH OR WHEEZING  8.5 g  0  . TDaP (BOOSTRIX) 5-2.5-18.5 LF-MCG/0.5 injection Inject 0.5 mLs into the muscle once.  0.5 mL  0  . varenicline (CHANTIX CONTINUING MONTH PAK) 1 MG tablet Take 1 tablet (1 mg  total) by mouth 2 (two) times daily.  60 tablet  2  . zoster vaccine live, PF, (ZOSTAVAX) 96045 UNT/0.65ML injection Inject 19,400 Units into the skin once.  1 each  0  . [DISCONTINUED] carvedilol (COREG) 6.25 MG tablet Take 12.5 mg by mouth 2 (two) times daily with a meal.      . [DISCONTINUED] furosemide (LASIX) 20 MG tablet TAKE 1 TABLET BY MOUTH TWICE DAILY  60 tablet  3  . [DISCONTINUED] mirtazapine (REMERON) 15 MG tablet TAKE 1 TABLET BY MOUTH EVERY NIGHT AT BEDTIME  30 tablet  0   No facility-administered encounter medications on file as of 07/10/2013.     Orders Placed This Encounter  Procedures  . Fecal occult blood, imunochemical  . Pneumococcal polysaccharide vaccine 23-valent greater than or equal to 2yo subcutaneous/IM  . Flu Vaccine QUAD 36+ mos PF IM (Fluarix)  . Iron and TIBC  . Ferritin  . CBC with Differential  . Comprehensive metabolic panel  . Ambulatory referral to Gastroenterology    No Follow-up on file.

## 2013-07-11 NOTE — Assessment & Plan Note (Addendum)
S/p Left BKA done at [facility] August 2014. Prosthesis made.  Cholesterol is under control but his history of medicatio nand lifestyle noncompliance with regard to tobacco abuse and uncontrolled DM were the major contributors.

## 2013-07-12 ENCOUNTER — Encounter: Payer: Self-pay | Admitting: Internal Medicine

## 2013-07-13 ENCOUNTER — Ambulatory Visit: Payer: Self-pay | Admitting: Gastroenterology

## 2013-07-15 ENCOUNTER — Other Ambulatory Visit: Payer: Self-pay | Admitting: Internal Medicine

## 2013-07-16 ENCOUNTER — Other Ambulatory Visit: Payer: Self-pay | Admitting: Internal Medicine

## 2013-07-17 ENCOUNTER — Other Ambulatory Visit: Payer: Self-pay | Admitting: *Deleted

## 2013-07-17 ENCOUNTER — Other Ambulatory Visit: Payer: Self-pay | Admitting: Internal Medicine

## 2013-07-17 MED ORDER — TRAZODONE HCL 50 MG PO TABS: 50.0000 mg | ORAL_TABLET | Freq: Every day | ORAL | Status: DC

## 2013-07-17 MED ORDER — FELODIPINE ER 2.5 MG PO TB24: 2.5000 mg | ORAL_TABLET | Freq: Every day | ORAL | Status: DC

## 2013-07-17 NOTE — Telephone Encounter (Signed)
Okay to refill? Rx were previously filled by Dr. Dareen Piano per pt. Earleen Reaper message

## 2013-07-17 NOTE — Telephone Encounter (Signed)
Both refilled and sent in

## 2013-07-17 NOTE — Progress Notes (Signed)
This encounter was created in error - please disregard.

## 2013-08-03 ENCOUNTER — Telehealth: Payer: Self-pay | Admitting: *Deleted

## 2013-08-03 DIAGNOSIS — D62 Acute posthemorrhagic anemia: Secondary | ICD-10-CM

## 2013-08-03 NOTE — Telephone Encounter (Signed)
Pt is coming in for lab 11.03.2014 what labs and dx?  

## 2013-08-04 ENCOUNTER — Other Ambulatory Visit: Payer: Self-pay | Admitting: Internal Medicine

## 2013-08-06 ENCOUNTER — Encounter: Payer: Self-pay | Admitting: Internal Medicine

## 2013-08-06 ENCOUNTER — Other Ambulatory Visit (INDEPENDENT_AMBULATORY_CARE_PROVIDER_SITE_OTHER): Payer: Medicare Other

## 2013-08-06 ENCOUNTER — Other Ambulatory Visit: Payer: Self-pay | Admitting: *Deleted

## 2013-08-06 DIAGNOSIS — D62 Acute posthemorrhagic anemia: Secondary | ICD-10-CM

## 2013-08-07 LAB — CBC WITH DIFFERENTIAL/PLATELET
Basophils Relative: 0.7 % (ref 0.0–3.0)
Eosinophils Relative: 4.5 % (ref 0.0–5.0)
Lymphocytes Relative: 27.8 % (ref 12.0–46.0)
Neutrophils Relative %: 61.2 % (ref 43.0–77.0)
RBC: 3.67 Mil/uL — ABNORMAL LOW (ref 4.22–5.81)
WBC: 7.3 10*3/uL (ref 4.5–10.5)

## 2013-08-07 LAB — COMPREHENSIVE METABOLIC PANEL
AST: 9 U/L (ref 0–37)
Albumin: 2.8 g/dL — ABNORMAL LOW (ref 3.5–5.2)
BUN: 15 mg/dL (ref 6–23)
Calcium: 8 mg/dL — ABNORMAL LOW (ref 8.4–10.5)
Chloride: 102 mEq/L (ref 96–112)
Glucose, Bld: 224 mg/dL — ABNORMAL HIGH (ref 70–99)
Potassium: 3.4 mEq/L — ABNORMAL LOW (ref 3.5–5.1)
Sodium: 141 mEq/L (ref 135–145)
Total Protein: 6.5 g/dL (ref 6.0–8.3)

## 2013-08-08 ENCOUNTER — Encounter: Payer: Self-pay | Admitting: Internal Medicine

## 2013-08-09 ENCOUNTER — Telehealth: Payer: Self-pay | Admitting: Internal Medicine

## 2013-08-09 ENCOUNTER — Other Ambulatory Visit: Payer: Self-pay | Admitting: Internal Medicine

## 2013-08-09 MED ORDER — OXYCODONE-ACETAMINOPHEN 5-325 MG PO TABS: 1.0000 | ORAL_TABLET | Freq: Four times a day (QID) | ORAL | Status: DC | PRN

## 2013-08-09 MED ORDER — OXYCODONE HCL ER 60 MG PO T12A: 1.0000 | EXTENDED_RELEASE_TABLET | Freq: Two times a day (BID) | ORAL | Status: DC | PRN

## 2013-08-09 NOTE — Telephone Encounter (Signed)
Last visit 07/10/13, ok to refill?

## 2013-08-09 NOTE — Telephone Encounter (Signed)
Yes but they cannot be refilled until Nov 12th .  Rx's printed

## 2013-08-09 NOTE — Telephone Encounter (Signed)
Left vm.  States pt was in on Monday for blood work and requested refills at that time.  Calling to see if rx for oxycodone and oxycontin are ready to be picked up.

## 2013-08-10 NOTE — Telephone Encounter (Signed)
Notified Rxs ready for pickup

## 2013-08-10 NOTE — Telephone Encounter (Signed)
Left message for pt to return my call.

## 2013-08-13 ENCOUNTER — Other Ambulatory Visit: Payer: Self-pay | Admitting: Internal Medicine

## 2013-08-14 ENCOUNTER — Encounter: Payer: Self-pay | Admitting: Internal Medicine

## 2013-08-14 NOTE — Telephone Encounter (Signed)
Eprescribed.

## 2013-09-06 ENCOUNTER — Ambulatory Visit: Payer: Medicare Other | Admitting: Adult Health

## 2013-09-06 ENCOUNTER — Other Ambulatory Visit: Payer: Self-pay | Admitting: Internal Medicine

## 2013-09-06 NOTE — Telephone Encounter (Signed)
Last office visit 07/10/13

## 2013-09-07 ENCOUNTER — Ambulatory Visit: Payer: Medicare Other | Admitting: Cardiovascular Disease

## 2013-09-07 MED ORDER — OXYCODONE HCL ER 60 MG PO T12A: 1.0000 | EXTENDED_RELEASE_TABLET | Freq: Two times a day (BID) | ORAL | Status: DC | PRN

## 2013-09-07 MED ORDER — OXYCODONE-ACETAMINOPHEN 5-325 MG PO TABS: 1.0000 | ORAL_TABLET | Freq: Four times a day (QID) | ORAL | Status: DC | PRN

## 2013-09-07 NOTE — Telephone Encounter (Signed)
Patient notified and notified of refill date and pick up time

## 2013-09-10 ENCOUNTER — Ambulatory Visit: Payer: Medicare Other | Admitting: Adult Health

## 2013-09-16 MED ORDER — OXYCODONE HCL ER 60 MG PO T12A: 1.0000 | EXTENDED_RELEASE_TABLET | Freq: Two times a day (BID) | ORAL | Status: DC | PRN

## 2013-09-16 MED ORDER — OXYCODONE-ACETAMINOPHEN 5-325 MG PO TABS: 1.0000 | ORAL_TABLET | Freq: Four times a day (QID) | ORAL | Status: DC | PRN

## 2013-09-28 ENCOUNTER — Ambulatory Visit: Payer: Medicare Other | Admitting: Internal Medicine

## 2013-10-01 ENCOUNTER — Ambulatory Visit: Payer: Medicare Other | Admitting: Internal Medicine

## 2013-10-02 ENCOUNTER — Encounter: Payer: Self-pay | Admitting: Adult Health

## 2013-10-02 ENCOUNTER — Ambulatory Visit (INDEPENDENT_AMBULATORY_CARE_PROVIDER_SITE_OTHER): Payer: Medicare Other | Admitting: Adult Health

## 2013-10-02 VITALS — BP 142/66 | HR 80 | Temp 97.7°F | Resp 14 | Wt 174.0 lb

## 2013-10-02 DIAGNOSIS — Z79899 Other long term (current) drug therapy: Secondary | ICD-10-CM

## 2013-10-02 DIAGNOSIS — R609 Edema, unspecified: Secondary | ICD-10-CM

## 2013-10-02 DIAGNOSIS — R062 Wheezing: Secondary | ICD-10-CM

## 2013-10-02 DIAGNOSIS — R600 Localized edema: Secondary | ICD-10-CM | POA: Insufficient documentation

## 2013-10-02 MED ORDER — AZITHROMYCIN 250 MG PO TABS: ORAL_TABLET | ORAL | Status: DC

## 2013-10-02 NOTE — Patient Instructions (Signed)
  Use the ProAir inhaler 3 times a day for the next 3 days.  I am starting you on Azithromycin - take 2 tablets today then take 1 tablet daily for the next 4 days.  Make sure you get out of the wheelchair at least 3 times a day and elevate your right leg.  Return to clinic for follow up on Friday.

## 2013-10-02 NOTE — Progress Notes (Signed)
   Subjective:    Patient ID: Patrick [name], male    DOB: [birth year], 72 y.o.   MRN: [id number]  HPI  Right LE edema 1+ Pt has been sitting in wheelchair for extended periods of time. Suspect edema partially from leg being depended. Elevate LE at least 3 times daily. Has been taking lasix 40 mg daily. Reports cardiologist advised that he may add additional lasix occasionally.  No shortness of breath.  Reports sinus drainage, nasal congestion. Denies shortness of breath, fever.  Review of Systems  Constitutional: Negative for fever and chills.  HENT: Positive for congestion, postnasal drip and sinus pressure.   Respiratory: Positive for wheezing. Negative for cough and shortness of breath.   Cardiovascular: Positive for leg swelling.       Objective:   Physical Exam  Constitutional: He is oriented to person, place, and time. No distress.  Cardiovascular: Normal rate, regular rhythm and normal heart sounds.   Pulmonary/Chest: Effort normal. He has wheezes. He has no rales.  Neurological: He is alert and oriented to person, place, and time.  Skin: Skin is warm and dry.  Psychiatric: He has a normal mood and affect. His behavior is normal. Thought content normal.          Assessment & Plan:

## 2013-10-02 NOTE — Assessment & Plan Note (Signed)
Right LE edema 1+ Pt has been sitting in wheelchair for extended periods of time. Suspect edema partially from leg being depended. Elevate LE at least 3 times daily. Has been taking lasix 40 mg daily. Reports cardiologist advised that he may add additional lasix occasionally. Check metabolic panel. RTC on Friday for f/u. No rales. Mild expiratory wheezing. No shortness of breath.

## 2013-10-02 NOTE — Assessment & Plan Note (Signed)
No shortness of breath. Use ProAir 3 times a day for 3 days. Start Azithromycin. RTC on Friday for F/U

## 2013-10-03 ENCOUNTER — Encounter: Payer: Self-pay | Admitting: Adult Health

## 2013-10-03 LAB — BASIC METABOLIC PANEL
CO2: 30 mEq/L (ref 19–32)
Chloride: 104 mEq/L (ref 96–112)
Creatinine, Ser: 1.5 mg/dL (ref 0.4–1.5)
GFR: 50.65 mL/min — ABNORMAL LOW (ref >60.00)
Potassium: 5 mEq/L (ref 3.5–5.1)

## 2013-10-05 ENCOUNTER — Ambulatory Visit: Payer: Medicare Other | Admitting: Adult Health

## 2013-10-08 ENCOUNTER — Other Ambulatory Visit: Payer: Self-pay | Admitting: Internal Medicine

## 2013-10-11 MED ORDER — OXYCODONE-ACETAMINOPHEN 5-325 MG PO TABS: 1.0000 | ORAL_TABLET | Freq: Four times a day (QID) | ORAL | Status: DC | PRN

## 2013-10-11 MED ORDER — OXYCODONE HCL ER 60 MG PO T12A: 1.0000 | EXTENDED_RELEASE_TABLET | Freq: Two times a day (BID) | ORAL | Status: DC | PRN

## 2013-10-11 NOTE — Telephone Encounter (Signed)
Patient notified script ready

## 2013-10-11 NOTE — Telephone Encounter (Signed)
Ok to refill,  printed rx  Can refill on or after jan 10

## 2013-10-19 ENCOUNTER — Encounter: Payer: Self-pay | Admitting: Physical Medicine and Rehabilitation

## 2013-10-25 ENCOUNTER — Telehealth: Payer: Self-pay

## 2013-10-25 ENCOUNTER — Ambulatory Visit (INDEPENDENT_AMBULATORY_CARE_PROVIDER_SITE_OTHER): Payer: Medicare Other | Admitting: Physician Assistant

## 2013-10-25 ENCOUNTER — Ambulatory Visit: Payer: Medicare Other | Admitting: Adult Health

## 2013-10-25 ENCOUNTER — Encounter: Payer: Self-pay | Admitting: Physician Assistant

## 2013-10-25 VITALS — BP 128/70 | HR 87 | Ht 70.0 in

## 2013-10-25 DIAGNOSIS — J449 Chronic obstructive pulmonary disease, unspecified: Secondary | ICD-10-CM

## 2013-10-25 DIAGNOSIS — I509 Heart failure, unspecified: Secondary | ICD-10-CM

## 2013-10-25 DIAGNOSIS — F17200 Nicotine dependence, unspecified, uncomplicated: Secondary | ICD-10-CM

## 2013-10-25 DIAGNOSIS — J441 Chronic obstructive pulmonary disease with (acute) exacerbation: Secondary | ICD-10-CM

## 2013-10-25 DIAGNOSIS — I959 Hypotension, unspecified: Secondary | ICD-10-CM

## 2013-10-25 DIAGNOSIS — I25810 Atherosclerosis of coronary artery bypass graft(s) without angina pectoris: Secondary | ICD-10-CM

## 2013-10-25 DIAGNOSIS — R195 Other fecal abnormalities: Secondary | ICD-10-CM

## 2013-10-25 DIAGNOSIS — I255 Ischemic cardiomyopathy: Secondary | ICD-10-CM

## 2013-10-25 DIAGNOSIS — I2589 Other forms of chronic ischemic heart disease: Secondary | ICD-10-CM

## 2013-10-25 DIAGNOSIS — I5023 Acute on chronic systolic (congestive) heart failure: Secondary | ICD-10-CM

## 2013-10-25 DIAGNOSIS — R0602 Shortness of breath: Secondary | ICD-10-CM

## 2013-10-25 MED ORDER — DM-GUAIFENESIN ER 30-600 MG PO TB12
1.0000 | ORAL_TABLET | Freq: Two times a day (BID) | ORAL | Status: DC
Start: 2013-10-25 — End: 2014-03-11

## 2013-10-25 NOTE — Patient Instructions (Signed)
We will check labwork today.   Please take Lasix 60mg  (three tablets) daily on an empty stomach for 3 days.   We will see you back early next week to monitor your progress.   We will check an ultrasound of your heart next week.   Please limit fluid intake to less than 2 L per day.   Please limit salt intake to less than 2 grams of sodium per day.   Please wear compression stockings/elevate legs at rest.   Ensure blood pressure is well-controlled.   Avoid excessive alcohol intake.  Avoid excessive use of NSAIDs (such as ibuprofen, Aleve, Advil, Celebrex). Tylenol as needed will not worsen heart failure.   Weigh yourself daily. Take an extra diuretic (water pill) if weight increases by 3 lbs in one day or 5 lbs to two days.   Continue to monitor symptoms- shortness of breath, swelling in legs or abdomen, weight increase, reduced activity level, difficulty breathing while laying flat or waking up in the middle of the night short of breath.

## 2013-10-25 NOTE — Telephone Encounter (Signed)
Spoke w/ Ines BloomerShawn.  She reports that pt is retaining fluid, affecting his breathing, increased sob, leg edema. Pt had leg amputated in July, so he no longer weighs himself daily.  Pt spoke w/ Raquel Rey over the weekend via email and she is concerned that lasix may possibly be dehydrating him.  Pt to see Alinda Moneyony, [place] this afternoon at 2:15.

## 2013-10-25 NOTE — Telephone Encounter (Signed)
Pt daughter called and states she is concerned about pt "fluid build up" . States she called in on Saturday, and the Dr. Catalina Pizzaold pt to take total of 80 mg Isosorbide. That did help, but since then has noticed more fluid. Please call.

## 2013-10-25 NOTE — Progress Notes (Signed)
Patient ID: Patrick [name] Heinke, male   DOB: 07/31/1941, 73 y.o.   MRN: [id number]   Date:  10/25/2013   ID:  Patrick [name] Emami, DOB 07/31/1941, MRN [id number]  PCP:  Duncan DullULLO,TERESA, MD  Primary Cardiologist:  Concha Se. Gollan, MD   History of Present Illness:  Patrick [name] Ivan is a 73 y.o. 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   He was last seen in 12/2012. He had many of the same complaints then as he does today.   He reports experiencing progressive bilateral LE edema extending to his mid-thigh, SOB on minimal exertion (wheelchair bound currently, still learning to ambulate with prosthesis). He denies chest pain, PND, orthopnea. He has noticed an increased freq in cough now productive of yellow sputum. No fevers or chills. Daughter accompanies him today, and is concerned about this progressive decline. He has continued to take Lasix 40 daily. He doubled up the other day to 80, had significant UOP and reduced swelling, but could not tolerate increased urinary frequency. Denies excessive salt/fluid intake. BP well-controlled, actually low at times. No EtOH. Continues to smoke. Has not established a baseline weight since BKA. He does note occasional dark stools, no frank blood. Takes iron.   EKG: NSR, 87 bpm, RAD, inferior IVCD, no ST/T changes  Wt Readings from Last 3 Encounters:  10/02/13 174 lb (78.926 kg)  07/10/13 165 lb (74.844 kg)  03/09/13 171 lb (77.565 kg)     Past Medical History  Diagnosis Date  . Hypertension   . Hyperlipidemia   . Coronary artery disease     lbbb  . PAD (peripheral artery disease)   . Diabetes mellitus     poorly controlled  . Anxiety   . Tobacco abuse   . Peripheral vascular disease     with prior stents placed bilaterally  .  MI (myocardial infarction) 2000  . Anemia   . COPD exacerbation   . Acute renal failure     due to ATN versus hypovolemia  . Acute exacerbation of CHF (congestive heart failure)     EF 25%,  . COPD (chronic obstructive pulmonary disease)     Current Outpatient Prescriptions  Medication Sig Dispense Refill  . albuterol (PROAIR HFA) 108 (90 BASE) MCG/ACT inhaler INHALE 2 PUFFS BY MOUTH EVERY 6 HOURS AS NEEDED FOR SHORTNESS OF BREATH OR WHEEZING  8.5 g  11  . aspirin 81 MG tablet Take 81 mg by mouth daily.      Marland Kitchen. azithromycin (ZITHROMAX) 250 MG tablet Take 2 tablets today then 1 tablet daily for the next 4 days.  6 tablet  0  . carvedilol (COREG) 12.5 MG tablet TAKE 1 TABLET BY MOUTH TWICE DAILY WITH A MEAL  60 tablet  5  . dimenhyDRINATE (DRAMAMINE) 50 MG tablet Take 50 mg by mouth every 8 (eight) hours as needed.        . DULoxetine (CYMBALTA) 60 MG capsule       . escitalopram (LEXAPRO) 10 MG tablet TAKE 1 TABLET BY MOUTH EVERY DAY  30 tablet  2  . esomeprazole (NEXIUM) 40 MG capsule Take 1 capsule (40 mg total) by mouth 2 (two) times daily.  180 capsule  0  . ferrous sulfate 325 (65 FE) MG tablet TAKE 1 TABLET BY  MOUTH EVERY DAY WITH A MEAL  90 tablet  1  . furosemide (LASIX) 20 MG tablet Take 40 mg by mouth daily.      Marland Kitchen gabapentin (NEURONTIN) 300 MG capsule TAKE 1 CAPSULE BY MOUTH EVERY DAY  30 capsule  0  . Insulin Syringe-Needle U-100 (INSULIN SYRINGE 1CC/30GX5/16") 30G X 5/16" 1 ML MISC USE FOUR TIMES DAILY  200 each  7  . ipratropium-albuterol (DUONEB) 0.5-2.5 (3) MG/3ML SOLN Take 3 mLs by nebulization every 4 (four) hours as needed.      . isosorbide mononitrate (IMDUR) 30 MG 24 hr tablet TAKE 1 TABLET BY MOUTH EVERY DAY  30 tablet  6  . LANTUS 100 UNIT/ML injection Inject 25 Units into the skin 2 (two) times daily.      . mirtazapine (REMERON) 30 MG tablet TAKE 1 TABLET BY MOUTH AT BEDTIME AT 9PM  30 tablet  2  . nitroGLYCERIN (NITROSTAT) 0.4 MG SL tablet Place 1 tablet (0.4 mg  total) under the tongue every 5 (five) minutes as needed.  30 tablet  6  . OxyCODONE HCl ER (OXYCONTIN) 60 MG T12A Take 1 tablet by mouth every 12 (twelve) hours as needed. For severe pain  60 each  0  . oxyCODONE-acetaminophen (PERCOCET/ROXICET) 5-325 MG per tablet Take 1 tablet by mouth every 6 (six) hours as needed.  120 tablet  0  . simvastatin (ZOCOR) 40 MG tablet TAKE 1 TABLET BY MOUTH EVERY NIGHT AT BEDTIME FOR CHOLESTEROL  90 tablet  1  . SPIRIVA HANDIHALER 18 MCG inhalation capsule INHALE CONTENTS OF 1 CAPSULE ONCE DAILY USING HANDIHALER AT 8AM  30 capsule  5  . TDaP (BOOSTRIX) 5-2.5-18.5 LF-MCG/0.5 injection Inject 0.5 mLs into the muscle once.  0.5 mL  0  . traZODone (DESYREL) 50 MG tablet Take 1 tablet (50 mg total) by mouth at bedtime.  90 tablet  1  . zoster vaccine live, PF, (ZOSTAVAX) 16109 UNT/0.65ML injection Inject 19,400 Units into the skin once.  1 each  0  . dextromethorphan-guaiFENesin (MUCINEX DM) 30-600 MG per 12 hr tablet Take 1 tablet by mouth 2 (two) times daily.  14 tablet  0  . [DISCONTINUED] metoprolol tartrate (LOPRESSOR) 25 MG tablet Take 1/2 tablet by mouth twice daily  30 tablet  6   No current facility-administered medications for this visit.    Allergies:    Allergies  Allergen Reactions  . Amoxicillin     Sick on stomach    Social History:  The patient  reports that he quit smoking about 7 months ago. His smoking use included Cigarettes. He has a 15 pack-year smoking history. He has never used smokeless tobacco. He reports that he does not drink alcohol or use illicit drugs.   Family History:  Family History  Problem Relation Age of Onset  . Heart attack Father   . Stroke Mother     Review of Systems: General: negative for chills, fever, night sweats or weight changes.  Cardiovascular: positive for edema, SOB/DOE, negative for chest pain, orthopnea, palpitations, paroxysmal nocturnal dyspnea Dermatological: negative for rash Respiratory:  positive for cough and wheezing Urologic: negative for hematuria Abdominal:  negative for nausea, vomiting, diarrhea, bright red blood per rectum, melena, or hematemesis Neurologic: negative for visual changes, syncope, or dizziness All other systems reviewed and are otherwise negative except as noted above.  PHYSICAL EXAM: VS:  BP 128/70  Pulse 87  Ht 5\' 10"  (1.778 m) Thin Caucasian male, appearing older than stated age  and seated in a wheelchair. In no acute distress. HEENT: normal, PERRL Neck: JVP to earlobes, no bruits Cardiac:  normal S1, S2; RRR; no murmur or gallops Lungs: no dyspnea, tachypnea, diffusely diminished breath sounds, trace wheezing, no appreciable rales or rhonchi Abd: soft, nontender, no hepatomegaly, normoactive BS x 4 quads Ext: L BKA appreciated, bilateral LE 2+ pitting edema, induration extending to mid-thigh bilaterally Skin: warm and dry, cap refill < 2 sec Neuro:  CNs 2-12 intact, no focal abnormalities noted Musculoskeletal: strength and tone appropriate for age  Psych: normal affect

## 2013-10-26 ENCOUNTER — Telehealth: Payer: Self-pay | Admitting: *Deleted

## 2013-10-26 DIAGNOSIS — J449 Chronic obstructive pulmonary disease, unspecified: Secondary | ICD-10-CM | POA: Insufficient documentation

## 2013-10-26 DIAGNOSIS — I5023 Acute on chronic systolic (congestive) heart failure: Secondary | ICD-10-CM | POA: Insufficient documentation

## 2013-10-26 LAB — COMPREHENSIVE METABOLIC PANEL
ALBUMIN: 2.9 g/dL — AB (ref 3.5–4.8)
ALT: 6 IU/L (ref 0–44)
AST: 6 IU/L (ref 0–40)
Albumin/Globulin Ratio: 0.9 — ABNORMAL LOW (ref 1.1–2.5)
Alkaline Phosphatase: 115 IU/L (ref 39–117)
BUN/Creatinine Ratio: 26 — ABNORMAL HIGH (ref 10–22)
BUN: 33 mg/dL — AB (ref 8–27)
CHLORIDE: 100 mmol/L (ref 97–108)
CO2: 29 mmol/L (ref 18–29)
Calcium: 8.4 mg/dL — ABNORMAL LOW (ref 8.6–10.2)
Creatinine, Ser: 1.25 mg/dL (ref 0.76–1.27)
GFR calc non Af Amer: 58 mL/min/{1.73_m2} — ABNORMAL LOW (ref >59)
GFR, EST AFRICAN AMERICAN: 67 mL/min/{1.73_m2} (ref >59)
Globulin, Total: 3.3 g/dL (ref 1.5–4.5)
Glucose: 351 mg/dL — ABNORMAL HIGH (ref 65–99)
Potassium: 4.5 mmol/L (ref 3.5–5.2)
Sodium: 144 mmol/L (ref 134–144)
Total Bilirubin: 0.2 mg/dL (ref 0.0–1.2)
Total Protein: 6.2 g/dL (ref 6.0–8.5)

## 2013-10-26 LAB — CBC
HCT: 41 % (ref 37.5–51.0)
HEMOGLOBIN: 12.1 g/dL — AB (ref 12.6–17.7)
MCH: 25.3 pg — ABNORMAL LOW (ref 26.6–33.0)
MCHC: 29.5 g/dL — AB (ref 31.5–35.7)
MCV: 86 fL (ref 79–97)
Platelets: 250 10*3/uL (ref 150–379)
RBC: 4.78 x10E6/uL (ref 4.14–5.80)
RDW: 17.1 % — ABNORMAL HIGH (ref 12.3–15.4)
WBC: 5.8 10*3/uL (ref 3.4–10.8)

## 2013-10-26 LAB — TSH: TSH: 2.9 u[IU]/mL (ref 0.450–4.500)

## 2013-10-26 LAB — PRO B NATRIURETIC PEPTIDE: NT-Pro BNP: 27883 pg/mL — ABNORMAL HIGH (ref 0–124)

## 2013-10-26 NOTE — Assessment & Plan Note (Signed)
Cessation strongly advised. Comorbidities will continue to decline as long as he continues to smoke.

## 2013-10-26 NOTE — Assessment & Plan Note (Signed)
Recently completed a course of azithromycin. Not dyspneic on exam. Lungs diminished diffusely from COPD. No significant adventitious lung sounds. Unclear if cough is due to heart failure or COPD flare. He has productive cough at baseline. Discussed mucolytics PRN. If worsens, he knows to contact his PCP for further recommendations. Again, would have preferred to work-up/manage as an inpatient.  

## 2013-10-26 NOTE — Assessment & Plan Note (Addendum)
The patient presents w/ clear signs and symptoms of decompensated heart failure. He has a history of ischemic cardiomyopathy, last known EF 25% in 2013. He denies chest pain. No ischemic changes on EKG. He does continue to smoke and COPD has likely progressed. He has right axis deviation on EKG today. Edema appears to be mostly R sided. 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 Additional exacerbating factors may include progressive diabetic nephropathy and insufficient diuretic doses, COPD exacerbation, anemia is another consideration. We had a prolonged discussion about several outpatient challenges to diuresis,but he refuses to be admitted to the hospital for this. He wants to try and manage volume overload at home. He will need close follow-up with us next week to monitor progress. Update echo at that time. Advised to start Lasix 80mg , however, he wishes to start lower as he did not tolerate increased UOP on this dose. Will start at 60mg  daily for now. Check BMET, CBC, TSH and pro BNP today, BMET on follow-up next week. If renal function severely reduced or markedly anemic, he agrees for inpatient admission. He also agrees that if outpatient management is insufficient, he will go to the hospital for diuresis. Agree w/ leg elevation/compression stockings. CVI/dependent edema contributing. Provided non-pharmacologic CHF education as well. Based on echo findings and decompensation, may need to consider ischemic eval after managing volume overload.

## 2013-10-26 NOTE — Assessment & Plan Note (Signed)
Well-controlled today. Continue current antihypertensives. 

## 2013-10-26 NOTE — Assessment & Plan Note (Deleted)
Recently completed a course of azithromycin. Not dyspneic on exam. Lungs diminished diffusely from COPD. No significant adventitious lung sounds. Unclear if cough is due to heart failure or COPD flare. He has productive cough at baseline. Discussed mucolytics PRN. If worsens, he knows to contact his PCP for further recommendations. Again, would have preferred to work-up/manage as an inpatient.

## 2013-10-26 NOTE — Assessment & Plan Note (Addendum)
Intermittent. H/o GIB requiring transfusions. Takes iron as well. Check CBC today.

## 2013-10-26 NOTE — Telephone Encounter (Signed)
Called [facility] for PA in the Oxycontin, they said as along as the pt is not taking more than 2 a day that it will be covered

## 2013-10-30 ENCOUNTER — Telehealth: Payer: Self-pay | Admitting: Internal Medicine

## 2013-10-30 NOTE — Telephone Encounter (Signed)
Relevant patient education assigned to patient using Emmi. ° °

## 2013-10-31 ENCOUNTER — Encounter: Payer: Self-pay | Admitting: Cardiovascular Disease

## 2013-10-31 ENCOUNTER — Ambulatory Visit (INDEPENDENT_AMBULATORY_CARE_PROVIDER_SITE_OTHER): Payer: Medicare Other | Admitting: Cardiovascular Disease

## 2013-10-31 VITALS — BP 118/60 | HR 84 | Ht 70.0 in | Wt 179.5 lb

## 2013-10-31 DIAGNOSIS — I739 Peripheral vascular disease, unspecified: Secondary | ICD-10-CM

## 2013-10-31 DIAGNOSIS — I509 Heart failure, unspecified: Secondary | ICD-10-CM

## 2013-10-31 DIAGNOSIS — I5023 Acute on chronic systolic (congestive) heart failure: Secondary | ICD-10-CM

## 2013-10-31 DIAGNOSIS — F17200 Nicotine dependence, unspecified, uncomplicated: Secondary | ICD-10-CM

## 2013-10-31 DIAGNOSIS — I10 Essential (primary) hypertension: Secondary | ICD-10-CM

## 2013-10-31 DIAGNOSIS — J449 Chronic obstructive pulmonary disease, unspecified: Secondary | ICD-10-CM

## 2013-10-31 DIAGNOSIS — I25810 Atherosclerosis of coronary artery bypass graft(s) without angina pectoris: Secondary | ICD-10-CM

## 2013-10-31 DIAGNOSIS — E785 Hyperlipidemia, unspecified: Secondary | ICD-10-CM

## 2013-10-31 MED ORDER — TORSEMIDE 20 MG PO TABS: 40.0000 mg | ORAL_TABLET | Freq: Two times a day (BID) | ORAL | Status: DC

## 2013-10-31 NOTE — Assessment & Plan Note (Signed)
No dramatic improvement in his heart failure symptoms, still with severe pitting edema of his legs.  We will change his Lasix over to torsemide 40 mg twice a day. We'll see him back in close followup. Instructed him to call our office for more than 5 weight loss. Lab work would be done at that time. Instructed him to minimize his fluid intake

## 2013-10-31 NOTE — Assessment & Plan Note (Signed)
We have encouraged him to continue to work on weaning his cigarettes and smoking cessation. He will continue to work on this and does not want any assistance with chantix.  

## 2013-10-31 NOTE — Progress Notes (Signed)
Patient ID: Patrick [name], male    DOB: [birth year], 73 y.o.   MRN: [id number]  HPI Comments: Patrick [name] is a 73 year-old with a history of smoking who continues to smoke, coronary artery disease, MI in 2000 with CABG at that time, followup cardiac catheterization several years ago at [facility] that was reportedly "okay ", peripheral vascular disease with history of intervention to the legs in June 2011, history of GI bleed in 2011 requiring bypass fusion x4 who presented to [facility] in mid February 2013 with shortness of breath presenting over several weeks. He was admitted with systolic CHF. He also had renal dysfunction likely secondary to long-standing poorly controlled diabetes. Hemoglobin A1c previously 10,  down to 9.  Echocardiogram at that time showed severely depressed and weak function of less than 25%, diastolic dysfunction, moderately elevated right ventricular systolic pressures  During his hospital course, heart failure regimen was started with low-dose ACE, beta blocker, diuretic with improvement of his symptoms.  ACE inhibitor held in the past secondary to renal insufficiency On previous office visits, as not been taking his Lasix on a regular basis.    In followup today, weight is up, significant pitting edema of his lower extremities extending into his abdomen. Seen 2 weeks ago in the clinic and instructed to increase Lasix from 40 mg up to 60 mg daily. He's not noticed any significant improvement of his symptoms. Still has shortness of breath, thick cough, nonproductive.    Outpatient Encounter Prescriptions as of 10/31/2013  Medication Sig  . aspirin 81 MG tablet Take 81 mg by mouth daily.  . carvedilol (COREG) 12.5 MG tablet TAKE 1 TABLET BY MOUTH TWICE DAILY WITH A MEAL  . dextromethorphan-guaiFENesin (MUCINEX DM) 30-600 MG per 12 hr tablet Take 1 tablet by mouth 2 (two) times daily.  Marland Kitchen dimenhyDRINATE (DRAMAMINE) 50 MG tablet Take 50 mg by mouth every 8 (eight) hours as needed.     . DULoxetine (CYMBALTA) 60 MG capsule Take 60 mg by mouth daily.   Marland Kitchen escitalopram (LEXAPRO) 10 MG tablet TAKE 1 TABLET AS NEEDED.  Marland Kitchen esomeprazole (NEXIUM) 40 MG capsule Take 1 capsule (40 mg total) by mouth 2 (two) times daily.  . ferrous sulfate 325 (65 FE) MG tablet TAKE 1 TABLET TWICE DAILY  WITH A MEAL  . furosemide (LASIX) 40 MG tablet Take by mouth. Takes 1 1/2 tablet daily.  Marland Kitchen gabapentin (NEURONTIN) 300 MG capsule TAKE 1 CAPSULE BY MOUTH EVERY DAY  . Insulin Syringe-Needle U-100 (INSULIN SYRINGE 1CC/30GX5/16") 30G X 5/16" 1 ML MISC USE FOUR TIMES DAILY  . ipratropium-albuterol (DUONEB) 0.5-2.5 (3) MG/3ML SOLN Take 3 mLs by nebulization every 4 (four) hours as needed.  . isosorbide mononitrate (IMDUR) 30 MG 24 hr tablet TAKE 1 TABLET BY MOUTH EVERY DAY  . LANTUS 100 UNIT/ML injection Inject 25 Units into the skin 2 (two) times daily.  . mirtazapine (REMERON) 30 MG tablet TAKE 1 TABLET BY MOUTH AT BEDTIME AT 9PM  . nitroGLYCERIN (NITROSTAT) 0.4 MG SL tablet Place 1 tablet (0.4 mg total) under the tongue every 5 (five) minutes as needed.  . OxyCODONE HCl ER (OXYCONTIN) 60 MG T12A Take 1 tablet by mouth every 12 (twelve) hours as needed. For severe pain  . oxyCODONE-acetaminophen (PERCOCET/ROXICET) 5-325 MG per tablet Take 1 tablet by mouth every 6 (six) hours as needed.  . simvastatin (ZOCOR) 40 MG tablet TAKE 1 TABLET BY MOUTH EVERY NIGHT AT BEDTIME FOR CHOLESTEROL  . SPIRIVA HANDIHALER 18 MCG  inhalation capsule INHALE CONTENTS OF 1 CAPSULE ONCE DAILY USING HANDIHALER AT 8AM  . TDaP (BOOSTRIX) 5-2.5-18.5 LF-MCG/0.5 injection Inject 0.5 mLs into the muscle once.  . traZODone (DESYREL) 50 MG tablet Take 1 tablet (50 mg total) by mouth at bedtime.  . zoster vaccine live, PF, (ZOSTAVAX) [id number] UNT/0.65ML injection Inject 19,400 Units into the skin once.    Review of Systems  Constitutional: Negative.   HENT: Negative.   Eyes: Positive for visual disturbance.  Respiratory: Positive for  cough and shortness of breath.   Cardiovascular: Positive for leg swelling.  Gastrointestinal: Negative.   Endocrine: Negative.   Musculoskeletal: Negative.   Skin: Negative.   Allergic/Immunologic: Negative.   Neurological: Negative.   Hematological: Negative.   Psychiatric/Behavioral: Negative.   All other systems reviewed and are negative.   BP 118/60  Pulse 84  Ht 5\' 10"  (1.778 m)  Wt 179 lb 8 oz (81.421 kg)  BMI 25.76 kg/m2  Physical Exam  Nursing note and vitals reviewed. Constitutional: He is oriented to person, place, and time. He appears well-developed and well-nourished.  HENT:  Head: Normocephalic.  Nose: Nose normal.  Mouth/Throat: Oropharynx is clear and moist.  Eyes: Conjunctivae are normal. Pupils are equal, round, and reactive to light.  Neck: Normal range of motion. Neck supple. No JVD present. Carotid bruit is present.  Cardiovascular: Normal rate, regular rhythm, S1 normal, S2 normal and intact distal pulses.  Exam reveals no gallop and no friction rub.   Murmur heard.  Crescendo systolic murmur is present with a grade of 2/6  2+ pitting edema, "woody" extending to the upper thighs, mild abdominal swelling  Pulmonary/Chest: Effort normal. No respiratory distress. He has decreased breath sounds. He has no wheezes. He has no rales. He exhibits no tenderness.  Abdominal: Soft. Bowel sounds are normal. He exhibits no distension. There is no tenderness.  Musculoskeletal: Normal range of motion. He exhibits no edema and no tenderness.  Lymphadenopathy:    He has no cervical adenopathy.  Neurological: He is alert and oriented to person, place, and time. Coordination normal.  Skin: Skin is warm and dry. No rash noted. No erythema.  Psychiatric: He has a normal mood and affect. His behavior is normal. Judgment and thought content normal.      Assessment and Plan

## 2013-10-31 NOTE — Patient Instructions (Addendum)
Please hold the lasix/furosemide Start torsemide 40 mg in the morning and after lunch (2 of the 20 mg pills twice a day) Monitor your weight Call the office after weight is down 7 pounds We will check lab work  Please call us if you have new issues that need to be addressed before your next appt.  Your physician wants you to follow-up in: 3 weeks

## 2013-10-31 NOTE — Assessment & Plan Note (Signed)
Current shortness of breath and cough likely from mixed picture, COPD with systolic heart failure.

## 2013-10-31 NOTE — Assessment & Plan Note (Signed)
Blood pressure is well controlled on today's visit. No changes made to the medications. 

## 2013-10-31 NOTE — Assessment & Plan Note (Signed)
Recommended he stay on his simvastatin

## 2013-10-31 NOTE — Assessment & Plan Note (Signed)
Currently with no symptoms of angina. No further workup at this time. Continue current medication regimen. 

## 2013-10-31 NOTE — Assessment & Plan Note (Signed)
Continue aggressive cholesterol management. Goal LDL less than 70 

## 2013-11-01 ENCOUNTER — Other Ambulatory Visit: Payer: Medicare Other

## 2013-11-01 DIAGNOSIS — R0989 Other specified symptoms and signs involving the circulatory and respiratory systems: Secondary | ICD-10-CM

## 2013-11-05 ENCOUNTER — Encounter: Payer: Self-pay | Admitting: *Deleted

## 2013-11-05 ENCOUNTER — Other Ambulatory Visit: Payer: Self-pay | Admitting: Internal Medicine

## 2013-11-05 DIAGNOSIS — E1151 Type 2 diabetes mellitus with diabetic peripheral angiopathy without gangrene: Secondary | ICD-10-CM

## 2013-11-05 DIAGNOSIS — D649 Anemia, unspecified: Secondary | ICD-10-CM

## 2013-11-05 DIAGNOSIS — E785 Hyperlipidemia, unspecified: Secondary | ICD-10-CM

## 2013-11-05 MED ORDER — OXYCODONE-ACETAMINOPHEN 5-325 MG PO TABS: 1.0000 | ORAL_TABLET | Freq: Four times a day (QID) | ORAL | Status: DC | PRN

## 2013-11-05 MED ORDER — OXYCODONE HCL ER 60 MG PO T12A: 1.0000 | EXTENDED_RELEASE_TABLET | Freq: Two times a day (BID) | ORAL | Status: DC | PRN

## 2013-11-05 NOTE — Telephone Encounter (Signed)
Last visit 07/10/13.

## 2013-11-05 NOTE — Telephone Encounter (Signed)
Ok to refill,  printed rx .  Needs DM follow up ASAP with fasting labs prior

## 2013-11-15 ENCOUNTER — Other Ambulatory Visit: Payer: Self-pay | Admitting: Internal Medicine

## 2013-11-16 ENCOUNTER — Encounter: Payer: Self-pay | Admitting: Cardiovascular Disease

## 2013-11-17 ENCOUNTER — Ambulatory Visit: Payer: Self-pay | Admitting: Internal Medicine

## 2013-11-23 ENCOUNTER — Ambulatory Visit: Payer: Medicare Other | Admitting: Cardiovascular Disease

## 2013-12-03 ENCOUNTER — Other Ambulatory Visit: Payer: Self-pay | Admitting: Internal Medicine

## 2013-12-03 MED ORDER — OXYCODONE HCL ER 60 MG PO T12A: 1.0000 | EXTENDED_RELEASE_TABLET | Freq: Two times a day (BID) | ORAL | Status: DC | PRN

## 2013-12-03 MED ORDER — OXYCODONE-ACETAMINOPHEN 5-325 MG PO TABS: 1.0000 | ORAL_TABLET | Freq: Four times a day (QID) | ORAL | Status: DC | PRN

## 2013-12-03 NOTE — Telephone Encounter (Signed)
Ok to refill,  printed rx  Not due until march 9

## 2013-12-03 NOTE — Telephone Encounter (Signed)
Refill? Sent message regarding need  for follow up appointment and fasting labs prior.

## 2013-12-04 NOTE — Telephone Encounter (Signed)
patienT notifeid

## 2013-12-05 ENCOUNTER — Telehealth: Payer: Self-pay | Admitting: Internal Medicine

## 2013-12-05 NOTE — Telephone Encounter (Signed)
Placed prior authorization I started for patient in Red folder .

## 2013-12-06 ENCOUNTER — Encounter: Payer: Self-pay | Admitting: Internal Medicine

## 2013-12-06 DIAGNOSIS — M47816 Spondylosis without myelopathy or radiculopathy, lumbar region: Secondary | ICD-10-CM | POA: Insufficient documentation

## 2013-12-06 DIAGNOSIS — I739 Peripheral vascular disease, unspecified: Secondary | ICD-10-CM | POA: Insufficient documentation

## 2013-12-10 ENCOUNTER — Telehealth: Payer: Self-pay | Admitting: Internal Medicine

## 2013-12-10 NOTE — Telephone Encounter (Signed)
Shawn states she was to call back with [place] for [place].  States insurance information for [employer] is [employer], Phone# [phone].

## 2013-12-10 NOTE — Telephone Encounter (Signed)
Calling to check status of oxycontin refill.  States the pt took his last one yesterday.  States she was told it needed authorization.  Shawn asking for a call.

## 2013-12-10 NOTE — Telephone Encounter (Signed)
Prior faxed 12/07/13 re-faxed to day.

## 2013-12-10 NOTE — Telephone Encounter (Signed)
Called insurance an  Was approved for authorization for one year patient notified

## 2013-12-15 ENCOUNTER — Other Ambulatory Visit: Payer: Self-pay | Admitting: Internal Medicine

## 2013-12-16 DIAGNOSIS — Z0279 Encounter for issue of other medical certificate: Secondary | ICD-10-CM

## 2013-12-20 ENCOUNTER — Other Ambulatory Visit: Payer: Self-pay | Admitting: Internal Medicine

## 2013-12-28 ENCOUNTER — Other Ambulatory Visit: Payer: Self-pay | Admitting: Internal Medicine

## 2013-12-28 NOTE — Telephone Encounter (Signed)
Appt 12/31/13.

## 2013-12-31 ENCOUNTER — Ambulatory Visit (INDEPENDENT_AMBULATORY_CARE_PROVIDER_SITE_OTHER): Payer: Medicare Other | Admitting: Internal Medicine

## 2013-12-31 ENCOUNTER — Encounter: Payer: Self-pay | Admitting: Internal Medicine

## 2013-12-31 VITALS — BP 112/58 | HR 69 | Temp 98.5°F | Resp 16

## 2013-12-31 DIAGNOSIS — R600 Localized edema: Secondary | ICD-10-CM

## 2013-12-31 DIAGNOSIS — E1159 Type 2 diabetes mellitus with other circulatory complications: Secondary | ICD-10-CM

## 2013-12-31 DIAGNOSIS — E1165 Type 2 diabetes mellitus with hyperglycemia: Principal | ICD-10-CM

## 2013-12-31 DIAGNOSIS — I25810 Atherosclerosis of coronary artery bypass graft(s) without angina pectoris: Secondary | ICD-10-CM

## 2013-12-31 DIAGNOSIS — E1129 Type 2 diabetes mellitus with other diabetic kidney complication: Secondary | ICD-10-CM

## 2013-12-31 DIAGNOSIS — E1151 Type 2 diabetes mellitus with diabetic peripheral angiopathy without gangrene: Secondary | ICD-10-CM

## 2013-12-31 DIAGNOSIS — R062 Wheezing: Secondary | ICD-10-CM

## 2013-12-31 DIAGNOSIS — I10 Essential (primary) hypertension: Secondary | ICD-10-CM

## 2013-12-31 DIAGNOSIS — M48062 Spinal stenosis, lumbar region with neurogenic claudication: Secondary | ICD-10-CM

## 2013-12-31 DIAGNOSIS — R609 Edema, unspecified: Secondary | ICD-10-CM

## 2013-12-31 DIAGNOSIS — F17200 Nicotine dependence, unspecified, uncomplicated: Secondary | ICD-10-CM

## 2013-12-31 DIAGNOSIS — I739 Peripheral vascular disease, unspecified: Secondary | ICD-10-CM

## 2013-12-31 DIAGNOSIS — D649 Anemia, unspecified: Secondary | ICD-10-CM

## 2013-12-31 MED ORDER — OXYCODONE-ACETAMINOPHEN 5-325 MG PO TABS: 1.0000 | ORAL_TABLET | Freq: Four times a day (QID) | ORAL | Status: DC | PRN

## 2013-12-31 MED ORDER — OXYCODONE HCL ER 60 MG PO T12A: 1.0000 | EXTENDED_RELEASE_TABLET | Freq: Two times a day (BID) | ORAL | Status: DC | PRN

## 2013-12-31 NOTE — Progress Notes (Signed)
Pre-visit discussion using our clinic review tool. No additional management support is needed unless otherwise documented below in the visit note.  

## 2013-12-31 NOTE — Patient Instructions (Signed)
Your nausea may be from the iron or from the torsemide  causing dehydration   Your labs today may help me decide what to do.  Please take your iron  WITH MEALS to avoid nausea  PLEASE CONSIDER QUITTNG SMOKING AGAIN!!

## 2013-12-31 NOTE — Assessment & Plan Note (Addendum)
His last  Was  the best it has been in years, reflecting the 6 weeks he spent in rehabilitation where his compliance was ensured. I will recommended at last visit in September  that he continue to use the insulin regimen that was used during  rehabilitation and allow me to adjust his insulin levels monthly.   Using Lantus 30 units bid and has not followed up with me monthly as requested.  Lab Results  Component Value Date   HGBA1C 7.3* 06/19/2013   .

## 2013-12-31 NOTE — Progress Notes (Signed)
Patient ID: Patrick [name], male   DOB: 1940-11-05, 73 y.o.   MRN: [id number]   Patient Active Problem List   Diagnosis Date Noted  . DJD (degenerative joint disease), lumbar 12/06/2013  . PAD (peripheral artery disease) 12/06/2013  . Acute on chronic systolic CHF (congestive heart failure), NYHA class 3 10/26/2013  . Dark stools 10/26/2013  . COPD (chronic obstructive pulmonary disease) 10/26/2013  . Wheezing 10/02/2013  . Leg edema, right 10/02/2013  . Hematochezia 07/10/2013  . Gastritis 07/10/2013  . Hypotension, unspecified 02/23/2013  . COPD exacerbation 12/22/2012  . Bronchitis with chronic airway obstruction 10/07/2012  . Depression with anxiety 05/11/2012  . Hyperkalemia 04/16/2012  . DM type 2 causing CKD stage 3 04/16/2012  . Tobacco abuse counseling 02/14/2012  . Anemia 12/14/2011  . Cardiomyopathy, ischemic 12/14/2011  . DM (diabetes mellitus), type 2 with peripheral vascular complications   . Anxiety   . Peripheral vascular disease   . TOBACCO ABUSE 07/10/2010  . HYPERTENSION, BENIGN 07/10/2010  . CAD, AUTOLOGOUS BYPASS GRAFT 07/10/2010  . HYPERLIPIDEMIA-MIXED 07/09/2010  . EDEMA 07/09/2010    Subjective:  CC:   Chief Complaint  Patient presents with  . Follow-up    swelling in right leg    HPI:   Patrick [name] is a 73 y.o. male who presents for Follow up on chronic conditions including DM type 2 with CHD and PAD.  S.p Left BKA for gangrene .  Having persisent Right leg edema Secondary  to venous hypertension complicated by PAD.   Chronic  Sits in a wheel chair or recliner most of the time. His   lasix was changed to torsemide which helped. Taking 40 mg bid. No palpable pulse in foot.  cannot wear compression stocking  Nausea , chronic but worse lately. Takes iron supplements  bvut occasional without food  DM ;  Lost to follow up.  No sugar log. Fasting s have been "150;,  5 pm pre dinner under 200 usually. Takes insulin 30 units at bedtime and in AM  Lantus    Smoking less than a pack daily .  Started smoking again agter [facility] surgeon accused him of lying about his tobacco use.     Past Medical History  Diagnosis Date  . Hypertension   . Hyperlipidemia   . Coronary artery disease     lbbb  . PAD (peripheral artery disease)   . Diabetes mellitus     poorly controlled  . Anxiety   . Tobacco abuse   . Peripheral vascular disease     with prior stents placed bilaterally  . MI (myocardial infarction) 2000  . Anemia   . COPD exacerbation   . Acute renal failure     due to ATN versus hypovolemia  . Acute exacerbation of CHF (congestive heart failure)     EF 25%,  . COPD (chronic obstructive pulmonary disease)     Past Surgical History  Procedure Laterality Date  . Coronary artery bypass graft      x4  . Stents in legs  03/2010    [facility]  . Cholecystectomy    . Cardiac catheterization  2010    [place] [facility]  . Cataract extraction    . Cataract extraction, bilateral  2013  . Iliac artery stent    . External iliac      stent, left        The following portions of the patient's history were reviewed and updated as appropriate: Allergies, current  medications, and problem list.    Review of Systems:   Patient denies headache, fevers, malaise, unintentional weight loss, skin rash, eye pain, sinus congestion and sinus pain, sore throat, dysphagia,  hemoptysis , cough, dyspnea, wheezing, chest pain, palpitations, orthopnea, edema, abdominal pain, nausea, melena, diarrhea, constipation, flank pain, dysuria, hematuria, urinary  Frequency, nocturia, numbness, tingling, seizures,  Focal weakness, Loss of consciousness,  Tremor, insomnia, depression, anxiety, and suicidal ideation.     History   Social History  . Marital Status: Single    Spouse Name: N/A    Number of Children: N/A  . Years of Education: N/A   Occupational History  . Not on file.   Social History Main Topics  . Smoking status: Current Every  Day Smoker -- 0.50 packs/day for 30 years    Types: Cigarettes    Last Attempt to Quit: 03/19/2013  . Smokeless tobacco: Never Used     Comment: RECENTLY STOPPED  . Alcohol Use: No  . Drug Use: No  . Sexual Activity: Not on file   Other Topics Concern  . Not on file   Social History Narrative  . No narrative on file    Objective:  Filed Vitals:   12/31/13 1615  BP: 112/58  Pulse: 69  Temp: 98.5 F (36.9 C)  Resp: 16     General appearance: alert, cooperative and appears stated age Ears: normal TM's and external ear canals both ears Throat: lips, mucosa, and tongue normal; teeth and gums normal Neck: no adenopathy, no carotid bruit, supple, symmetrical, trachea midline and thyroid not enlarged, symmetric, no tenderness/mass/nodules Back: symmetric, no curvature. ROM normal. No CVA tenderness. Lungs: clear to auscultation bilaterally Heart: regular rate and rhythm, S1, S2 normal, no murmur, click, rub or gallop Abdomen: soft, non-tender; bowel sounds normal; no masses,  no organomegaly Pulses: 2+ and symmetric Skin: Skin color, texture, turgor normal. No rashes or lesions Lymph nodes: Cervical, supraclavicular, and axillary nodes normal.  Assessment and Plan:  DM (diabetes mellitus), type 2 with peripheral vascular complications His last  Was  the best it has been in years, reflecting the 6 weeks he spent in rehabilitation where his compliance was ensured. I will recommended at last visit in September  that he continue to use the insulin regimen that was used during  rehabilitation and allow me to adjust his insulin levels monthly.   Using Lantus 30 units bid and has not followed up with me monthly as requested.  Lab Results  Component Value Date   HGBA1C 7.3* 06/19/2013   .   PAD (peripheral artery disease) S/p [facility] August 2014. Prosthesis made.  Cholesterol is under control but his history of medication and lifestyle noncompliance with regard to  tobacco abuse and uncontrolled DM were the major contributors. He is smoking again.  Urged to quit     HYPERTENSION, BENIGN Well controlled on current regimen. Renal function stable, no changes today.  Lab Results  Component Value Date   CREATININE 1.25 10/25/2013     TOBACCO ABUSE Has resumed smoking , 1 pack daily in spite of PAD and recent amputation.   Leg edema, right Multifactorial,  Venous insufficiency, vasodilating medicationsn and  CHF,  Compression is C/i secondary to PAD.  Elevated leg,  Torsemide continue pending eval of rena.l function .Marland Kitchen.   A total of 40 minutes was spent with patient more than half of which was spent in counseling, reviewing records from other prviders and coordination of care.  Updated Medication List Outpatient Encounter Prescriptions as of 12/31/2013  Medication Sig  . aspirin 81 MG tablet Take 81 mg by mouth daily.  . carvedilol (COREG) 12.5 MG tablet TAKE 1 TABLET BY MOUTH TWICE DAILY WITH A MEAL  . dextromethorphan-guaiFENesin (MUCINEX DM) 30-600 MG per 12 hr tablet Take 1 tablet by mouth 2 (two) times daily.  Marland Kitchen dimenhyDRINATE (DRAMAMINE) 50 MG tablet Take 50 mg by mouth every 8 (eight) hours as needed.    Marland Kitchen escitalopram (LEXAPRO) 10 MG tablet TAKE 1 TABLET AS NEEDED.  Marland Kitchen esomeprazole (NEXIUM) 40 MG capsule Take 1 capsule (40 mg total) by mouth 2 (two) times daily.  . ferrous sulfate 325 (65 FE) MG tablet TAKE 1 TABLET TWICE DAILY  WITH A MEAL  . ferrous sulfate 325 (65 FE) MG tablet TAKE 1 TABLET BY MOUTH EVERY DAY WITH A MEAL  . gabapentin (NEURONTIN) 300 MG capsule TAKE 1 CAPSULE BY MOUTH EVERY DAY  . gabapentin (NEURONTIN) 300 MG capsule TAKE 1 CAPSULE BY MOUTH EVERY DAY  . Insulin Syringe-Needle U-100 (INSULIN SYRINGE 1CC/30GX5/16") 30G X 5/16" 1 ML MISC USE FOUR TIMES DAILY  . ipratropium-albuterol (DUONEB) 0.5-2.5 (3) MG/3ML SOLN Take 3 mLs by nebulization every 4 (four) hours as needed.  . isosorbide mononitrate (IMDUR) 30 MG 24 hr tablet  TAKE 1 TABLET BY MOUTH EVERY DAY  . LANTUS 100 UNIT/ML injection Inject 25 Units into the skin 2 (two) times daily.  . mirtazapine (REMERON) 30 MG tablet TAKE 1 TABLET BY MOUTH EVERY NIGHT AT BEDTIME AT 9 PM  . nitroGLYCERIN (NITROSTAT) 0.4 MG SL tablet Place 1 tablet (0.4 mg total) under the tongue every 5 (five) minutes as needed.  . OxyCODONE HCl ER (OXYCONTIN) 60 MG T12A Take 1 tablet by mouth every 12 (twelve) hours as needed. For severe pain  . oxyCODONE-acetaminophen (PERCOCET/ROXICET) 5-325 MG per tablet Take 1 tablet by mouth every 6 (six) hours as needed.  . simvastatin (ZOCOR) 40 MG tablet TAKE 1 TABLET BY MOUTH EVERY NIGHT AT BEDTIME FOR CHOLESTEROL  . SPIRIVA HANDIHALER 18 MCG inhalation capsule INHALE CONTENTS OF 1 CAPSULE ONCE DAILY USING HANDIHALER AT 8AM  . torsemide (DEMADEX) 20 MG tablet Take 2 tablets (40 mg total) by mouth 2 (two) times daily.  . traZODone (DESYREL) 50 MG tablet Take 1 tablet (50 mg total) by mouth at bedtime.  . [DISCONTINUED] OxyCODONE HCl ER (OXYCONTIN) 60 MG T12A Take 1 tablet by mouth every 12 (twelve) hours as needed. For severe pain  . [DISCONTINUED] oxyCODONE-acetaminophen (PERCOCET/ROXICET) 5-325 MG per tablet Take 1 tablet by mouth every 6 (six) hours as needed.  . DULoxetine (CYMBALTA) 60 MG capsule Take 60 mg by mouth daily.   . furosemide (LASIX) 40 MG tablet Take by mouth. Takes 1 1/2 tablet daily.  . TDaP (BOOSTRIX) 5-2.5-18.5 LF-MCG/0.5 injection Inject 0.5 mLs into the muscle once.  . zoster vaccine live, PF, (ZOSTAVAX) 16109 UNT/0.65ML injection Inject 19,400 Units into the skin once.     Orders Placed This Encounter  Procedures  . Hemoglobin A1c    Return in about 3 months (around 04/02/2014) for follow up diabetes.

## 2014-01-01 LAB — CBC WITH DIFFERENTIAL/PLATELET
BASOS PCT: 0.5 % (ref 0.0–3.0)
Basophils Absolute: 0 10*3/uL (ref 0.0–0.1)
EOS ABS: 0.2 10*3/uL (ref 0.0–0.7)
Eosinophils Relative: 3.4 % (ref 0.0–5.0)
HEMATOCRIT: 38.5 % — AB (ref 39.0–52.0)
Hemoglobin: 12.2 g/dL — ABNORMAL LOW (ref 13.0–17.0)
Lymphocytes Relative: 26 % (ref 12.0–46.0)
Lymphs Abs: 1.8 10*3/uL (ref 0.7–4.0)
MCHC: 31.7 g/dL (ref 30.0–36.0)
MCV: 82.5 fl (ref 78.0–100.0)
Monocytes Absolute: 0.4 10*3/uL (ref 0.1–1.0)
Monocytes Relative: 5.5 % (ref 3.0–12.0)
NEUTROS PCT: 64.6 % (ref 43.0–77.0)
Neutro Abs: 4.5 10*3/uL (ref 1.4–7.7)
PLATELETS: 296 10*3/uL (ref 150.0–400.0)
RBC: 4.66 Mil/uL (ref 4.22–5.81)
RDW: 18.4 % — ABNORMAL HIGH (ref 11.5–14.6)
WBC: 7 10*3/uL (ref 4.5–10.5)

## 2014-01-01 LAB — HEMOGLOBIN A1C: Hgb A1c MFr Bld: 9.4 % — ABNORMAL HIGH (ref 4.6–6.5)

## 2014-01-01 LAB — COMPREHENSIVE METABOLIC PANEL
ALBUMIN: 2.8 g/dL — AB (ref 3.5–5.2)
ALT: 7 U/L (ref 0–53)
AST: 10 U/L (ref 0–37)
Alkaline Phosphatase: 107 U/L (ref 39–117)
BUN: 19 mg/dL (ref 6–23)
CO2: 31 mEq/L (ref 19–32)
Calcium: 8.6 mg/dL (ref 8.4–10.5)
Chloride: 100 mEq/L (ref 96–112)
Creatinine, Ser: 1.4 mg/dL (ref 0.4–1.5)
GFR: 51.84 mL/min — ABNORMAL LOW (ref >60.00)
Glucose, Bld: 226 mg/dL — ABNORMAL HIGH (ref 70–99)
POTASSIUM: 5 meq/L (ref 3.5–5.1)
Sodium: 139 mEq/L (ref 135–145)
Total Bilirubin: 0.5 mg/dL (ref 0.3–1.2)
Total Protein: 6.8 g/dL (ref 6.0–8.3)

## 2014-01-01 LAB — MICROALBUMIN / CREATININE URINE RATIO
CREATININE, U: 212.6 mg/dL
Microalb Creat Ratio: 130 mg/g — ABNORMAL HIGH (ref 0.0–30.0)

## 2014-01-01 NOTE — Assessment & Plan Note (Signed)
Multifactorial,  Venous insufficiency, vasodilating medicationsn and  CHF,  Compression is C/i secondary to PAD.  Elevated leg,  Torsemide continue pending eval of rena.l function ..Marland Kitchen

## 2014-01-01 NOTE — Assessment & Plan Note (Signed)
Has resumed smoking , 1 pack daily in spite of PAD and recent amputation.

## 2014-01-01 NOTE — Assessment & Plan Note (Signed)
Well controlled on current regimen. Renal function stable, no changes today.  Lab Results  Component Value Date   CREATININE 1.25 10/25/2013

## 2014-01-01 NOTE — Assessment & Plan Note (Signed)
S[facility]. Prosthesis made.  Cholesterol is under control but his history of medication and lifestyle noncompliance with regard to tobacco abuse and uncontrolled DM were the major contributors. He is smoking again.  Urged to quit

## 2014-01-02 ENCOUNTER — Encounter: Payer: Self-pay | Admitting: Internal Medicine

## 2014-01-08 ENCOUNTER — Other Ambulatory Visit: Payer: Self-pay | Admitting: Internal Medicine

## 2014-01-10 ENCOUNTER — Other Ambulatory Visit: Payer: Self-pay | Admitting: *Deleted

## 2014-01-10 NOTE — Telephone Encounter (Signed)
Ok to fill 

## 2014-01-11 ENCOUNTER — Telehealth: Payer: Self-pay

## 2014-01-11 ENCOUNTER — Other Ambulatory Visit: Payer: Self-pay | Admitting: Internal Medicine

## 2014-01-11 MED ORDER — TRAZODONE HCL 50 MG PO TABS: 50.0000 mg | ORAL_TABLET | Freq: Every day | ORAL | Status: DC

## 2014-01-11 NOTE — Telephone Encounter (Signed)
Relevant patient education assigned to patient using Emmi. ° °

## 2014-01-15 ENCOUNTER — Other Ambulatory Visit: Payer: Self-pay | Admitting: Internal Medicine

## 2014-01-17 ENCOUNTER — Other Ambulatory Visit: Payer: Self-pay | Admitting: Internal Medicine

## 2014-01-21 ENCOUNTER — Other Ambulatory Visit: Payer: Self-pay | Admitting: Internal Medicine

## 2014-01-23 ENCOUNTER — Telehealth: Payer: Self-pay | Admitting: Internal Medicine

## 2014-01-23 NOTE — Telephone Encounter (Signed)
Left vm to f/u on rx sent.  Call with questions or concerns [phone].  No further details left on vm

## 2014-01-24 ENCOUNTER — Other Ambulatory Visit: Payer: Self-pay | Admitting: *Deleted

## 2014-01-24 MED ORDER — SIMVASTATIN 40 MG PO TABS: ORAL_TABLET | ORAL | Status: DC

## 2014-01-24 NOTE — Telephone Encounter (Signed)
Patient came in on 04/14/12

## 2014-01-24 NOTE — Telephone Encounter (Signed)
Called patient Patrick [name] and they are unaware at this time of company trying to obtain information Patrick [name] will all back if something is needed.

## 2014-02-01 ENCOUNTER — Other Ambulatory Visit: Payer: Self-pay | Admitting: Internal Medicine

## 2014-02-01 DIAGNOSIS — M48062 Spinal stenosis, lumbar region with neurogenic claudication: Secondary | ICD-10-CM

## 2014-02-01 NOTE — Telephone Encounter (Signed)
Last visit 12/31/13

## 2014-02-03 MED ORDER — OXYCODONE HCL ER 60 MG PO T12A: 1.0000 | EXTENDED_RELEASE_TABLET | Freq: Two times a day (BID) | ORAL | Status: DC | PRN

## 2014-02-03 MED ORDER — OXYCODONE-ACETAMINOPHEN 5-325 MG PO TABS: 1.0000 | ORAL_TABLET | Freq: Four times a day (QID) | ORAL | Status: DC | PRN

## 2014-02-04 NOTE — Telephone Encounter (Signed)
Patient notified scripts placed up front for pick up. 

## 2014-02-07 ENCOUNTER — Other Ambulatory Visit: Payer: Self-pay | Admitting: Internal Medicine

## 2014-02-13 ENCOUNTER — Other Ambulatory Visit: Payer: Self-pay | Admitting: Internal Medicine

## 2014-02-18 ENCOUNTER — Telehealth: Payer: Self-pay | Admitting: *Deleted

## 2014-02-18 ENCOUNTER — Inpatient Hospital Stay: Payer: Self-pay | Admitting: Internal Medicine

## 2014-02-18 LAB — BASIC METABOLIC PANEL
Anion Gap: 1 — ABNORMAL LOW (ref 7–16)
BUN: 28 mg/dL — ABNORMAL HIGH (ref 7–18)
Calcium, Total: 8.6 mg/dL (ref 8.5–10.1)
Chloride: 102 mmol/L (ref 98–107)
Co2: 38 mmol/L — ABNORMAL HIGH (ref 21–32)
Creatinine: 1.54 mg/dL — ABNORMAL HIGH (ref 0.60–1.30)
EGFR (African American): 52 — ABNORMAL LOW
GFR CALC NON AF AMER: 45 — AB
Glucose: 136 mg/dL — ABNORMAL HIGH (ref 65–99)
Osmolality: 289 (ref 275–301)
POTASSIUM: 3.9 mmol/L (ref 3.5–5.1)
Sodium: 141 mmol/L (ref 136–145)

## 2014-02-18 LAB — CBC
HCT: 40.5 % (ref 40.0–52.0)
HGB: 12.4 g/dL — ABNORMAL LOW (ref 13.0–18.0)
MCH: 25.8 pg — ABNORMAL LOW (ref 26.0–34.0)
MCHC: 30.5 g/dL — AB (ref 32.0–36.0)
MCV: 85 fL (ref 80–100)
PLATELETS: 318 10*3/uL (ref 150–440)
RBC: 4.79 10*6/uL (ref 4.40–5.90)
RDW: 18 % — AB (ref 11.5–14.5)
WBC: 5.6 10*3/uL (ref 3.8–10.6)

## 2014-02-18 NOTE — Telephone Encounter (Signed)
Patient Information:  Caller Name: Ines BloomerShawn  Phone: [phone]  Patient: Patrick [name], Patrick [name]  Gender: Male  DOB: 10-16-1940  Age: 73 Years  PCP: Duncan Dullullo, Teresa (Adults only)  Office Follow Up:  Does the office need to follow up with this patient?: No  Instructions For The Office: N/A   Symptoms  Reason For Call & Symptoms: 02/13/14 pt has a couple of scrapes on right leg from wheelchair, 1 inch in size.  02/18/14 scrapes have some redness around and a few blisters, dime size, some have popped and drained clear fluid.   Afebrile.   Daughter is concerned about infection due to redness.   Daughter has been using cream from when pts other leg had infection.  Spoke with Olegario MessierKathy, RN with Dr Darrick Huntsmanullo, regarding sxs and ER disposition, no appt available, advised to send to ER in the case IV abx are needed.  Advised daughter of recommendations, she states she will [facility] if [facility] denies, she will get him to a doctor somewhere/UC.  Reviewed Health History In EMR: Yes  Reviewed Medications In EMR: Yes  Reviewed Allergies In EMR: Yes  Reviewed Surgeries / Procedures: Yes  Date of Onset of Symptoms: 02/13/2014  Guideline(s) Used:  Wound Infection  Disposition Per Guideline:   Go to ED Now (or to Office with PCP Approval)  Reason For Disposition Reached:   Black (necrotic) or blisters develop in wound  Advice Given:  N/A  RN Overrode Recommendation:  Go To ED  Per Olegario MessierKathy, RN

## 2014-02-18 NOTE — Telephone Encounter (Signed)
Patient is taking advice for care.

## 2014-02-19 LAB — BASIC METABOLIC PANEL
Anion Gap: 2 — ABNORMAL LOW (ref 7–16)
BUN: 28 mg/dL — ABNORMAL HIGH (ref 7–18)
CHLORIDE: 102 mmol/L (ref 98–107)
Calcium, Total: 8.2 mg/dL — ABNORMAL LOW (ref 8.5–10.1)
Co2: 36 mmol/L — ABNORMAL HIGH (ref 21–32)
Creatinine: 1.67 mg/dL — ABNORMAL HIGH (ref 0.60–1.30)
EGFR (African American): 47 — ABNORMAL LOW
EGFR (Non-African Amer.): 41 — ABNORMAL LOW
Glucose: 340 mg/dL — ABNORMAL HIGH (ref 65–99)
OSMOLALITY: 298 (ref 275–301)
POTASSIUM: 3.9 mmol/L (ref 3.5–5.1)
Sodium: 140 mmol/L (ref 136–145)

## 2014-02-19 LAB — CBC WITH DIFFERENTIAL/PLATELET
Basophil #: 0.1 10*3/uL (ref 0.0–0.1)
Basophil %: 0.9 %
EOS ABS: 0.2 10*3/uL (ref 0.0–0.7)
Eosinophil %: 3.8 %
HCT: 39 % — ABNORMAL LOW (ref 40.0–52.0)
HGB: 11.8 g/dL — AB (ref 13.0–18.0)
LYMPHS PCT: 25 %
Lymphocyte #: 1.4 10*3/uL (ref 1.0–3.6)
MCH: 25.9 pg — ABNORMAL LOW (ref 26.0–34.0)
MCHC: 30.2 g/dL — ABNORMAL LOW (ref 32.0–36.0)
MCV: 86 fL (ref 80–100)
MONOS PCT: 8.8 %
Monocyte #: 0.5 x10 3/mm (ref 0.2–1.0)
NEUTROS PCT: 61.5 %
Neutrophil #: 3.4 10*3/uL (ref 1.4–6.5)
PLATELETS: 306 10*3/uL (ref 150–440)
RBC: 4.54 10*6/uL (ref 4.40–5.90)
RDW: 18 % — AB (ref 11.5–14.5)
WBC: 5.5 10*3/uL (ref 3.8–10.6)

## 2014-02-19 LAB — HEMOGLOBIN A1C: Hemoglobin A1C: 8.7 % — ABNORMAL HIGH (ref 4.2–6.3)

## 2014-02-22 LAB — WOUND AEROBIC CULTURE

## 2014-02-23 LAB — CULTURE, BLOOD (SINGLE)

## 2014-02-27 ENCOUNTER — Ambulatory Visit (INDEPENDENT_AMBULATORY_CARE_PROVIDER_SITE_OTHER): Payer: Medicare Other | Admitting: Internal Medicine

## 2014-02-27 ENCOUNTER — Encounter: Payer: Self-pay | Admitting: Internal Medicine

## 2014-02-27 ENCOUNTER — Ambulatory Visit: Payer: Medicare Other | Admitting: Internal Medicine

## 2014-02-27 VITALS — BP 118/52 | HR 57 | Temp 97.9°F | Resp 16 | Ht 70.0 in | Wt 167.0 lb

## 2014-02-27 DIAGNOSIS — E1151 Type 2 diabetes mellitus with diabetic peripheral angiopathy without gangrene: Secondary | ICD-10-CM

## 2014-02-27 DIAGNOSIS — I7025 Atherosclerosis of native arteries of other extremities with ulceration: Secondary | ICD-10-CM | POA: Insufficient documentation

## 2014-02-27 DIAGNOSIS — M48062 Spinal stenosis, lumbar region with neurogenic claudication: Secondary | ICD-10-CM

## 2014-02-27 DIAGNOSIS — L98499 Non-pressure chronic ulcer of skin of other sites with unspecified severity: Principal | ICD-10-CM

## 2014-02-27 DIAGNOSIS — E1159 Type 2 diabetes mellitus with other circulatory complications: Secondary | ICD-10-CM

## 2014-02-27 DIAGNOSIS — I739 Peripheral vascular disease, unspecified: Secondary | ICD-10-CM

## 2014-02-27 DIAGNOSIS — I25810 Atherosclerosis of coronary artery bypass graft(s) without angina pectoris: Secondary | ICD-10-CM

## 2014-02-27 MED ORDER — OXYCODONE-ACETAMINOPHEN 5-325 MG PO TABS: 1.0000 | ORAL_TABLET | Freq: Four times a day (QID) | ORAL | Status: DC | PRN

## 2014-02-27 MED ORDER — INSULIN GLARGINE 100 UNIT/ML ~~LOC~~ SOLN: SUBCUTANEOUS | Status: DC

## 2014-02-27 MED ORDER — LEVOFLOXACIN 500 MG PO TABS: 500.0000 mg | ORAL_TABLET | Freq: Every day | ORAL | Status: DC

## 2014-02-27 MED ORDER — OXYCODONE HCL ER 60 MG PO T12A: 1.0000 | EXTENDED_RELEASE_TABLET | Freq: Two times a day (BID) | ORAL | Status: DC | PRN

## 2014-02-27 NOTE — Progress Notes (Signed)
Pre-visit discussion using our clinic review tool. No additional management support is needed unless otherwise documented below in the visit note.  

## 2014-02-27 NOTE — Assessment & Plan Note (Signed)
S/p Left BKA done at [facility] August 2014. Prosthesis made.  Cholesterol is under control but his history of medication and lifestyle noncompliance with regard to tobacco abuse and uncontrolled DM are major contributors.

## 2014-02-27 NOTE — Progress Notes (Signed)
Patient ID: Patrick [name], male   DOB: 1941/07/29, 73 y.o.   MRN: [id number]   Patient Active Problem List   Diagnosis Date Noted  . Atherosclerosis of native arteries of the extremities with ulceration(440.23) 02/27/2014  . DJD (degenerative joint disease), lumbar 12/06/2013  . PAD (peripheral artery disease) 12/06/2013  . Acute on chronic systolic CHF (congestive heart failure), NYHA class 3 10/26/2013  . Dark stools 10/26/2013  . COPD (chronic obstructive pulmonary disease) 10/26/2013  . Wheezing 10/02/2013  . Leg edema, right 10/02/2013  . Hematochezia 07/10/2013  . Gastritis 07/10/2013  . Hypotension, unspecified 02/23/2013  . COPD exacerbation 12/22/2012  . Bronchitis with chronic airway obstruction 10/07/2012  . Depression with anxiety 05/11/2012  . Hyperkalemia 04/16/2012  . DM type 2 causing CKD stage 3 04/16/2012  . Tobacco abuse counseling 02/14/2012  . Anemia 12/14/2011  . Cardiomyopathy, ischemic 12/14/2011  . DM (diabetes mellitus), type 2 with peripheral vascular complications   . Anxiety   . Peripheral vascular disease   . TOBACCO ABUSE 07/10/2010  . HYPERTENSION, BENIGN 07/10/2010  . CAD, AUTOLOGOUS BYPASS GRAFT 07/10/2010  . HYPERLIPIDEMIA-MIXED 07/09/2010  . EDEMA 07/09/2010    Subjective:  CC:   Chief Complaint  Patient presents with  . Follow-up    cellulitis right leg.thrre wounds to right leg and area is hot to touch.yellow drainage.    HPI:   Patrick [name] is a 73 y.o. male who presents for evaluation of Right leg wound complicated by cellulitis. Problem started with open wounds to lower leg caused by wheelchair trauma which occurred about two to three weeks ago.  After a few days leg  Became red..    Was evaluated in ER on May 18 and admitted overnight for  Wound and blood cultures,  Treated with  IV abx,  Sent home on Levaquin 500 mg .  Cultures were done which were available on [facility] .  Staph Aureus pan sensitive.   He has taken 7 days of  levaquin but did not start covering the wound until yesterday He has been  showering. The redness surrounding the wound has receded from the proximal tibia to 3 inches below the patella.  However the wounds started looking purulent yesterday after he started covering the wound .   Blood sugars have been elevated, and he has not returned for follow up on his uncontrolled. Diabetes.     Past Medical History  Diagnosis Date  . Hypertension   . Hyperlipidemia   . Coronary artery disease     lbbb  . PAD (peripheral artery disease)   . Diabetes mellitus     poorly controlled  . Anxiety   . Tobacco abuse   . Peripheral vascular disease     with prior stents placed bilaterally  . MI (myocardial infarction) 2000  . Anemia   . COPD exacerbation   . Acute renal failure     due to ATN versus hypovolemia  . Acute exacerbation of CHF (congestive heart failure)     EF 25%,  . COPD (chronic obstructive pulmonary disease)     Past Surgical History  Procedure Laterality Date  . Coronary artery bypass graft      x4  . Stents in legs  03/2010    [facility]  . Cholecystectomy    . Cardiac catheterization  2010    [facility]  . Cataract extraction    . Cataract extraction, bilateral  2013  . Iliac artery stent    .  External iliac      stent, left        The following portions of the patient's history were reviewed and updated as appropriate: Allergies, current medications, and problem list.    Review of Systems:   Patient denies headache, fevers, malaise, unintentional weight loss, skin rash, eye pain, sinus congestion and sinus pain, sore throat, dysphagia,  hemoptysis , cough, dyspnea, wheezing, chest pain, palpitations, orthopnea, edema, abdominal pain, nausea, melena, diarrhea, constipation, flank pain, dysuria, hematuria, urinary  Frequency, nocturia, numbness, tingling, seizures,  Focal weakness, Loss of consciousness,  Tremor, insomnia, depression, anxiety, and  suicidal ideation.     History   Social History  . Marital Status: Single    Spouse Name: N/A    Number of Children: N/A  . Years of Education: N/A   Occupational History  . Not on file.   Social History Main Topics  . Smoking status: Current Every Day Smoker -- 0.50 packs/day for 30 years    Types: Cigarettes    Last Attempt to Quit: 03/19/2013  . Smokeless tobacco: Never Used     Comment: RECENTLY STOPPED  . Alcohol Use: No  . Drug Use: No  . Sexual Activity: Not on file   Other Topics Concern  . Not on file   Social History Narrative  . No narrative on file    Objective:  Filed Vitals:   02/27/14 1447  BP: 118/52  Pulse: 57  Temp: 97.9 F (36.6 C)  Resp: 16     General appearance: alert, cooperative and appears stated age Ears: normal TM's and external ear canals both ears Throat: lips, mucosa, and tongue normal; teeth and gums normal Neck: no adenopathy, no carotid bruit, supple, symmetrical, trachea midline and thyroid not enlarged, symmetric, no tenderness/mass/nodules Back: symmetric, no curvature. ROM normal. No CVA tenderness. Lungs: clear to auscultation bilaterally Heart: regular rate and rhythm, S1, S2 normal, no murmur, click, rub or gallop Abdomen: soft, non-tender; bowel sounds normal; no masses,  no organomegaly Pulses: nonpalpable right DP pulse,  Left BKA Skin: blanching erythema on righy lower extremity,  Three shallow ulcers with exuberant purulent base.  Brawny skin changes to lower leg.  Lymph nodes: Cervical, supraclavicular, and axillary nodes normal.  Assessment and Plan:  Atherosclerosis of native arteries of the extremities with ulceration(440.23) Continue Levaquin for 7 more days Wound care referral in process  Dressing changes daily ,  Clean wounds with with sterile saline,  Not tap water Return  to Vascular surgery ASAP ELEVATE leg   PAD (peripheral artery disease) S[facility]. Prosthesis made.   Cholesterol is under control but his history of medication and lifestyle noncompliance with regard to tobacco abuse and uncontrolled DM are major contributors.     DM (diabetes mellitus), type 2 with peripheral vascular complications The only time his diabetes has approached control was when he was in rehab for 6 weeks.  Patient adviised  to increase lantus to 35 units bid and return in one wek  A total of 40 minutes was spent with patient more than half of which was spent in counseling, reviewing records from other prviders and coordination of care.  Updated Medication List Outpatient Encounter Prescriptions as of 02/27/2014  Medication Sig  . aspirin 81 MG tablet Take 81 mg by mouth daily.  . B-D UF III MINI PEN NEEDLES 31G X 5 MM MISC USE TWICE DAILY AS DIRECTED  . carvedilol (COREG) 12.5 MG tablet TAKE 1  TABLET BY MOUTH TWICE DAILY WITH A MEAL  . dextromethorphan-guaiFENesin (MUCINEX DM) 30-600 MG per 12 hr tablet Take 1 tablet by mouth 2 (two) times daily.  Marland Kitchen dimenhyDRINATE (DRAMAMINE) 50 MG tablet Take 50 mg by mouth every 8 (eight) hours as needed.    . DULoxetine (CYMBALTA) 60 MG capsule Take 60 mg by mouth daily.   Marland Kitchen escitalopram (LEXAPRO) 10 MG tablet TAKE 1 TABLET AS NEEDED.  Marland Kitchen esomeprazole (NEXIUM) 40 MG capsule Take 1 capsule (40 mg total) by mouth 2 (two) times daily.  . ferrous sulfate 325 (65 FE) MG tablet TAKE 1 TABLET TWICE DAILY  WITH A MEAL  . ferrous sulfate 325 (65 FE) MG tablet TAKE 1 TABLET BY MOUTH EVERY DAY WITH A MEAL  . furosemide (LASIX) 40 MG tablet Take by mouth. Takes 1 1/2 tablet daily.  Marland Kitchen gabapentin (NEURONTIN) 300 MG capsule TAKE 1 CAPSULE BY MOUTH EVERY DAY  . insulin glargine (LANTUS) 100 UNIT/ML injection INJECT 35 units twice daily  . Insulin Syringe-Needle U-100 (INSULIN SYRINGE 1CC/30GX5/16") 30G X 5/16" 1 ML MISC Use as directed  . ipratropium-albuterol (DUONEB) 0.5-2.5 (3) MG/3ML SOLN Take 3 mLs by nebulization every 4 (four) hours as needed.  .  isosorbide mononitrate (IMDUR) 30 MG 24 hr tablet TAKE 1 TABLET BY MOUTH EVERY DAY  . levofloxacin (LEVAQUIN) 500 MG tablet Take 1 tablet (500 mg total) by mouth daily.  . mirtazapine (REMERON) 30 MG tablet TAKE 1 TABLET BY MOUTH EVERY NIGHT AT BEDTIME AT 9PM.  . nitroGLYCERIN (NITROSTAT) 0.4 MG SL tablet Place 1 tablet (0.4 mg total) under the tongue every 5 (five) minutes as needed.  . OxyCODONE HCl ER (OXYCONTIN) 60 MG T12A Take 1 tablet by mouth every 12 (twelve) hours as needed. For severe pain  . oxyCODONE-acetaminophen (PERCOCET/ROXICET) 5-325 MG per tablet Take 1 tablet by mouth every 6 (six) hours as needed.  . simvastatin (ZOCOR) 40 MG tablet TAKE 1 TABLET BY MOUTH EVERY NIGHT AT BEDTIME FOR CHOLESTEROL  . SPIRIVA HANDIHALER 18 MCG inhalation capsule INHALE CONTENTS OF 1 CAPSULE ONCE DAILY USING HANDIHALER AT 8AM  . TDaP (BOOSTRIX) 5-2.5-18.5 LF-MCG/0.5 injection Inject 0.5 mLs into the muscle once.  . torsemide (DEMADEX) 20 MG tablet Take 2 tablets (40 mg total) by mouth 2 (two) times daily.  . traZODone (DESYREL) 50 MG tablet TAKE 1 TABLET BY MOUTH AT BEDTIME  . zoster vaccine live, PF, (ZOSTAVAX) 40981 UNT/0.65ML injection Inject 19,400 Units into the skin once.  . [DISCONTINUED] LANTUS 100 UNIT/ML injection INJECT 25 UNITS UNDER THE SKIN AT AT BEDTIME AT 9PM  . [DISCONTINUED] OxyCODONE HCl ER (OXYCONTIN) 60 MG T12A Take 1 tablet by mouth every 12 (twelve) hours as needed. For severe pain  . [DISCONTINUED] oxyCODONE-acetaminophen (PERCOCET/ROXICET) 5-325 MG per tablet Take 1 tablet by mouth every 6 (six) hours as needed.     Orders Placed This Encounter  Procedures  . Ambulatory referral to Pain Clinic    No Follow-up on file.

## 2014-02-27 NOTE — Patient Instructions (Addendum)
Your wounds,  If not healed,  Can lead to a bone infection on the right and eventual loss of limb cue ot yoru poor circulation and uncontrolled diabetes   Continue Levaquin for 7 more days Wound care referral in process  Dressing changes daily ,  Clean wounds with with sterile saline,  Not tap water Return  to Vascular surgery ASAP ELEVATE leg    Increase your Lantus by 5 units twice daily  And RETURN IN ONE WEEK WITH A LOG OF YOUR BLOOD SUGARS FOR THE PAST WEEK

## 2014-02-27 NOTE — Assessment & Plan Note (Addendum)
The only time his diabetes has approached control was when he was in rehab for 6 weeks.  Patient adviised  to increase lantus to 35 units bid and return in one wek

## 2014-02-27 NOTE — Assessment & Plan Note (Signed)
Continue Levaquin for 7 more days Wound care referral in process  Dressing changes daily ,  Clean wounds with with sterile saline,  Not tap water Return  to Vascular surgery ASAP ELEVATE leg

## 2014-02-28 ENCOUNTER — Telehealth: Payer: Self-pay | Admitting: Internal Medicine

## 2014-02-28 NOTE — Telephone Encounter (Signed)
I do not order knee braces for patients.,  Has to see Ortho for that form  Is back in your red folder

## 2014-02-28 NOTE — Telephone Encounter (Signed)
Patient aware.

## 2014-03-01 ENCOUNTER — Encounter: Payer: Self-pay | Admitting: Surgery

## 2014-03-07 ENCOUNTER — Telehealth: Payer: Self-pay | Admitting: Internal Medicine

## 2014-03-07 NOTE — Telephone Encounter (Signed)
Patient was given an appointment for 03/12/14 however this morning they called back and stated they needed a later time than what he was scheduled. I don't know where to put patient since he needs 30 min appointment. Please advise. He has been taken off the schedule and the appointment is no longer available.

## 2014-03-08 ENCOUNTER — Ambulatory Visit: Payer: Medicare Other | Admitting: Internal Medicine

## 2014-03-08 IMAGING — XA IR VASCULAR PROCEDURE
13 of 16 series · 15 of 24 positions shown · IV contrast (IODINE)
Comparison: none

[Series 2: care aorta · 1 of 2 slices shown]
[im 1/2]
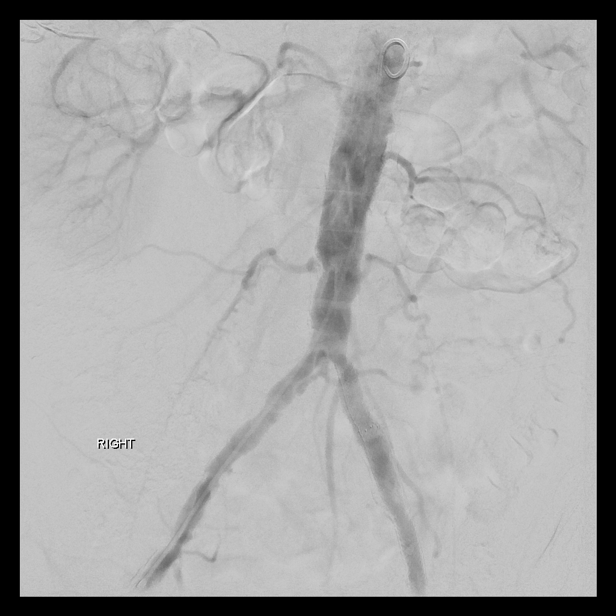

[Series 4: care sfa · 1 of 2 slices shown (1 of 12)]
[im 1/2]
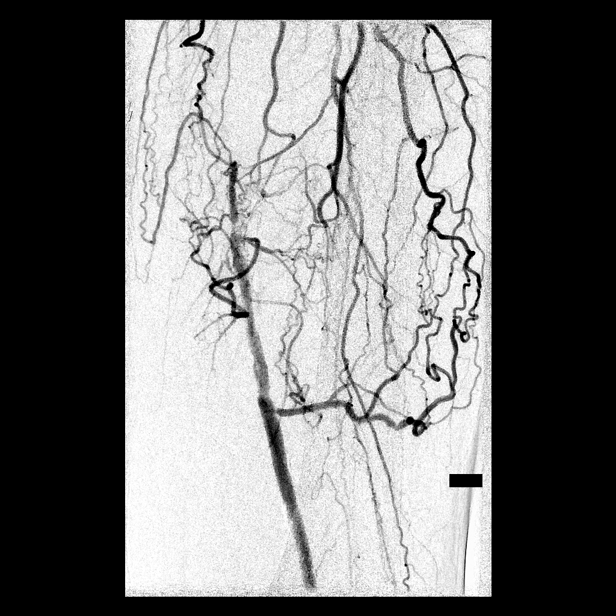

[Series 6: care sfa · 1 of 2 slices shown (2 of 12)]
[im 1/2]
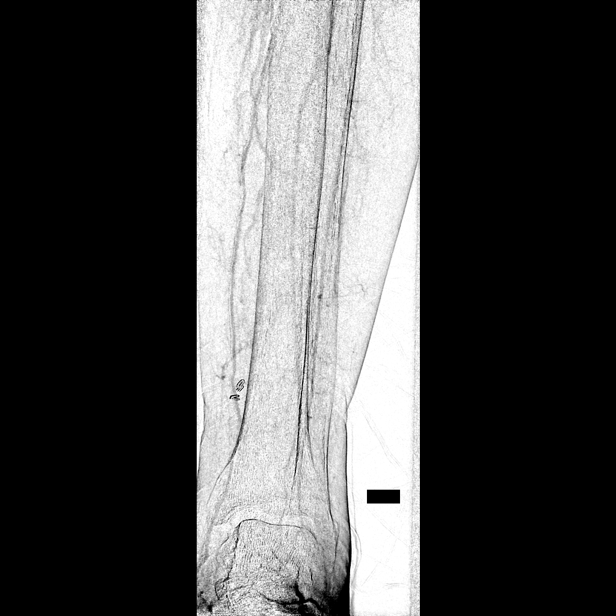

[Series 7: care sfa · 1 of 2 slices shown (3 of 12)]
[im 1/2]
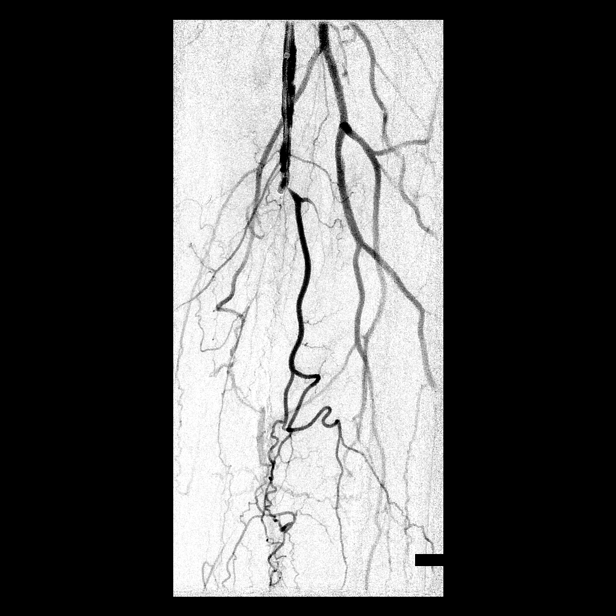

[Series 9: care sfa · 2 of 2 slices shown (4 of 12)]
[im 1/2]
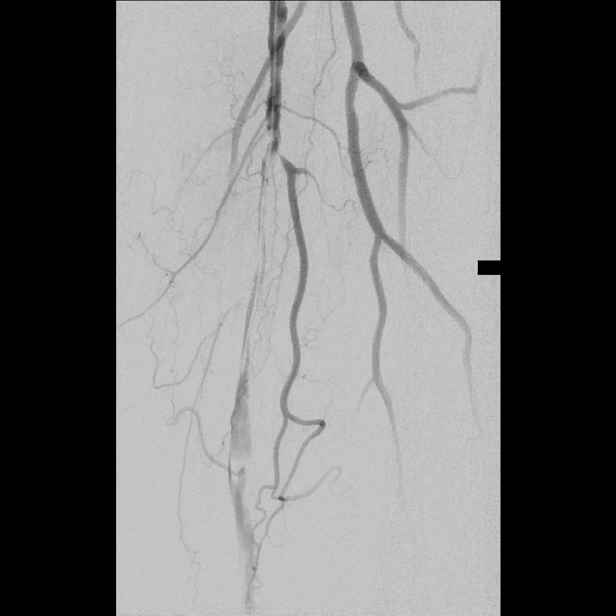
[im 2/2]
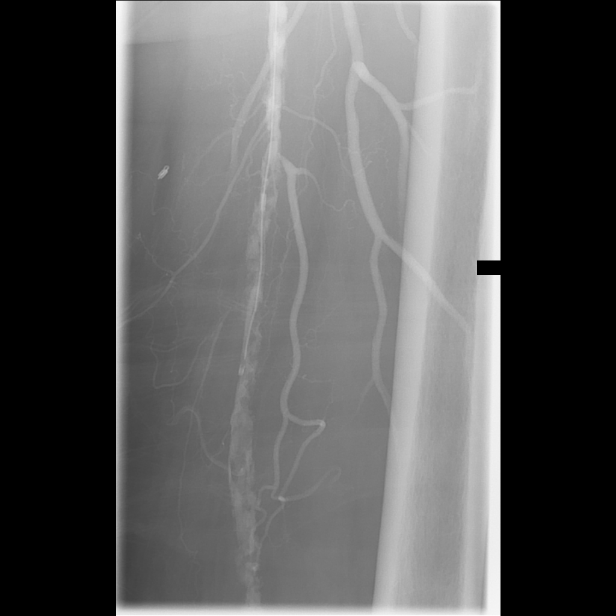

[Series 10: care sfa · 1 of 2 slices shown (5 of 12)]
[im 2/2]
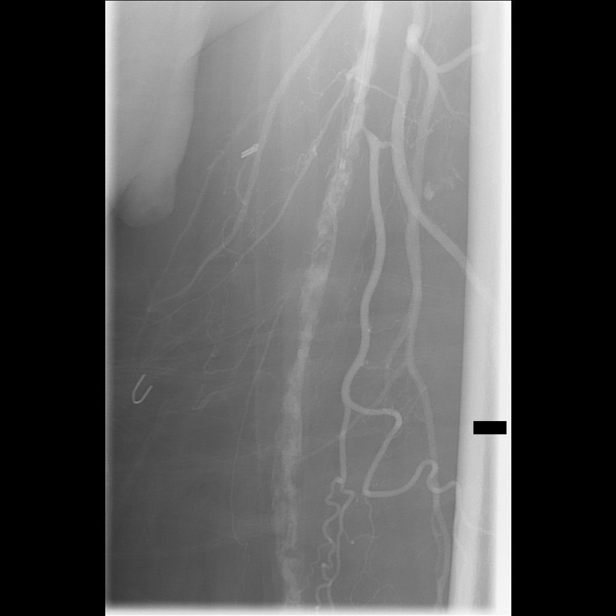

[Series 11: care sfa · 1 of 2 slices shown (6 of 12)]
[im 2/2]
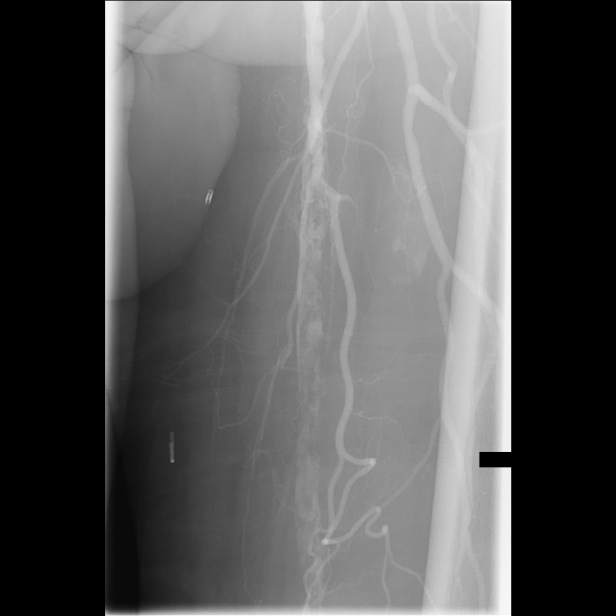

[Series 12: care sfa · 1 of 2 slices shown (7 of 12)]
[im 1/2]
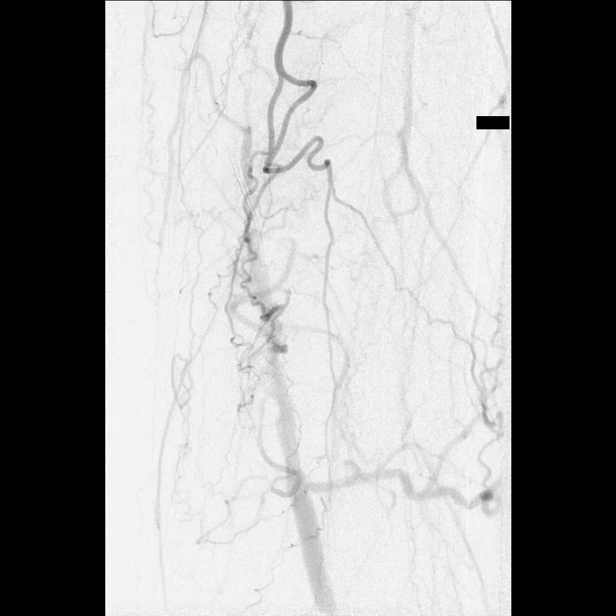

[Series 13: care sfa · 1 of 1 slices shown (8 of 12)]
[im 1/1]
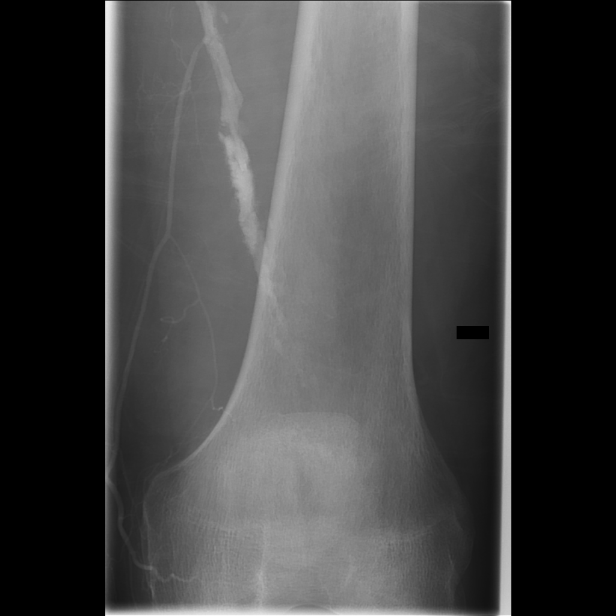

[Series 14: care sfa · 1 of 2 slices shown (9 of 12)]
[im 1/2]
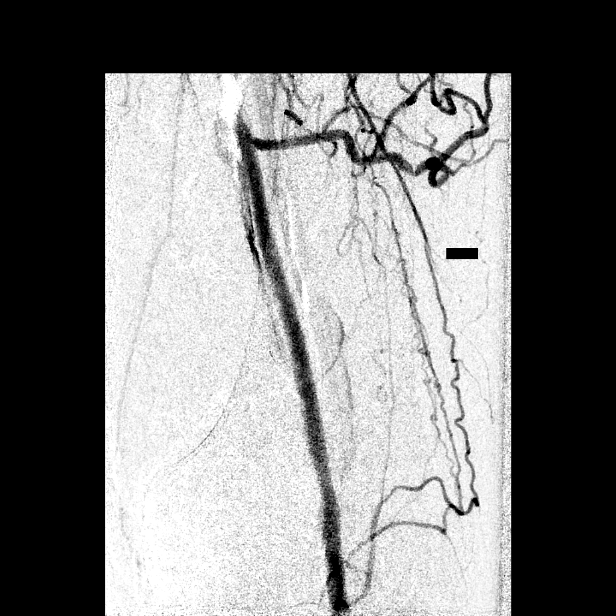

[Series 15: care sfa · 1 of 2 slices shown (10 of 12)]
[im 1/2]
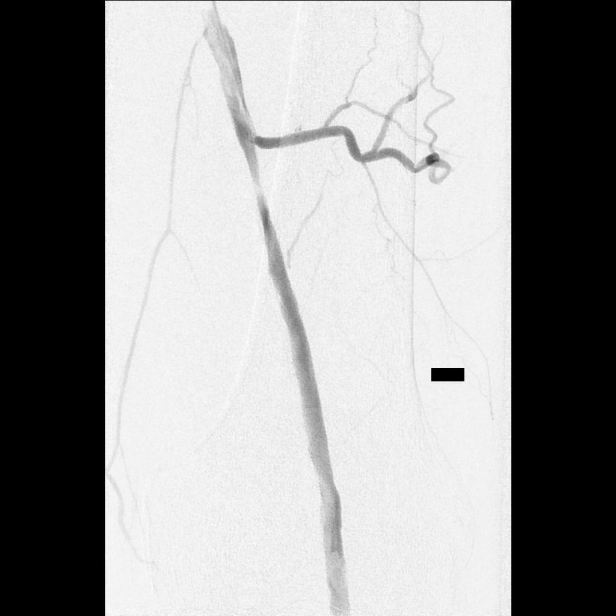

[Series 16: care sfa · 2 of 2 slices shown (11 of 12)]
[im 1/2]
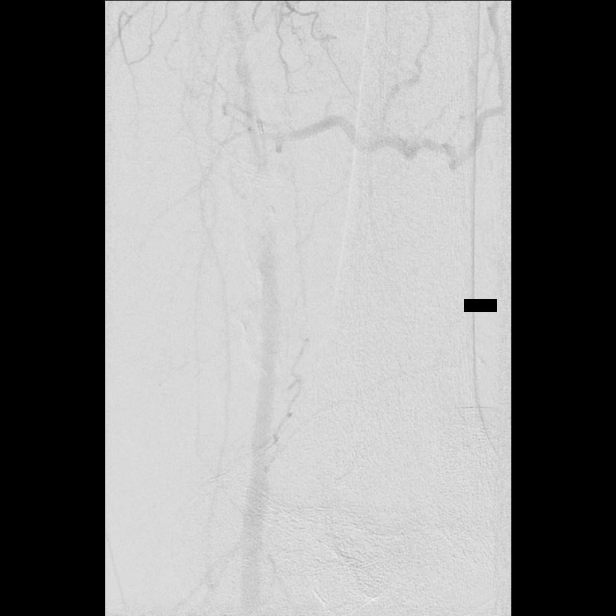
[im 2/2]
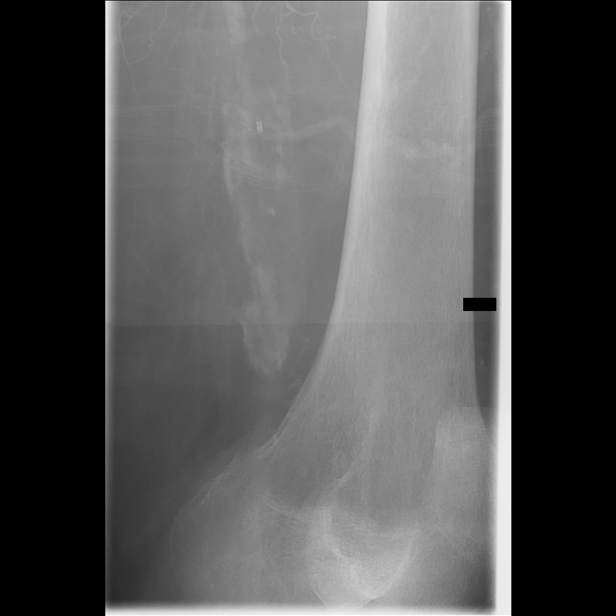

[Series 17: care sfa · 1 of 2 slices shown (12 of 12)]
[im 2/2]
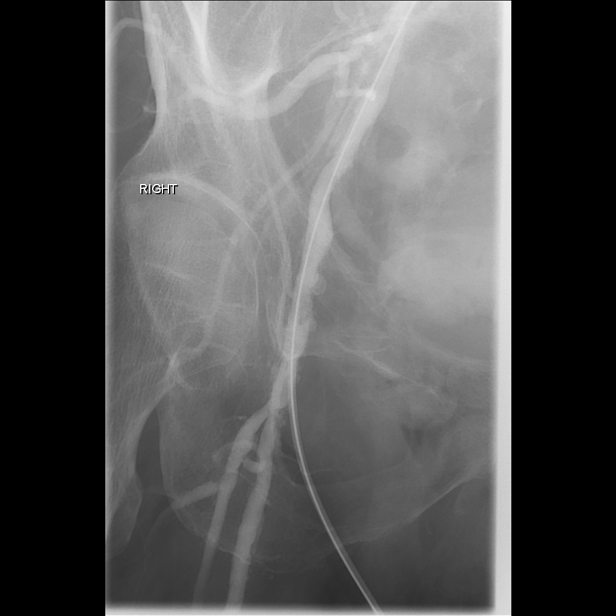

[15 of 24 positions shown; findings below may reference images not displayed]

IMAGES IMPORTED FROM THE SYNGO WORKFLOW SYSTEM
NO DICTATION FOR STUDY

## 2014-03-08 NOTE — Telephone Encounter (Signed)
You can put him at 12:30 on Monday June 8th.  I have already authorized betty Noss for 12;00 on June 8th.  So those two slots need to be unblocked to shceulde Noss and Stephfon,  But after that I HAVE  To leave for [facility] in [place].

## 2014-03-08 NOTE — Telephone Encounter (Signed)
I have notified patient of appointment can you unblock so I can schedule?

## 2014-03-08 NOTE — Telephone Encounter (Signed)
done

## 2014-03-08 NOTE — Telephone Encounter (Signed)
Only spot I see is Monday the 15 Th an acute spot at 5.00 PM  Is ok to use?

## 2014-03-11 ENCOUNTER — Ambulatory Visit (INDEPENDENT_AMBULATORY_CARE_PROVIDER_SITE_OTHER): Payer: Medicare Other | Admitting: Internal Medicine

## 2014-03-11 ENCOUNTER — Telehealth: Payer: Self-pay | Admitting: *Deleted

## 2014-03-11 ENCOUNTER — Encounter: Payer: Self-pay | Admitting: Internal Medicine

## 2014-03-11 VITALS — BP 126/58 | HR 95 | Temp 98.3°F | Resp 16 | Ht 70.0 in | Wt 164.8 lb

## 2014-03-11 DIAGNOSIS — L98499 Non-pressure chronic ulcer of skin of other sites with unspecified severity: Secondary | ICD-10-CM

## 2014-03-11 DIAGNOSIS — I739 Peripheral vascular disease, unspecified: Secondary | ICD-10-CM

## 2014-03-11 DIAGNOSIS — L8990 Pressure ulcer of unspecified site, unspecified stage: Secondary | ICD-10-CM

## 2014-03-11 DIAGNOSIS — E1159 Type 2 diabetes mellitus with other circulatory complications: Secondary | ICD-10-CM

## 2014-03-11 DIAGNOSIS — L8993 Pressure ulcer of unspecified site, stage 3: Secondary | ICD-10-CM

## 2014-03-11 DIAGNOSIS — I25810 Atherosclerosis of coronary artery bypass graft(s) without angina pectoris: Secondary | ICD-10-CM

## 2014-03-11 MED ORDER — DOXYCYCLINE HYCLATE 100 MG PO CAPS: 100.0000 mg | ORAL_CAPSULE | Freq: Two times a day (BID) | ORAL | Status: DC

## 2014-03-11 NOTE — Progress Notes (Signed)
Patient ID: Patrick CloudBoyd Sefcik, male   DOB: [birth year], 73 y.o.   MRN: [id number]  Patient Active Problem List   Diagnosis Date Noted  . Atherosclerosis of native arteries of the extremities with ulceration(440.23) 02/27/2014  . DJD (degenerative joint disease), lumbar 12/06/2013  . PAD (peripheral artery disease) 12/06/2013  . Acute on chronic systolic CHF (congestive heart failure), NYHA class 3 10/26/2013  . Dark stools 10/26/2013  . COPD (chronic obstructive pulmonary disease) 10/26/2013  . Wheezing 10/02/2013  . Leg edema, right 10/02/2013  . Hematochezia 07/10/2013  . Gastritis 07/10/2013  . Hypotension, unspecified 02/23/2013  . COPD exacerbation 12/22/2012  . Bronchitis with chronic airway obstruction 10/07/2012  . Depression with anxiety 05/11/2012  . Hyperkalemia 04/16/2012  . DM type 2 causing CKD stage 3 04/16/2012  . Tobacco abuse counseling 02/14/2012  . Anemia 12/14/2011  . Cardiomyopathy, ischemic 12/14/2011  . Type II or unspecified type diabetes mellitus with peripheral circulatory disorders, uncontrolled(250.72)   . Anxiety   . Peripheral vascular disease   . TOBACCO ABUSE 07/10/2010  . HYPERTENSION, BENIGN 07/10/2010  . CAD, AUTOLOGOUS BYPASS GRAFT 07/10/2010  . HYPERLIPIDEMIA-MIXED 07/09/2010  . EDEMA 07/09/2010    Subjective:  CC:   Chief Complaint  Patient presents with  . Follow-up    1 week follow up DM    HPI:   Patrick [name] is a 73 y.o. male who presents for   Follow up on cellulitis of RLE, LE ulcer complicated by PAD and uncontrolled DM.  He was treated with Levaquin and sent to Wound Care . Wound has improved but leg remains warm and red .  Denies fevers.  Has a new    Has been using 20 units of Lantus twice daily because the previous regimen 35 units caused low blood sugars in the afternoon .  Patient sleeps late , skips meals and does not follow a low GI diet    Past Medical History  Diagnosis Date  . Hypertension   . Hyperlipidemia   .  Coronary artery disease     lbbb  . PAD (peripheral artery disease)   . Diabetes mellitus     poorly controlled  . Anxiety   . Tobacco abuse   . Peripheral vascular disease     with prior stents placed bilaterally  . MI (myocardial infarction) 2000  . Anemia   . COPD exacerbation   . Acute renal failure     due to ATN versus hypovolemia  . Acute exacerbation of CHF (congestive heart failure)     EF 25%,  . COPD (chronic obstructive pulmonary disease)     Past Surgical History  Procedure Laterality Date  . Coronary artery bypass graft      x4  . Stents in legs  03/2010    [facility]  . Cholecystectomy    . Cardiac catheterization  2010    [facility]  . Cataract extraction    . Cataract extraction, bilateral  2013  . Iliac artery stent    . External iliac      stent, left        The following portions of the patient's history were reviewed and updated as appropriate: Allergies, current medications, and problem list.    Review of Systems:   Patient denies headache, fevers, malaise, unintentional weight loss, skin rash, eye pain, sinus congestion and sinus pain, sore throat, dysphagia,  hemoptysis , cough, dyspnea, wheezing, chest pain, palpitations, orthopnea, edema, abdominal pain, nausea, melena, diarrhea,  constipation, flank pain, dysuria, hematuria, urinary  Frequency, nocturia, numbness, tingling, seizures,  Focal weakness, Loss of consciousness,  Tremor, insomnia, depression, anxiety, and suicidal ideation.     History   Social History  . Marital Status: Single    Spouse Name: N/A    Number of Children: N/A  . Years of Education: N/A   Occupational History  . Not on file.   Social History Main Topics  . Smoking status: Current Every Day Smoker -- 0.50 packs/day for 30 years    Types: Cigarettes    Last Attempt to Quit: 03/19/2013  . Smokeless tobacco: Never Used     Comment: RECENTLY STOPPED  . Alcohol Use: No  . Drug Use: No  .  Sexual Activity: Not on file   Other Topics Concern  . Not on file   Social History Narrative  . No narrative on file    Objective:  Filed Vitals:   03/11/14 1224  BP: 126/58  Pulse: 95  Temp: 98.3 F (36.8 C)  Resp: 16     General appearance: alert, cooperative and appears stated age Ears: normal TM's and external ear canals both ears Throat: lips, mucosa, and tongue normal; teeth and gums normal Neck: no adenopathy, no carotid bruit, supple, symmetrical, trachea midline and thyroid not enlarged, symmetric, no tenderness/mass/nodules Back: symmetric, no curvature. ROM normal. No CVA tenderness. Lungs: clear to auscultation bilaterally Heart: regular rate and rhythm, S1, S2 normal, no murmur, click, rub or gallop Abdomen: soft, non-tender; bowel sounds normal; no masses,  no organomegaly Pulses: 2+ and symmetric Skin: RLE red and warm,  Ulcer nearly resolved.  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  Assessment and Plan:  Atherosclerosis of native arteries of the extremities with ulceration(440.23) Improved  Right  LE wound but persistent cellulitis noted,  Doxycycline to cover staph.   New pressure ulcer on Left stump, new referral to Wound Care   Type II or unspecified type diabetes mellitus with peripheral circulatory disorders, uncontrolled(250.72) Increased dose of Lantus to 25 units QAM and 20 units QPM   Updated Medication List Outpatient Encounter Prescriptions as of 03/11/2014  Medication Sig  . aspirin 81 MG tablet Take 81 mg by mouth daily.  . B-D UF III MINI PEN NEEDLES 31G X 5 MM MISC USE TWICE DAILY AS DIRECTED  . carvedilol (COREG) 12.5 MG tablet TAKE 1 TABLET BY MOUTH TWICE DAILY WITH A MEAL  . DULoxetine (CYMBALTA) 60 MG capsule Take 60 mg by mouth daily.   Marland Kitchen escitalopram (LEXAPRO) 10 MG tablet TAKE 1 TABLET AS NEEDED.  Marland Kitchen esomeprazole (NEXIUM) 40 MG  capsule Take 1 capsule (40 mg total) by mouth 2 (two) times daily.  . ferrous sulfate 325 (65 FE) MG tablet TAKE 1 TABLET TWICE DAILY  WITH A MEAL  . gabapentin (NEURONTIN) 300 MG capsule TAKE 1 CAPSULE BY MOUTH EVERY DAY  . insulin glargine (LANTUS) 100 UNIT/ML injection Inject 30 Units into the skin 2 (two) times daily. INJECT 35 units twice daily  . Insulin Syringe-Needle U-100 (INSULIN SYRINGE 1CC/30GX5/16") 30G X 5/16" 1 ML MISC Use as directed  . ipratropium-albuterol (DUONEB) 0.5-2.5 (3) MG/3ML SOLN Take 3 mLs by nebulization every 4 (four) hours as needed.  . isosorbide mononitrate (IMDUR) 30 MG 24 hr tablet TAKE 1 TABLET BY MOUTH EVERY DAY  . mirtazapine (REMERON) 30 MG tablet TAKE 1 TABLET BY MOUTH EVERY NIGHT  AT BEDTIME AT 9PM.  . OxyCODONE HCl ER (OXYCONTIN) 60 MG T12A Take 1 tablet by mouth every 12 (twelve) hours as needed. For severe pain  . oxyCODONE-acetaminophen (PERCOCET/ROXICET) 5-325 MG per tablet Take 1 tablet by mouth every 6 (six) hours as needed.  . simvastatin (ZOCOR) 40 MG tablet TAKE 1 TABLET BY MOUTH EVERY NIGHT AT BEDTIME FOR CHOLESTEROL  . SPIRIVA HANDIHALER 18 MCG inhalation capsule INHALE CONTENTS OF 1 CAPSULE ONCE DAILY USING HANDIHALER AT 8AM  . torsemide (DEMADEX) 20 MG tablet Take 40 mg by mouth daily.  . traZODone (DESYREL) 50 MG tablet TAKE 1 TABLET BY MOUTH AT BEDTIME  . [DISCONTINUED] furosemide (LASIX) 40 MG tablet Take 80 mg by mouth daily.   . [DISCONTINUED] insulin glargine (LANTUS) 100 UNIT/ML injection INJECT 35 units twice daily  . dimenhyDRINATE (DRAMAMINE) 50 MG tablet Take 50 mg by mouth every 8 (eight) hours as needed.    . doxycycline (VIBRAMYCIN) 100 MG capsule Take 1 capsule (100 mg total) by mouth 2 (two) times daily.  . nitroGLYCERIN (NITROSTAT) 0.4 MG SL tablet Place 1 tablet (0.4 mg total) under the tongue every 5 (five) minutes as needed.  . TDaP (BOOSTRIX) 5-2.5-18.5 LF-MCG/0.5 injection Inject 0.5 mLs into the muscle once.  . zoster  vaccine live, PF, (ZOSTAVAX) 16109 UNT/0.65ML injection Inject 19,400 Units into the skin once.  . [DISCONTINUED] dextromethorphan-guaiFENesin (MUCINEX DM) 30-600 MG per 12 hr tablet Take 1 tablet by mouth 2 (two) times daily.  . [DISCONTINUED] ferrous sulfate 325 (65 FE) MG tablet TAKE 1 TABLET BY MOUTH EVERY DAY WITH A MEAL  . [DISCONTINUED] levofloxacin (LEVAQUIN) 500 MG tablet Take 1 tablet (500 mg total) by mouth daily.  . [DISCONTINUED] torsemide (DEMADEX) 20 MG tablet Take 2 tablets (40 mg total) by mouth 2 (two) times daily.     Orders Placed This Encounter  Procedures  . Ambulatory referral to Pain Clinic    No Follow-up on file.

## 2014-03-11 NOTE — Assessment & Plan Note (Signed)
Increased dose of Lantus to 25 units QAM and 20 units QPM

## 2014-03-11 NOTE — Patient Instructions (Signed)
Change the lantus dose from 25 units in the morning and 20 units in the evening    Submit BS every 2 weeks.  Continueto check twice daily    Fastings   2 hr post prandial ( 2 hr after a meal)     I am putting you on a second antibiotic for the leg  Doxycycline 100 mg twice daily after a meal  To cover staph  Please take a probiotic ( Align, Floraque or Culturelle) for 2 weeks if you start the antibiotic to prevent a serious antibiotic associated diarrhea  Called" clostridium dificile colitis" ( should also help prevent   vaginal yeast infection)   Elevate the leg as much as possible

## 2014-03-11 NOTE — Telephone Encounter (Signed)
PA request form placed in Dr.Tullo Box

## 2014-03-11 NOTE — Telephone Encounter (Signed)
Called [facility] [phone] to initiate PA for Percocet. Spoke with Lequita Halt, will be faxing over form for completion.

## 2014-03-11 NOTE — Progress Notes (Signed)
Pre visit review using our clinic review tool, if applicable. No additional management support is needed unless otherwise documented below in the visit note. 

## 2014-03-11 NOTE — Assessment & Plan Note (Signed)
Improved  Right  LE wound but persistent cellulitis noted,  Doxycycline to cover staph.   New pressure ulcer on Left stump, new referral to Wound Care

## 2014-03-12 ENCOUNTER — Ambulatory Visit: Payer: Medicare Other | Admitting: Internal Medicine

## 2014-03-12 DIAGNOSIS — Z0279 Encounter for issue of other medical certificate: Secondary | ICD-10-CM

## 2014-03-13 NOTE — Telephone Encounter (Signed)
Placed in red folder 03/12/14

## 2014-03-13 NOTE — Telephone Encounter (Signed)
Signed faxed sent to scan and billing patient notified.

## 2014-03-17 ENCOUNTER — Encounter: Payer: Self-pay | Admitting: Internal Medicine

## 2014-03-18 NOTE — Telephone Encounter (Signed)
Olegario MessierKathy,  Can you ask Caprice RedKeri Dunkel if [facility] can assist Mr Patrick HoardBoone in the home? He is having difficulty managing his own medications.

## 2014-03-19 NOTE — Telephone Encounter (Signed)
Notified Caprice RedKeri Dunkel will be in office 03/19/14 to pick up referral. Notified patient referral in process.

## 2014-03-22 ENCOUNTER — Ambulatory Visit: Payer: Medicare Other | Admitting: Adult Health

## 2014-03-22 ENCOUNTER — Other Ambulatory Visit: Payer: Self-pay | Admitting: Internal Medicine

## 2014-03-22 NOTE — Telephone Encounter (Signed)
error 

## 2014-03-25 ENCOUNTER — Telehealth: Payer: Self-pay | Admitting: Internal Medicine

## 2014-03-25 DIAGNOSIS — L98499 Non-pressure chronic ulcer of skin of other sites with unspecified severity: Principal | ICD-10-CM

## 2014-03-25 DIAGNOSIS — I739 Peripheral vascular disease, unspecified: Secondary | ICD-10-CM

## 2014-03-25 NOTE — Telephone Encounter (Signed)
[facility] called (Dawn) to let us know patient was admitted on 02/19/14 to their care and recommendations are for Hospital bed with air mattress, patient will receive nursing, OT and PT please advise on DME for bed and air mattress,.

## 2014-03-26 NOTE — Telephone Encounter (Signed)
Placed in red folder for signature

## 2014-03-26 NOTE — Telephone Encounter (Signed)
DME order placed and printed

## 2014-03-27 ENCOUNTER — Inpatient Hospital Stay: Payer: Self-pay | Admitting: Internal Medicine

## 2014-03-27 LAB — CK-MB: CK-MB: 11 ng/mL — ABNORMAL HIGH (ref 0.5–3.6)

## 2014-03-27 LAB — CBC WITH DIFFERENTIAL/PLATELET
Basophil #: 0 10*3/uL (ref 0.0–0.1)
Basophil %: 0.8 %
EOS ABS: 0.1 10*3/uL (ref 0.0–0.7)
EOS PCT: 1.8 %
HCT: 35.4 % — ABNORMAL LOW (ref 40.0–52.0)
HGB: 10.8 g/dL — ABNORMAL LOW (ref 13.0–18.0)
Lymphocyte #: 0.9 10*3/uL — ABNORMAL LOW (ref 1.0–3.6)
Lymphocyte %: 16.6 %
MCH: 25.5 pg — ABNORMAL LOW (ref 26.0–34.0)
MCHC: 30.6 g/dL — AB (ref 32.0–36.0)
MCV: 83 fL (ref 80–100)
Monocyte #: 0.3 x10 3/mm (ref 0.2–1.0)
Monocyte %: 6.6 %
NEUTROS ABS: 3.8 10*3/uL (ref 1.4–6.5)
Neutrophil %: 74.2 %
Platelet: 237 10*3/uL (ref 150–440)
RBC: 4.26 10*6/uL — AB (ref 4.40–5.90)
RDW: 16.9 % — ABNORMAL HIGH (ref 11.5–14.5)
WBC: 5.2 10*3/uL (ref 3.8–10.6)

## 2014-03-27 LAB — URINALYSIS, COMPLETE
BILIRUBIN, UR: NEGATIVE
Bacteria: NONE SEEN
GLUCOSE, UR: NEGATIVE mg/dL (ref 0–75)
Hyaline Cast: 18
KETONE: NEGATIVE
LEUKOCYTE ESTERASE: NEGATIVE
NITRITE: NEGATIVE
Ph: 5 (ref 4.5–8.0)
Protein: 100
Specific Gravity: 1.008 (ref 1.003–1.030)
Squamous Epithelial: 1
WBC UR: 1 /HPF (ref 0–5)

## 2014-03-27 LAB — BASIC METABOLIC PANEL
ANION GAP: 6 — AB (ref 7–16)
BUN: 23 mg/dL — ABNORMAL HIGH (ref 7–18)
CHLORIDE: 102 mmol/L (ref 98–107)
CO2: 31 mmol/L (ref 21–32)
Calcium, Total: 8.4 mg/dL — ABNORMAL LOW (ref 8.5–10.1)
Creatinine: 1.12 mg/dL (ref 0.60–1.30)
Glucose: 141 mg/dL — ABNORMAL HIGH (ref 65–99)
OSMOLALITY: 284 (ref 275–301)
Potassium: 3.7 mmol/L (ref 3.5–5.1)
Sodium: 139 mmol/L (ref 136–145)

## 2014-03-27 LAB — TROPONIN I: Troponin-I: 0.04 ng/mL

## 2014-03-27 NOTE — Telephone Encounter (Signed)
DME faxed

## 2014-03-28 LAB — BASIC METABOLIC PANEL
ANION GAP: 4 — AB (ref 7–16)
BUN: 28 mg/dL — ABNORMAL HIGH (ref 7–18)
Calcium, Total: 8.4 mg/dL — ABNORMAL LOW (ref 8.5–10.1)
Chloride: 104 mmol/L (ref 98–107)
Co2: 33 mmol/L — ABNORMAL HIGH (ref 21–32)
Creatinine: 1.26 mg/dL (ref 0.60–1.30)
EGFR (African American): >60
EGFR (Non-African Amer.): 57 — ABNORMAL LOW
Glucose: 98 mg/dL (ref 65–99)
Osmolality: 287 (ref 275–301)
POTASSIUM: 3.7 mmol/L (ref 3.5–5.1)
Sodium: 141 mmol/L (ref 136–145)

## 2014-03-28 LAB — CBC WITH DIFFERENTIAL/PLATELET
BASOS ABS: 0.1 10*3/uL (ref 0.0–0.1)
Basophil %: 0.9 %
EOS PCT: 4.1 %
Eosinophil #: 0.2 10*3/uL (ref 0.0–0.7)
HCT: 33.2 % — ABNORMAL LOW (ref 40.0–52.0)
HGB: 10.3 g/dL — ABNORMAL LOW (ref 13.0–18.0)
Lymphocyte #: 1.3 10*3/uL (ref 1.0–3.6)
Lymphocyte %: 23.6 %
MCH: 25.9 pg — AB (ref 26.0–34.0)
MCHC: 31.1 g/dL — AB (ref 32.0–36.0)
MCV: 83 fL (ref 80–100)
Monocyte #: 0.6 x10 3/mm (ref 0.2–1.0)
Monocyte %: 10.2 %
Neutrophil #: 3.5 10*3/uL (ref 1.4–6.5)
Neutrophil %: 61.2 %
PLATELETS: 227 10*3/uL (ref 150–440)
RBC: 3.98 10*6/uL — ABNORMAL LOW (ref 4.40–5.90)
RDW: 17.1 % — ABNORMAL HIGH (ref 11.5–14.5)
WBC: 5.7 10*3/uL (ref 3.8–10.6)

## 2014-03-28 LAB — CK-MB
CK-MB: 6.7 ng/mL — ABNORMAL HIGH (ref 0.5–3.6)
CK-MB: 7.4 ng/mL — ABNORMAL HIGH (ref 0.5–3.6)

## 2014-03-28 LAB — TROPONIN I
TROPONIN-I: 0.03 ng/mL
Troponin-I: 0.03 ng/mL

## 2014-04-03 ENCOUNTER — Other Ambulatory Visit: Payer: Self-pay | Admitting: Internal Medicine

## 2014-04-03 DIAGNOSIS — M48062 Spinal stenosis, lumbar region with neurogenic claudication: Secondary | ICD-10-CM

## 2014-04-03 NOTE — Telephone Encounter (Signed)
Ok refill? 

## 2014-04-03 NOTE — Telephone Encounter (Signed)
This encounter was created in error - please disregard.

## 2014-04-05 MED ORDER — OXYCODONE-ACETAMINOPHEN 5-325 MG PO TABS: 1.0000 | ORAL_TABLET | Freq: Four times a day (QID) | ORAL | Status: DC | PRN

## 2014-04-05 MED ORDER — OXYCODONE HCL ER 60 MG PO T12A: 1.0000 | EXTENDED_RELEASE_TABLET | Freq: Two times a day (BID) | ORAL | Status: DC | PRN

## 2014-04-05 NOTE — Telephone Encounter (Signed)
July 8 refill due,  rx printed

## 2014-04-07 ENCOUNTER — Telehealth: Payer: Self-pay | Admitting: Internal Medicine

## 2014-04-07 DIAGNOSIS — R4182 Altered mental status, unspecified: Secondary | ICD-10-CM | POA: Insufficient documentation

## 2014-04-07 DIAGNOSIS — R400 Somnolence: Secondary | ICD-10-CM

## 2014-04-10 ENCOUNTER — Telehealth: Payer: Self-pay | Admitting: Internal Medicine

## 2014-04-10 DIAGNOSIS — IMO0001 Reserved for inherently not codable concepts without codable children: Secondary | ICD-10-CM

## 2014-04-10 DIAGNOSIS — T40605A Adverse effect of unspecified narcotics, initial encounter: Secondary | ICD-10-CM | POA: Insufficient documentation

## 2014-04-10 NOTE — Telephone Encounter (Signed)
Please call his pharmacy and ask them to NOT FILL HIS NARCOTICS.  His doses were reduced during recent admission due to overdose

## 2014-04-10 NOTE — Telephone Encounter (Signed)
Patrick [name] has violated his narcotics contract by 1) taking his rx to a different pharmacy when his regular pharmacy wouldn't fill it for a stranger  .  He was also admitted to [facility] on June 25th with narcotics overdose and his dose was decreased,  But he did not notify me of that and requested his usual high dose oxycontin.  I will no longer fill any of his narcotics.  Please notify patient via letter.   

## 2014-04-10 NOTE — Telephone Encounter (Signed)
Called pharmacy [facility] had no filled script, patient did have script filled at [facility].by third party un-identified.

## 2014-04-10 NOTE — Assessment & Plan Note (Signed)
Mr Patrick [name] has violated his narcotics contract by 1) taking his rx to a different pharmacy when his regular pharmacy wouldn't fill it for a stranger  .  He was also admitted to [facility] on June 25th with narcotics overdose and his dose was decreased,  But he did not notify me of that and requested his usual high dose oxycontin.  I will no longer fill any of his narcotics.  Please notify patient via letter.

## 2014-04-18 ENCOUNTER — Other Ambulatory Visit: Payer: Self-pay | Admitting: Internal Medicine

## 2014-04-22 ENCOUNTER — Ambulatory Visit: Payer: Medicare Other | Admitting: Internal Medicine

## 2014-05-01 ENCOUNTER — Other Ambulatory Visit: Payer: Self-pay | Admitting: Internal Medicine

## 2014-05-01 NOTE — Telephone Encounter (Signed)
Refills are denied. This is not negotiable since there was an attempt to fill a prescription at an alternative pharmacy after he was discharged from hospital after a narcotics overdose.  E mail sent as well

## 2014-05-01 NOTE — Telephone Encounter (Signed)
Patient daughter called also leaving refill request on voicemail, for Oxycodone and OxyContin patient did not receive letter not one located in chart. Patient is denying the Narcotics Over dose and his daughter took phone and became very upset and stated she would like to come in with patient discuss. Please advise.

## 2014-05-02 MED ORDER — SIMVASTATIN 40 MG PO TABS: ORAL_TABLET | ORAL | Status: AC

## 2014-05-02 NOTE — Telephone Encounter (Signed)
Sin=mvastatin refilled for 30 days .  He has nt had hospiral followup feom his admission for AMS

## 2014-05-02 NOTE — Telephone Encounter (Signed)
Request was sent to [facility] on July 8th to send letter vir narcotics contract violation.  Carollee HerterShannon was this not done?

## 2014-05-10 ENCOUNTER — Encounter: Payer: Self-pay | Admitting: *Deleted

## 2014-05-16 ENCOUNTER — Other Ambulatory Visit: Payer: Self-pay | Admitting: Internal Medicine

## 2014-05-18 ENCOUNTER — Other Ambulatory Visit: Payer: Self-pay | Admitting: Internal Medicine

## 2014-05-22 ENCOUNTER — Other Ambulatory Visit: Payer: Self-pay | Admitting: Internal Medicine

## 2014-05-22 ENCOUNTER — Other Ambulatory Visit: Payer: Self-pay | Admitting: Cardiovascular Disease

## 2014-06-03 NOTE — Telephone Encounter (Signed)
Dr. Darrick Huntsman will need to draft letter and once drafted we can send certified mail. She is aware that she needs to draft letter. If I am not here you send to Lenard Forth at [facility] and ask it to be sent certified.

## 2014-06-03 NOTE — Telephone Encounter (Signed)
Please advise 

## 2014-06-04 NOTE — Telephone Encounter (Signed)
Letter placed in quick sign.

## 2014-06-04 NOTE — Telephone Encounter (Signed)
Letter printed.

## 2014-06-06 ENCOUNTER — Encounter: Payer: Self-pay | Admitting: Surgery

## 2014-06-07 ENCOUNTER — Telehealth: Payer: Self-pay | Admitting: Internal Medicine

## 2014-06-07 NOTE — Telephone Encounter (Signed)
Sent Letter from Dr. Darrick Huntsman dated 06/04/14 by Certified Mail 06/07/2014  Received the Return Receipt showing someone picked up the patients letter 06/13/2014

## 2014-06-13 NOTE — Telephone Encounter (Signed)
Yes the letter was sent to patient via certified mail. I am not sure what date I sent the letter.

## 2014-06-14 ENCOUNTER — Other Ambulatory Visit: Payer: Self-pay | Admitting: Internal Medicine

## 2014-06-15 ENCOUNTER — Other Ambulatory Visit: Payer: Self-pay | Admitting: Internal Medicine

## 2014-06-20 ENCOUNTER — Other Ambulatory Visit: Payer: Self-pay | Admitting: Cardiovascular Disease

## 2014-06-23 ENCOUNTER — Emergency Department (HOSPITAL_COMMUNITY): Payer: Medicare Other

## 2014-06-23 ENCOUNTER — Inpatient Hospital Stay (HOSPITAL_COMMUNITY)
Admission: EM | Admit: 2014-06-23 | Discharge: 2014-06-27 | DRG: 291 | Disposition: A | Discharge status: Skilled Nursing Facility | Payer: Medicare Other | Attending: Internal Medicine | Admitting: Internal Medicine

## 2014-06-23 ENCOUNTER — Encounter (HOSPITAL_COMMUNITY): Payer: Self-pay | Admitting: Emergency Medicine

## 2014-06-23 DIAGNOSIS — Z9089 Acquired absence of other organs: Secondary | ICD-10-CM

## 2014-06-23 DIAGNOSIS — E1159 Type 2 diabetes mellitus with other circulatory complications: Secondary | ICD-10-CM

## 2014-06-23 DIAGNOSIS — F1120 Opioid dependence, uncomplicated: Secondary | ICD-10-CM | POA: Diagnosis present

## 2014-06-23 DIAGNOSIS — I2589 Other forms of chronic ischemic heart disease: Secondary | ICD-10-CM | POA: Diagnosis present

## 2014-06-23 DIAGNOSIS — N183 Chronic kidney disease, stage 3 unspecified: Secondary | ICD-10-CM | POA: Diagnosis present

## 2014-06-23 DIAGNOSIS — J449 Chronic obstructive pulmonary disease, unspecified: Secondary | ICD-10-CM | POA: Diagnosis present

## 2014-06-23 DIAGNOSIS — J9819 Other pulmonary collapse: Secondary | ICD-10-CM | POA: Diagnosis present

## 2014-06-23 DIAGNOSIS — I129 Hypertensive chronic kidney disease with stage 1 through stage 4 chronic kidney disease, or unspecified chronic kidney disease: Secondary | ICD-10-CM | POA: Diagnosis present

## 2014-06-23 DIAGNOSIS — N179 Acute kidney failure, unspecified: Secondary | ICD-10-CM | POA: Diagnosis present

## 2014-06-23 DIAGNOSIS — G9340 Encephalopathy, unspecified: Secondary | ICD-10-CM | POA: Diagnosis present

## 2014-06-23 DIAGNOSIS — I5021 Acute systolic (congestive) heart failure: Secondary | ICD-10-CM

## 2014-06-23 DIAGNOSIS — K5900 Constipation, unspecified: Secondary | ICD-10-CM | POA: Diagnosis present

## 2014-06-23 DIAGNOSIS — E872 Acidosis, unspecified: Secondary | ICD-10-CM | POA: Diagnosis present

## 2014-06-23 DIAGNOSIS — T8789 Other complications of amputation stump: Secondary | ICD-10-CM | POA: Diagnosis present

## 2014-06-23 DIAGNOSIS — R0602 Shortness of breath: Secondary | ICD-10-CM | POA: Diagnosis present

## 2014-06-23 DIAGNOSIS — D649 Anemia, unspecified: Secondary | ICD-10-CM | POA: Diagnosis present

## 2014-06-23 DIAGNOSIS — J9600 Acute respiratory failure, unspecified whether with hypoxia or hypercapnia: Secondary | ICD-10-CM | POA: Diagnosis present

## 2014-06-23 DIAGNOSIS — E876 Hypokalemia: Secondary | ICD-10-CM | POA: Diagnosis present

## 2014-06-23 DIAGNOSIS — Z9119 Patient's noncompliance with other medical treatment and regimen: Secondary | ICD-10-CM

## 2014-06-23 DIAGNOSIS — G8929 Other chronic pain: Secondary | ICD-10-CM | POA: Diagnosis present

## 2014-06-23 DIAGNOSIS — E785 Hyperlipidemia, unspecified: Secondary | ICD-10-CM | POA: Diagnosis present

## 2014-06-23 DIAGNOSIS — Z951 Presence of aortocoronary bypass graft: Secondary | ICD-10-CM

## 2014-06-23 DIAGNOSIS — Z79899 Other long term (current) drug therapy: Secondary | ICD-10-CM

## 2014-06-23 DIAGNOSIS — I5023 Acute on chronic systolic (congestive) heart failure: Principal | ICD-10-CM | POA: Diagnosis present

## 2014-06-23 DIAGNOSIS — Z7982 Long term (current) use of aspirin: Secondary | ICD-10-CM

## 2014-06-23 DIAGNOSIS — E1122 Type 2 diabetes mellitus with diabetic chronic kidney disease: Secondary | ICD-10-CM | POA: Diagnosis present

## 2014-06-23 DIAGNOSIS — Z9849 Cataract extraction status, unspecified eye: Secondary | ICD-10-CM

## 2014-06-23 DIAGNOSIS — Z66 Do not resuscitate: Secondary | ICD-10-CM | POA: Diagnosis present

## 2014-06-23 DIAGNOSIS — Z91199 Patient's noncompliance with other medical treatment and regimen due to unspecified reason: Secondary | ICD-10-CM

## 2014-06-23 DIAGNOSIS — Z8249 Family history of ischemic heart disease and other diseases of the circulatory system: Secondary | ICD-10-CM

## 2014-06-23 DIAGNOSIS — I10 Essential (primary) hypertension: Secondary | ICD-10-CM

## 2014-06-23 DIAGNOSIS — N058 Unspecified nephritic syndrome with other morphologic changes: Secondary | ICD-10-CM | POA: Diagnosis present

## 2014-06-23 DIAGNOSIS — I252 Old myocardial infarction: Secondary | ICD-10-CM | POA: Diagnosis not present

## 2014-06-23 DIAGNOSIS — G9341 Metabolic encephalopathy: Secondary | ICD-10-CM | POA: Diagnosis present

## 2014-06-23 DIAGNOSIS — F17200 Nicotine dependence, unspecified, uncomplicated: Secondary | ICD-10-CM | POA: Diagnosis present

## 2014-06-23 DIAGNOSIS — J4489 Other specified chronic obstructive pulmonary disease: Secondary | ICD-10-CM | POA: Diagnosis present

## 2014-06-23 DIAGNOSIS — R195 Other fecal abnormalities: Secondary | ICD-10-CM

## 2014-06-23 DIAGNOSIS — I509 Heart failure, unspecified: Secondary | ICD-10-CM | POA: Diagnosis present

## 2014-06-23 DIAGNOSIS — J9602 Acute respiratory failure with hypercapnia: Secondary | ICD-10-CM

## 2014-06-23 DIAGNOSIS — I2510 Atherosclerotic heart disease of native coronary artery without angina pectoris: Secondary | ICD-10-CM | POA: Diagnosis present

## 2014-06-23 DIAGNOSIS — N478 Other disorders of prepuce: Secondary | ICD-10-CM | POA: Diagnosis present

## 2014-06-23 DIAGNOSIS — J90 Pleural effusion, not elsewhere classified: Secondary | ICD-10-CM | POA: Diagnosis present

## 2014-06-23 DIAGNOSIS — T40605A Adverse effect of unspecified narcotics, initial encounter: Secondary | ICD-10-CM | POA: Diagnosis present

## 2014-06-23 DIAGNOSIS — Y835 Amputation of limb(s) as the cause of abnormal reaction of the patient, or of later complication, without mention of misadventure at the time of the procedure: Secondary | ICD-10-CM | POA: Diagnosis present

## 2014-06-23 DIAGNOSIS — E1129 Type 2 diabetes mellitus with other diabetic kidney complication: Secondary | ICD-10-CM | POA: Diagnosis present

## 2014-06-23 DIAGNOSIS — S88119A Complete traumatic amputation at level between knee and ankle, unspecified lower leg, initial encounter: Secondary | ICD-10-CM | POA: Diagnosis not present

## 2014-06-23 DIAGNOSIS — R339 Retention of urine, unspecified: Secondary | ICD-10-CM | POA: Diagnosis present

## 2014-06-23 DIAGNOSIS — I25810 Atherosclerosis of coronary artery bypass graft(s) without angina pectoris: Secondary | ICD-10-CM

## 2014-06-23 DIAGNOSIS — Z823 Family history of stroke: Secondary | ICD-10-CM | POA: Diagnosis not present

## 2014-06-23 DIAGNOSIS — E1165 Type 2 diabetes mellitus with hyperglycemia: Secondary | ICD-10-CM | POA: Diagnosis present

## 2014-06-23 DIAGNOSIS — I255 Ischemic cardiomyopathy: Secondary | ICD-10-CM | POA: Diagnosis present

## 2014-06-23 DIAGNOSIS — Z716 Tobacco abuse counseling: Secondary | ICD-10-CM

## 2014-06-23 DIAGNOSIS — I25719 Atherosclerosis of autologous vein coronary artery bypass graft(s) with unspecified angina pectoris: Secondary | ICD-10-CM

## 2014-06-23 DIAGNOSIS — N471 Phimosis: Secondary | ICD-10-CM | POA: Diagnosis present

## 2014-06-23 LAB — COMPREHENSIVE METABOLIC PANEL
ALBUMIN: 2.5 g/dL — AB (ref 3.5–5.2)
ALT: 14 U/L (ref 0–53)
ANION GAP: 9 (ref 5–15)
AST: 36 U/L (ref 0–37)
Alkaline Phosphatase: 89 U/L (ref 39–117)
BUN: 20 mg/dL (ref 6–23)
CO2: 35 mEq/L — ABNORMAL HIGH (ref 19–32)
Calcium: 8.6 mg/dL (ref 8.4–10.5)
Chloride: 98 mEq/L (ref 96–112)
Creatinine, Ser: 1.24 mg/dL (ref 0.50–1.35)
GFR calc Af Amer: 66 mL/min — ABNORMAL LOW (ref >90)
GFR calc non Af Amer: 57 mL/min — ABNORMAL LOW (ref >90)
Glucose, Bld: 158 mg/dL — ABNORMAL HIGH (ref 70–99)
POTASSIUM: 3.8 meq/L (ref 3.7–5.3)
Sodium: 142 mEq/L (ref 137–147)
TOTAL PROTEIN: 6.7 g/dL (ref 6.0–8.3)
Total Bilirubin: 0.5 mg/dL (ref 0.3–1.2)

## 2014-06-23 LAB — BLOOD GAS, ARTERIAL
ACID-BASE EXCESS: 7.4 mmol/L — AB (ref 0.0–2.0)
BICARBONATE: 33.5 meq/L — AB (ref 20.0–24.0)
Drawn by: 22179
O2 Content: 3 L/min
O2 SAT: 91.7 %
PATIENT TEMPERATURE: 37
TCO2: 31.2 mmol/L (ref 0–100)
pCO2 arterial: 69.2 mmHg (ref 35.0–45.0)
pH, Arterial: 7.306 — ABNORMAL LOW (ref 7.350–7.450)
pO2, Arterial: 78.7 mmHg — ABNORMAL LOW (ref 80.0–100.0)

## 2014-06-23 LAB — CBC WITH DIFFERENTIAL/PLATELET
Basophils Absolute: 0 10*3/uL (ref 0.0–0.1)
Basophils Relative: 1 % (ref 0–1)
Eosinophils Absolute: 0.1 10*3/uL (ref 0.0–0.7)
Eosinophils Relative: 2 % (ref 0–5)
HEMATOCRIT: 38.2 % — AB (ref 39.0–52.0)
Hemoglobin: 11.5 g/dL — ABNORMAL LOW (ref 13.0–17.0)
LYMPHS PCT: 15 % (ref 12–46)
Lymphs Abs: 0.7 10*3/uL (ref 0.7–4.0)
MCH: 25.5 pg — ABNORMAL LOW (ref 26.0–34.0)
MCHC: 30.1 g/dL (ref 30.0–36.0)
MCV: 84.7 fL (ref 78.0–100.0)
MONO ABS: 0.4 10*3/uL (ref 0.1–1.0)
Monocytes Relative: 9 % (ref 3–12)
Neutro Abs: 3.7 10*3/uL (ref 1.7–7.7)
Neutrophils Relative %: 73 % (ref 43–77)
PLATELETS: 276 10*3/uL (ref 150–400)
RBC: 4.51 MIL/uL (ref 4.22–5.81)
RDW: 16.5 % — ABNORMAL HIGH (ref 11.5–15.5)
WBC: 5 10*3/uL (ref 4.0–10.5)

## 2014-06-23 LAB — GLUCOSE, CAPILLARY: Glucose-Capillary: 142 mg/dL — ABNORMAL HIGH (ref 70–99)

## 2014-06-23 LAB — TROPONIN I
Troponin I: <0.3 ng/mL (ref <0.30)
Troponin I: <0.3 ng/mL (ref <0.30)

## 2014-06-23 LAB — PRO B NATRIURETIC PEPTIDE: Pro B Natriuretic peptide (BNP): 37369 pg/mL — ABNORMAL HIGH (ref 0–125)

## 2014-06-23 LAB — MRSA PCR SCREENING: MRSA by PCR: NEGATIVE

## 2014-06-23 LAB — PROTIME-INR
INR: 1.38 (ref 0.00–1.49)
Prothrombin Time: 17 seconds — ABNORMAL HIGH (ref 11.6–15.2)

## 2014-06-23 LAB — APTT: aPTT: 33 seconds (ref 24–37)

## 2014-06-23 LAB — LIPASE, BLOOD: LIPASE: 10 U/L — AB (ref 11–59)

## 2014-06-23 MED ORDER — IOHEXOL 300 MG/ML  SOLN
50.0000 mL | Freq: Once | INTRAMUSCULAR | Status: AC | PRN
  Administered 2014-06-23: 50 mL via ORAL

## 2014-06-23 MED ORDER — CARVEDILOL 3.125 MG PO TABS: 3.1250 mg | ORAL_TABLET | Freq: Two times a day (BID) | ORAL | Status: DC

## 2014-06-23 MED ORDER — INSULIN ASPART 100 UNIT/ML ~~LOC~~ SOLN
0.0000 [IU] | SUBCUTANEOUS | Status: DC
  Administered 2014-06-23 – 2014-06-24 (×2): 2 [IU] via SUBCUTANEOUS
  Administered 2014-06-24: 3 [IU] via SUBCUTANEOUS
  Administered 2014-06-25: 5 [IU] via SUBCUTANEOUS
  Administered 2014-06-25 (×3): 3 [IU] via SUBCUTANEOUS
  Administered 2014-06-25: 8 [IU] via SUBCUTANEOUS
  Administered 2014-06-26: 11 [IU] via SUBCUTANEOUS
  Administered 2014-06-26: 3 [IU] via SUBCUTANEOUS
  Administered 2014-06-26: 11 [IU] via SUBCUTANEOUS
  Administered 2014-06-26: 3 [IU] via SUBCUTANEOUS
  Administered 2014-06-26 – 2014-06-27 (×3): 2 [IU] via SUBCUTANEOUS

## 2014-06-23 MED ORDER — ASPIRIN EC 81 MG PO TBEC
81.0000 mg | DELAYED_RELEASE_TABLET | Freq: Every day | ORAL | Status: DC
  Administered 2014-06-24: 81 mg via ORAL
  Filled 2014-06-23: qty 1

## 2014-06-23 MED ORDER — SODIUM CHLORIDE 0.9 % IJ SOLN
3.0000 mL | Freq: Two times a day (BID) | INTRAMUSCULAR | Status: DC
  Administered 2014-06-24 – 2014-06-27 (×6): 3 mL via INTRAVENOUS

## 2014-06-23 MED ORDER — FUROSEMIDE 10 MG/ML IJ SOLN
40.0000 mg | INTRAMUSCULAR | Status: AC
  Administered 2014-06-23: 40 mg via INTRAVENOUS
  Filled 2014-06-23: qty 4

## 2014-06-23 MED ORDER — SODIUM CHLORIDE 0.9 % IV SOLN: 250.0000 mL | INTRAVENOUS | Status: DC | PRN

## 2014-06-23 MED ORDER — ASPIRIN 81 MG PO CHEW
324.0000 mg | CHEWABLE_TABLET | Freq: Once | ORAL | Status: AC
  Administered 2014-06-23: 324 mg via ORAL
  Filled 2014-06-23: qty 4

## 2014-06-23 MED ORDER — FUROSEMIDE 10 MG/ML IJ SOLN
INTRAMUSCULAR | Status: AC
  Filled 2014-06-23: qty 4

## 2014-06-23 MED ORDER — PANTOPRAZOLE SODIUM 40 MG PO TBEC
40.0000 mg | DELAYED_RELEASE_TABLET | Freq: Two times a day (BID) | ORAL | Status: DC
  Administered 2014-06-24 – 2014-06-27 (×7): 40 mg via ORAL
  Filled 2014-06-23 (×9): qty 1

## 2014-06-23 MED ORDER — TIOTROPIUM BROMIDE MONOHYDRATE 18 MCG IN CAPS
18.0000 ug | ORAL_CAPSULE | Freq: Every day | RESPIRATORY_TRACT | Status: DC
  Administered 2014-06-26 – 2014-06-27 (×2): 18 ug via RESPIRATORY_TRACT
  Filled 2014-06-23 (×3): qty 5

## 2014-06-23 MED ORDER — SODIUM CHLORIDE 0.9 % IJ SOLN: 3.0000 mL | INTRAMUSCULAR | Status: DC | PRN

## 2014-06-23 MED ORDER — FUROSEMIDE 10 MG/ML IJ SOLN
40.0000 mg | Freq: Once | INTRAMUSCULAR | Status: AC
  Administered 2014-06-23: 40 mg via INTRAVENOUS

## 2014-06-23 MED ORDER — NALOXONE HCL 0.4 MG/ML IJ SOLN: 0.4000 mg | INTRAMUSCULAR | Status: DC | PRN

## 2014-06-23 MED ORDER — ACETAMINOPHEN 325 MG PO TABS: 650.0000 mg | ORAL_TABLET | ORAL | Status: DC | PRN

## 2014-06-23 MED ORDER — FUROSEMIDE 10 MG/ML IJ SOLN
80.0000 mg | Freq: Two times a day (BID) | INTRAMUSCULAR | Status: DC
  Administered 2014-06-24: 80 mg via INTRAVENOUS
  Filled 2014-06-23 (×2): qty 8

## 2014-06-23 MED ORDER — ASPIRIN 81 MG PO TABS: 81.0000 mg | ORAL_TABLET | Freq: Every day | ORAL | Status: DC

## 2014-06-23 MED ORDER — ONDANSETRON HCL 4 MG/2ML IJ SOLN
4.0000 mg | Freq: Four times a day (QID) | INTRAMUSCULAR | Status: DC | PRN
Start: 2014-06-23 — End: 2014-06-27

## 2014-06-23 MED ORDER — IPRATROPIUM-ALBUTEROL 0.5-2.5 (3) MG/3ML IN SOLN: 3.0000 mL | RESPIRATORY_TRACT | Status: DC | PRN

## 2014-06-23 NOTE — Progress Notes (Signed)
Pt daughter stated pt has not voided since before 11am today. ED nurse attempted to place foley with no success. Md made aware. When patient arrived to the ICU, bladder was scanned with 300cc urine present. A.Stone, RN attempted to insert foley with no success. MD paged to make aware. Pt is agitated and c/o of having to urinate. Awaiting for further orders. Will continue to monitor.

## 2014-06-23 NOTE — ED Notes (Signed)
Patient states black tarry stools over last several weeks.

## 2014-06-23 NOTE — Progress Notes (Signed)
Bladder scanned at 2202 tonight  Results of approx. 123 cc of urine

## 2014-06-23 NOTE — ED Notes (Signed)
Upon transport to the floor. Patient's hand and forearm fell between the stretcher arm rails and hit the door frame of room. Skin tear noted to right posterior hand and right forearm. Wound care provided, wound dressed.

## 2014-06-23 NOTE — ED Provider Notes (Signed)
CSN: [id number]     Arrival date & time 06/23/14  1238 History   First MD Initiated Contact with Patient 06/23/14 1257     Chief Complaint  Patient presents with  . Abdominal Pain     (Consider location/radiation/quality/duration/timing/severity/associated sxs/prior Treatment) HPI Comments: Pt is a 73 y/o male with hx of DM and subsequent LLE BKA who presents with abd pain - mid abd in location, crampy and dull, constant and not associated with f/c.  He has dark stools which are hard - has been on percocet and Iron pills.  Denies vomiting but has nausea.  Has hx of cholecystectomy, HTN, COPD, MI, ARF and CHF.  Sx are gradually worsening.  Denies ETOH use.  Patient is a 73 y.o. male presenting with abdominal pain. The history is provided by the patient and medical records.  Abdominal Pain   Past Medical History  Diagnosis Date  . Hypertension   . Hyperlipidemia   . Coronary artery disease     lbbb  . PAD (peripheral artery disease)   . Diabetes mellitus     poorly controlled  . Anxiety   . Tobacco abuse   . Peripheral vascular disease     with prior stents placed bilaterally  . MI (myocardial infarction) 2000  . Anemia   . COPD exacerbation   . Acute renal failure     due to ATN versus hypovolemia  . Acute exacerbation of CHF (congestive heart failure)     EF 25%,  . COPD (chronic obstructive pulmonary disease)    Past Surgical History  Procedure Laterality Date  . Coronary artery bypass graft      x4  . Stents in legs  03/2010    [facility]  . Cholecystectomy    . Cardiac catheterization  2010    [place]  . Cataract extraction    . Cataract extraction, bilateral  2013  . Iliac artery stent    . External iliac      stent, left    Family History  Problem Relation Age of Onset  . Heart attack Father   . Stroke Mother    History  Substance Use Topics  . Smoking status: Current Every Day Smoker -- 0.50 packs/day for 30 years    Types:  Cigarettes    Last Attempt to Quit: 03/19/2013  . Smokeless tobacco: Never Used     Comment: RECENTLY STOPPED  . Alcohol Use: No    Review of Systems  Gastrointestinal: Positive for abdominal pain.  All other systems reviewed and are negative.     Allergies  Amoxicillin  Home Medications   Prior to Admission medications   Medication Sig Start Date End Date Taking? Authorizing Provider  aspirin 81 MG tablet Take 81 mg by mouth daily.   Yes Historical Provider, MD  aspirin-sod bicarb-citric acid (ALKA-SELTZER) 325 MG TBEF tablet Take 325 mg by mouth every 6 (six) hours as needed (stomach).   Yes Historical Provider, MD  B-D UF III MINI PEN NEEDLES 31G X 5 MM MISC USE TWICE DAILY AS DIRECTED   Yes Sherlene Shams, MD  carvedilol (COREG) 12.5 MG tablet TAKE 1 TABLET BY MOUTH TWICE DAILY WITH A MEAL 07/17/13  Yes Sherlene Shams, MD  dimenhyDRINATE (DRAMAMINE) 50 MG tablet Take 50 mg by mouth every 8 (eight) hours as needed for nausea.    Yes Historical Provider, MD  esomeprazole (NEXIUM) 40 MG capsule Take 1 capsule (40 mg total) by mouth  2 (two) times daily. 07/10/13  Yes Sherlene Shams, MD  ferrous sulfate 325 (65 FE) MG tablet Take 1 tablet twice daily with a meal. 07/15/13  Yes Sherlene Shams, MD  gabapentin (NEURONTIN) 300 MG capsule TAKE ONE CAPSULE BY MOUTH EVERY DAY 06/17/14  Yes Sherlene Shams, MD  insulin glargine (LANTUS) 100 UNIT/ML injection Inject 10-12 Units into the skin 2 (two) times daily.  02/27/14  Yes Sherlene Shams, MD  Insulin Syringe-Needle U-100 (INSULIN SYRINGE 1CC/30GX5/16") 30G X 5/16" 1 ML MISC Use as directed 01/21/14  Yes Sherlene Shams, MD  ipratropium-albuterol (DUONEB) 0.5-2.5 (3) MG/3ML SOLN Take 3 mLs by nebulization every 4 (four) hours as needed (shortness of breath).    Yes Historical Provider, MD  nitroGLYCERIN (NITROSTAT) 0.4 MG SL tablet Place 1 tablet (0.4 mg total) under the tongue every 5 (five) minutes as needed. 02/22/13  Yes Raquel Conni Elliot, NP   OxyCODONE HCl ER (OXYCONTIN) 60 MG T12A Take 1 tablet by mouth every 12 (twelve) hours.   Yes Historical Provider, MD  oxyCODONE-acetaminophen (PERCOCET/ROXICET) 5-325 MG per tablet Take 1 tablet by mouth every 4 (four) hours as needed for moderate pain or severe pain.   Yes Historical Provider, MD  simvastatin (ZOCOR) 40 MG tablet TAKE 1 TABLET BY MOUTH EVERY NIGHT AT BEDTIME FOR CHOLESTEROL 05/02/14  Yes Sherlene Shams, MD  SPIRIVA HANDIHALER 18 MCG inhalation capsule INHALE CONTENTS OF 1 CAPSULE ONCE DAILY USING HANDIHALER AT 8AM 07/16/13  Yes Sherlene Shams, MD  torsemide (DEMADEX) 20 MG tablet Take 20-40 mg by mouth daily.   Yes Historical Provider, MD  TDaP (BOOSTRIX) 5-2.5-18.5 LF-MCG/0.5 injection Inject 0.5 mLs into the muscle once. 07/10/13   Sherlene Shams, MD  zoster vaccine live, PF, (ZOSTAVAX) 78295 UNT/0.65ML injection Inject 19,400 Units into the skin once. 07/10/13   Sherlene Shams, MD   BP 119/73  Pulse 91  Temp(Src) 98 F (36.7 C) (Oral)  Resp 14  Ht  (1.778 m)  Wt 160 lb (72.576 kg)  BMI 22.96 kg/m2  SpO2 91% Physical Exam  Nursing note and vitals reviewed. Constitutional: He appears well-developed and well-nourished. No distress.  HENT:  Head: Normocephalic and atraumatic.  Mouth/Throat: Oropharynx is clear and moist. No oropharyngeal exudate.  Eyes: Conjunctivae and EOM are normal. Pupils are equal, round, and reactive to light. Right eye exhibits no discharge. Left eye exhibits no discharge. No scleral icterus.  Neck: Normal range of motion. Neck supple. No JVD present. No thyromegaly present.  Cardiovascular: Normal rate, regular rhythm, normal heart sounds and intact distal pulses.  Exam reveals no gallop and no friction rub.   No murmur heard. Pulmonary/Chest: Effort normal. No respiratory distress. He has no wheezes. He has rales (inspiratory rales at the bases bilaterally).  Slight tachypnea, speaks in full sentences  Abdominal: Soft. Bowel sounds are  normal. He exhibits no distension and no mass. There is no tenderness.  Mild anasarca up to the level of the umbilicus, left greater than right  Genitourinary:  Rectal exam, no hemorrhoids, no fissures, black hard stool in the rectal vault, occult positive, no bright red blood  Musculoskeletal: Normal range of motion. He exhibits no edema and no tenderness.  BKA on the left, right lower extremity with adequate capillary refill time, poorly palpable pedal pulses, no pain in the leg  Lymphadenopathy:    He has no cervical adenopathy.  Neurological: He is alert. Coordination normal.  Skin: Skin is warm and  dry. No rash noted. No erythema.  Psychiatric: He has a normal mood and affect. His behavior is normal.    ED Course  Procedures (including critical care time) Labs Review Labs Reviewed  CBC WITH DIFFERENTIAL - Abnormal; Notable for the following:    Hemoglobin 11.5 (*)    HCT 38.2 (*)    MCH 25.5 (*)    RDW 16.5 (*)    All other components within normal limits  COMPREHENSIVE METABOLIC PANEL - Abnormal; Notable for the following:    CO2 35 (*)    Glucose, Bld 158 (*)    Albumin 2.5 (*)    GFR calc non Af Amer 57 (*)    GFR calc Af Amer 66 (*)    All other components within normal limits  LIPASE, BLOOD - Abnormal; Notable for the following:    Lipase 10 (*)    All other components within normal limits  PROTIME-INR - Abnormal; Notable for the following:    Prothrombin Time 17.0 (*)    All other components within normal limits  APTT  TROPONIN I  URINALYSIS, ROUTINE W REFLEX MICROSCOPIC    Imaging Review Ct Abdomen Pelvis Wo Contrast  06/23/2014   CLINICAL DATA:  Two days of abdominal pain and generalized weakness. Patient reports melena  EXAM: CT ABDOMEN AND PELVIS WITHOUT CONTRAST  TECHNIQUE: Multidetector CT imaging of the abdomen and pelvis was performed following the standard protocol without IV contrast. The patient ingested only a small amount of oral contrast.   COMPARISON:  Portable chest x-ray of today's date  FINDINGS: There is a large right pleural effusion with small left pleural effusion. There is mild atelectasis of the right lower lobe adjacent to the effusion. The cardiac chambers are mildly enlarged.  The stomach is partially distended with nonopacified fluid. There is no evidence of a small bowel obstruction. Contrast has reached the colon. The stool and gas pattern is unremarkable. There is sigmoid diverticulosis. A moderate amount of stool in the rectal vault is demonstrated. There is no free extraluminal fluid or gas. The perisigmoid soft tissues exhibit no significant significant inflammatory changes.  The gallbladder is surgically absent. The liver, spleen, pancreas, adrenal glands, and kidneys exhibit no acute abnormalities. There at rounded exophytic hypodensities associated with the mid and upper poles of the right kidney which exhibit Hounsfield value is of between 4 in 17. There are no calcified urinary tract stones. The abdominal aorta exhibits dense mural calcification but no aneurysm. There is no periaortic or pericaval lymphadenopathy.  The urinary bladder, prostate gland, and seminal vesicles exhibit no acute abnormalities.  There is diffusely increased density in the subcutaneous fat consistent with anasarca. There is degenerative disc space narrowing at L4-5. There is no compression fracture. The bony pelvis exhibits no acute abnormalities.  IMPRESSION: 1. There is no evidence of bowel obstruction, enteritis, or acute colitis. No mural mass is demonstrated. There is sigmoid diverticulosis without definite evidence of acute diverticulitis. Certainly low-grade diverticulitis could be present without CT findings. There may be a fecal impaction given the moderately increased volume of stool in the rectum. 2. There is no acute hepatobiliary nor acute urinary tract abnormality. There are renal cysts on the right. 3. There is a large right pleural  effusion with smaller left pleural effusion of uncertain etiology. There is diffusely increased density in the subcutaneous fat consistent with anasarca. There is enlargement of the cardiac chambers.   Electronically Signed   By: David  [place]   On: 06/23/2014  16:14   Dg Chest Port 1 View  06/23/2014   CLINICAL DATA:  Shortness of breath.  Weakness.  EXAM: PORTABLE CHEST - 1 VIEW  COMPARISON:  None.  FINDINGS: Cardiomegaly. Status post median sternotomy. Elevation of the right hemidiaphragm. Consolidative opacity within the right lower hemi thorax. Extensive perihilar interstitial pulmonary opacities. No definite pneumothorax.  IMPRESSION: Cardiomegaly.  Consolidative opacity within the right lower hemi thorax may represent elevation of the hemidiaphragm or potentially sub pulmonic effusion.  Perihilar interstitial opacities suggestive of edema in the acute setting.   Electronically Signed   By: Annia Belt M.D.   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  MDM   Final diagnoses:  Acute systolic congestive heart failure  Pleural effusion    The patient has abnormal oxygenation with rales on breathing, abdominal discomfort but no palpable mass or obvious tenderness to palpation. He will need evaluation with labs, x-ray of the chest and possible CT of the abdomen to evaluate for source of the patient's symptoms. I have performed a personal Hemoccult sample on the patient and it is positive. The patient does endorse using aspirin.  Review of the  medical record shows that the patient has a cardiologist and Dr. [name]  And has been seen several times over the last couple of years including for an echocardiogram performed in 2013 showing an ejection fraction of 20-25%. 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  Meds given in ED:  Medications  aspirin chewable tablet 324 mg (not administered)  iohexol (OMNIPAQUE) 300 MG/ML solution 50 mL (50 mLs Oral Contrast Given 06/23/14 1422)  furosemide (LASIX) injection 40 mg (40 mg Intravenous Given 06/23/14 1654)    New Prescriptions   No medications on file      Vida Roller, MD 06/23/14 1719

## 2014-06-23 NOTE — ED Notes (Addendum)
Attempted to insert foley catheter. Unsuccessful. Foreskin is unable to be removed from the penile head. Meatus not visualized. Dr Kerry Hough aware.

## 2014-06-23 NOTE — ED Notes (Signed)
Patient more lethargic, increased work of breathing. Dr Kerry Hough made aware of change in patient status, decreased pulse ox to 87% and need for increased O2 via [place]. Oxygen increased to 3 l/min. Verbal orders for 40 mg lasix IV, foley catheter, and ABG. RT called for ABG.

## 2014-06-23 NOTE — H&P (Signed)
[facility] History and Physical  Denzil Hughes. EXB:[id number] DOB: 04-30-1941 DOA: 06/23/2014  Referring physician: Dr. Hyacinth Meeker, ER physician PCP: [name], MD   Chief Complaint: abdominal discomfort  HPI: Patrick [name]. is a 73 y.o. male with a history of chronic systolic congestive heart failure and ejection fraction of 20-25%. At the time of my exam, patient is lethargic and cannot participate in history. History is obtained from his daughter as well as the ER record. Per notes, patient began feeling nausea and vomiting on the day prior to admission. He felt like he was having heartburn and was taking Alka-Seltzer. The following day, he had increased weakness, fell transferring from bed to chair, and was somewhat lethargic. His family also noted that he had increased work of breathing. It is unclear whether he had any chest pain. His daughter reports that she had noticed some swelling in his lower extremities. She reports that he often does not take his diuretics as prescribed since they make him urinate. She reports that he is usually compliant with his diet. When he was brought to the emergency room he was initially complaining of some abdominal discomfort and dark colored stools. CT scan of the abdomen and pelvis did not show any significant findings of a large stool burden. Rectal exam indicated heme positive stools, but it was also reported that patient is taking iron supplements and his hemoglobin was found to be near baseline. During his ER course, his respiratory status began to significantly decline. He required supplemental oxygen. Mental status also began to decline he became increasingly lethargic. Blood gas indicated an elevated PCO2 as well as lower PO2. His CT of the abdomen and pelvis indicated a large right pleural effusion as well as possible left pleural effusion. He was noted to be volume overloaded with anasarca. Patient is being admitted to the hospital for further  treatments   Review of Systems:  Unable to assess due to mental status.   Past Medical History  Diagnosis Date  . Hypertension   . Hyperlipidemia   . Coronary artery disease     lbbb  . PAD (peripheral artery disease)   . Diabetes mellitus     poorly controlled  . Anxiety   . Tobacco abuse   . Peripheral vascular disease     with prior stents placed bilaterally  . MI (myocardial infarction) 2000  . Anemia   . COPD exacerbation   . Acute renal failure     due to ATN versus hypovolemia  . Acute exacerbation of CHF (congestive heart failure)     EF 25%,  . COPD (chronic obstructive pulmonary disease)    Past Surgical History  Procedure Laterality Date  . Coronary artery bypass graft      x4  . Stents in legs  03/2010    [facility]  . Cholecystectomy    . Cardiac catheterization  2010    [place]  . Cataract extraction    . Cataract extraction, bilateral  2013  . Iliac artery stent    . External iliac      stent, left    Social History:  reports that he has been smoking Cigarettes.  He has a 15 pack-year smoking history. He has never used smokeless tobacco. He reports that he does not drink alcohol or use illicit drugs.  Allergies  Allergen Reactions  . Amoxicillin     Sick on stomach    Family History  Problem Relation Age  of Onset  . Heart attack Father   . Stroke Mother      Prior to Admission medications   Medication Sig Start Date End Date Taking? Authorizing Provider  aspirin 81 MG tablet Take 81 mg by mouth daily.   Yes Historical Provider, MD  aspirin-sod bicarb-citric acid (ALKA-SELTZER) 325 MG TBEF tablet Take 325 mg by mouth every 6 (six) hours as needed (stomach).   Yes Historical Provider, MD  B-D UF III MINI PEN NEEDLES 31G X 5 MM MISC USE TWICE DAILY AS DIRECTED   Yes Sherlene Shams, MD  carvedilol (COREG) 12.5 MG tablet TAKE 1 TABLET BY MOUTH TWICE DAILY WITH A MEAL 07/17/13  Yes Sherlene Shams, MD  dimenhyDRINATE  (DRAMAMINE) 50 MG tablet Take 50 mg by mouth every 8 (eight) hours as needed for nausea.    Yes Historical Provider, MD  esomeprazole (NEXIUM) 40 MG capsule Take 1 capsule (40 mg total) by mouth 2 (two) times daily. 07/10/13  Yes Sherlene Shams, MD  ferrous sulfate 325 (65 FE) MG tablet Take 1 tablet twice daily with a meal. 07/15/13  Yes Sherlene Shams, MD  gabapentin (NEURONTIN) 300 MG capsule TAKE ONE CAPSULE BY MOUTH EVERY DAY 06/17/14  Yes Sherlene Shams, MD  insulin glargine (LANTUS) 100 UNIT/ML injection Inject 10-12 Units into the skin 2 (two) times daily.  02/27/14  Yes Sherlene Shams, MD  Insulin Syringe-Needle U-100 (INSULIN SYRINGE 1CC/30GX5/16") 30G X 5/16" 1 ML MISC Use as directed 01/21/14  Yes Sherlene Shams, MD  ipratropium-albuterol (DUONEB) 0.5-2.5 (3) MG/3ML SOLN Take 3 mLs by nebulization every 4 (four) hours as needed (shortness of breath).    Yes Historical Provider, MD  nitroGLYCERIN (NITROSTAT) 0.4 MG SL tablet Place 1 tablet (0.4 mg total) under the tongue every 5 (five) minutes as needed. 02/22/13  Yes Raquel Conni Elliot, NP  OxyCODONE HCl ER (OXYCONTIN) 60 MG T12A Take 1 tablet by mouth every 12 (twelve) hours.   Yes Historical Provider, MD  oxyCODONE-acetaminophen (PERCOCET/ROXICET) 5-325 MG per tablet Take 1 tablet by mouth every 4 (four) hours as needed for moderate pain or severe pain.   Yes Historical Provider, MD  simvastatin (ZOCOR) 40 MG tablet TAKE 1 TABLET BY MOUTH EVERY NIGHT AT BEDTIME FOR CHOLESTEROL 05/02/14  Yes Sherlene Shams, MD  SPIRIVA HANDIHALER 18 MCG inhalation capsule INHALE CONTENTS OF 1 CAPSULE ONCE DAILY USING HANDIHALER AT 8AM 07/16/13  Yes Sherlene Shams, MD  torsemide (DEMADEX) 20 MG tablet Take 20-40 mg by mouth daily.   Yes Historical Provider, MD  TDaP (BOOSTRIX) 5-2.5-18.5 LF-MCG/0.5 injection Inject 0.5 mLs into the muscle once. 07/10/13   Sherlene Shams, MD  zoster vaccine live, PF, (ZOSTAVAX) 16109 UNT/0.65ML injection Inject 19,400 Units into the skin  once. 07/10/13   Sherlene Shams, MD   Physical Exam: Filed Vitals:   06/23/14 1600 06/23/14 1700 06/23/14 1723 06/23/14 1800  BP: 129/79 111/71 111/71   Pulse:   95 86  Temp:      TempSrc:      Resp: Height:      Weight:      SpO2:    96%    Wt Readings from Last 3 Encounters:  06/23/14 72.576 kg (160 lb)  03/11/14 74.73 kg (164 lb 12 oz)  02/27/14 75.751 kg (167 lb)    General:  Lethargic, increased respiratory effort Eyes: PERRL, normal lids, irises & conjunctiva ENT: grossly normal hearing,  lips & tongue Neck: no LAD, masses or thyromegaly Cardiovascular: diminished breath sounds at the bases with crackles. 1-2+ LE edema. Telemetry: SR, no arrhythmias  Respiratory: CTA bilaterally, no w/r/r. Normal respiratory effort. Abdomen: soft, distended, bs+, NT Skin: venous stasis changes seen in RLE Musculoskeletal: LLE BKA Psychiatric: lethargic Neurologic: lethargic, unable to participate with exam.  Spontaneously moving upper extremities. Grossly appears to be intact          Labs on Admission:  Basic Metabolic Panel:  Recent Labs Lab 06/23/14 1304  NA 142  K 3.8  CL 98  CO2 35*  GLUCOSE 158*  BUN 20  CREATININE 1.24  CALCIUM 8.6   Liver Function Tests:  Recent Labs Lab 06/23/14 1304  AST 36  ALT 14  ALKPHOS 89  BILITOT 0.5  PROT 6.7  ALBUMIN 2.5*    Recent Labs Lab 06/23/14 1304  LIPASE 10*   No results found for this basename: AMMONIA,  in the last 168 hours CBC:  Recent Labs Lab 06/23/14 1304  WBC 5.0  NEUTROABS 3.7  HGB 11.5*  HCT 38.2*  MCV 84.7  PLT 276   Cardiac Enzymes:  Recent Labs Lab 06/23/14 1304  TROPONINI <0.30    BNP (last 3 results)  Recent Labs  10/25/13 1529  PROBNP 16109*   CBG: No results found for this basename: GLUCAP,  in the last 168 hours  Radiological Exams on Admission: Ct Abdomen Pelvis Wo Contrast  06/23/2014   CLINICAL DATA:  Two days of abdominal pain and generalized weakness.  Patient reports melena  EXAM: CT ABDOMEN AND PELVIS WITHOUT CONTRAST  TECHNIQUE: Multidetector CT imaging of the abdomen and pelvis was performed following the standard protocol without IV contrast. The patient ingested only a small amount of oral contrast.  COMPARISON:  Portable chest x-ray of today's date  FINDINGS: There is a large right pleural effusion with small left pleural effusion. There is mild atelectasis of the right lower lobe adjacent to the effusion. The cardiac chambers are mildly enlarged.  The stomach is partially distended with nonopacified fluid. There is no evidence of a small bowel obstruction. Contrast has reached the colon. The stool and gas pattern is unremarkable. There is sigmoid diverticulosis. A moderate amount of stool in the rectal vault is demonstrated. There is no free extraluminal fluid or gas. The perisigmoid soft tissues exhibit no significant significant inflammatory changes.  The gallbladder is surgically absent. The liver, spleen, pancreas, adrenal glands, and kidneys exhibit no acute abnormalities. There at rounded exophytic hypodensities associated with the mid and upper poles of the right kidney which exhibit Hounsfield value is of between 4 in 17. There are no calcified urinary tract stones. The abdominal aorta exhibits dense mural calcification but no aneurysm. There is no periaortic or pericaval lymphadenopathy.  The urinary bladder, prostate gland, and seminal vesicles exhibit no acute abnormalities.  There is diffusely increased density in the subcutaneous fat consistent with anasarca. There is degenerative disc space narrowing at L4-5. There is no compression fracture. The bony pelvis exhibits no acute abnormalities.  IMPRESSION: 1. There is no evidence of bowel obstruction, enteritis, or acute colitis. No mural mass is demonstrated. There is sigmoid diverticulosis without definite evidence of acute diverticulitis. Certainly low-grade diverticulitis could be present  without CT findings. There may be a fecal impaction given the moderately increased volume of stool in the rectum. 2. There is no acute hepatobiliary nor acute urinary tract abnormality. There are renal cysts on the right. 3. There is  a large right pleural effusion with smaller left pleural effusion of uncertain etiology. There is diffusely increased density in the subcutaneous fat consistent with anasarca. There is enlargement of the cardiac chambers.   Electronically Signed   By: David  [place]   On: 06/23/2014 16:14   Dg Chest Port 1 View  06/23/2014   CLINICAL DATA:  Shortness of breath.  Weakness.  EXAM: PORTABLE CHEST - 1 VIEW  COMPARISON:  None.  FINDINGS: Cardiomegaly. Status post median sternotomy. Elevation of the right hemidiaphragm. Consolidative opacity within the right lower hemi thorax. Extensive perihilar interstitial pulmonary opacities. No definite pneumothorax.  IMPRESSION: Cardiomegaly.  Consolidative opacity within the right lower hemi thorax may represent elevation of the hemidiaphragm or potentially sub pulmonic effusion.  Perihilar interstitial opacities suggestive of edema in the acute setting.   Electronically Signed   By: Annia Belt M.D.   On: 06/23/2014 13:28    EKG: Independently reviewed. RBBB with LAFB, no prior EKG for comparison  Assessment/Plan Active Problems:   DM type 2 causing CKD stage 3   Acute on chronic systolic CHF (congestive heart failure), NYHA class 3   Dark stools   COPD (chronic obstructive pulmonary disease)   CHF (congestive heart failure)   Acute respiratory failure   Pleural effusion   Acute encephalopathy   Acute systolic CHF (congestive heart failure)   1. Acute respiratory failure with hypercapnia. Patient was placed on BiPAP. His respiratory failure is likely related to congestive heart failure and pleural effusions. We'll try to wean down oxygen/BiPAP as tolerated. 2. Acute on chronic systolic congestive heart failure. Start the patient  on Lasix 80 mg IV twice a day. 2-D echocardiogram will be ordered. We'll continue him on Coreg, but at a lower dose his blood pressure is marginal. Hold off on ACE inhibitor at this time. We'll request cardiology consultation. 3. Pleural effusion. Likely related to congestive heart failure. We'll request an ultrasound-guided thoracentesis to be done the morning. 4. COPD. Are stable at this time. He does not have any evidence of wheezing. Continue supportive therapy. 5. Acute metabolic encephalopathy. Likely related to hypoxia/hypercapnia. Continue to follow. Hold narcotics. 6. Heme positive stools. Possibly related to iron supplementation versus constipation. His hemoglobin appears to be at baseline should be followed. We'll continue a proton pump inhibitors. We'll hold off on GI evaluation considering his respiratory status is unstable. We'll need to consider more urgent GI evaluation if he develops frank bleeding. 7. Diabetes. Patient is currently n.p.o. due to respiratory status. Continue on sliding scale insulin. Hold basal insulin for now. 8. CKD stage III. Creatinine appears to be at baseline. Continue to follow in setting of diuresis.  Code Status: DNR DVT Prophylaxis: scds for now, can consider starting heparin after thoracentesis Family Communication: discussed with daughter over the phone Disposition Plan: pending hospital course  Time spent:  [facility] Pager [phone]

## 2014-06-23 NOTE — ED Notes (Signed)
Report given to [facility] ICU. Ready to receive patient.  Critical PCO2 given to Dr Kerry Hough. Bipap ordered. MD ok with patient being placed on bipap upon arrival to ICU since patient is ready to be transferred. RT notified.

## 2014-06-23 NOTE — ED Notes (Signed)
Patient arrives via EMS from home with c/o abdominal pain and generalized weakness x 2 days. Pale. Alert/oriented x 4. Found by EMS outside smoking a cigarette.

## 2014-06-23 NOTE — ED Notes (Signed)
Oxygen saturations now 99 % on 3 L/[place]

## 2014-06-24 ENCOUNTER — Inpatient Hospital Stay (HOSPITAL_COMMUNITY): Payer: Medicare Other

## 2014-06-24 DIAGNOSIS — I059 Rheumatic mitral valve disease, unspecified: Secondary | ICD-10-CM

## 2014-06-24 DIAGNOSIS — J90 Pleural effusion, not elsewhere classified: Secondary | ICD-10-CM

## 2014-06-24 DIAGNOSIS — R339 Retention of urine, unspecified: Secondary | ICD-10-CM | POA: Diagnosis present

## 2014-06-24 LAB — TROPONIN I: Troponin I: <0.3 ng/mL (ref <0.30)

## 2014-06-24 LAB — BASIC METABOLIC PANEL
ANION GAP: 11 (ref 5–15)
BUN: 24 mg/dL — ABNORMAL HIGH (ref 6–23)
CALCIUM: 8.6 mg/dL (ref 8.4–10.5)
CHLORIDE: 96 meq/L (ref 96–112)
CO2: 35 mEq/L — ABNORMAL HIGH (ref 19–32)
Creatinine, Ser: 1.62 mg/dL — ABNORMAL HIGH (ref 0.50–1.35)
GFR calc non Af Amer: 41 mL/min — ABNORMAL LOW (ref >90)
GFR, EST AFRICAN AMERICAN: 48 mL/min — AB (ref >90)
Glucose, Bld: 131 mg/dL — ABNORMAL HIGH (ref 70–99)
Potassium: 3.8 mEq/L (ref 3.7–5.3)
Sodium: 142 mEq/L (ref 137–147)

## 2014-06-24 LAB — GLUCOSE, CAPILLARY
GLUCOSE-CAPILLARY: 99 mg/dL (ref 70–99)
GLUCOSE-CAPILLARY: 103 mg/dL — AB (ref 70–99)
Glucose-Capillary: 103 mg/dL — ABNORMAL HIGH (ref 70–99)
Glucose-Capillary: 106 mg/dL — ABNORMAL HIGH (ref 70–99)
Glucose-Capillary: 123 mg/dL — ABNORMAL HIGH (ref 70–99)
Glucose-Capillary: 199 mg/dL — ABNORMAL HIGH (ref 70–99)

## 2014-06-24 LAB — BODY FLUID CELL COUNT WITH DIFFERENTIAL
Eos, Fluid: 8 %
LYMPHS FL: 21 %
Monocyte-Macrophage-Serous Fluid: 53 % (ref 50–90)
NEUTROPHIL FLUID: 17 % (ref 0–25)
Other Cells, Fluid: 1 %
Total Nucleated Cell Count, Fluid: 304 cu mm (ref 0–1000)

## 2014-06-24 LAB — CBC
HCT: 44 % (ref 39.0–52.0)
Hemoglobin: 13 g/dL (ref 13.0–17.0)
MCH: 25.3 pg — ABNORMAL LOW (ref 26.0–34.0)
MCHC: 29.5 g/dL — ABNORMAL LOW (ref 30.0–36.0)
MCV: 85.6 fL (ref 78.0–100.0)
Platelets: 289 10*3/uL (ref 150–400)
RBC: 5.14 MIL/uL (ref 4.22–5.81)
RDW: 16.3 % — ABNORMAL HIGH (ref 11.5–15.5)
WBC: 8.4 10*3/uL (ref 4.0–10.5)

## 2014-06-24 LAB — BLOOD GAS, ARTERIAL
Acid-Base Excess: 7.2 mmol/L — ABNORMAL HIGH (ref 0.0–2.0)
Bicarbonate: 33.7 mEq/L — ABNORMAL HIGH (ref 20.0–24.0)
Delivery systems: POSITIVE
EXPIRATORY PAP: 6
Inspiratory PAP: 10
O2 Content: 3 L/min
O2 Saturation: 95.1 %
PATIENT TEMPERATURE: 37
PCO2 ART: 74.8 mmHg — AB (ref 35.0–45.0)
RATE: 12 resp/min
TCO2: 31.6 mmol/L (ref 0–100)
pH, Arterial: 7.277 — ABNORMAL LOW (ref 7.350–7.450)
pO2, Arterial: 92.9 mmHg (ref 80.0–100.0)

## 2014-06-24 LAB — PROTEIN, BODY FLUID: Total protein, fluid: 2.4 g/dL

## 2014-06-24 LAB — TSH: TSH: 4.29 u[IU]/mL (ref 0.350–4.500)

## 2014-06-24 LAB — HEMOGLOBIN A1C
Hgb A1c MFr Bld: 8.6 % — ABNORMAL HIGH (ref <5.7)
Mean Plasma Glucose: 200 mg/dL — ABNORMAL HIGH (ref <117)

## 2014-06-24 MED ORDER — OXYCODONE HCL ER 15 MG PO T12A
15.0000 mg | EXTENDED_RELEASE_TABLET | Freq: Two times a day (BID) | ORAL | Status: DC
  Administered 2014-06-24 – 2014-06-26 (×5): 15 mg via ORAL
  Filled 2014-06-24 (×5): qty 1

## 2014-06-24 MED ORDER — CARVEDILOL 12.5 MG PO TABS
12.5000 mg | ORAL_TABLET | Freq: Two times a day (BID) | ORAL | Status: DC
  Administered 2014-06-24 – 2014-06-27 (×6): 12.5 mg via ORAL
  Filled 2014-06-24 (×9): qty 1

## 2014-06-24 MED ORDER — FUROSEMIDE 10 MG/ML IJ SOLN
80.0000 mg | Freq: Two times a day (BID) | INTRAMUSCULAR | Status: DC
  Administered 2014-06-24: 80 mg via INTRAVENOUS
  Administered 2014-06-25: 40 mg via INTRAVENOUS
  Filled 2014-06-24 (×2): qty 8

## 2014-06-24 MED ORDER — OXYCODONE-ACETAMINOPHEN 5-325 MG PO TABS
1.0000 | ORAL_TABLET | ORAL | Status: DC | PRN
  Administered 2014-06-24 – 2014-06-27 (×9): 1 via ORAL
  Filled 2014-06-24 (×9): qty 1

## 2014-06-24 NOTE — Procedures (Signed)
PreOperative Dx: RIGHT pleural effusion Postoperative Dx: RIGHT pleural effusion Procedure:   US guided RIGHT thoracentesis Radiologist:  Tyron Russell Anesthesia:  8 ml of 1% lidocaine Specimen:  1700 ml of clear yellow colored fluid EBL:   < 1 ml Complications: None

## 2014-06-24 NOTE — Progress Notes (Signed)
Change BiPAP to v60 , PT still confused but arousing 12/5 f 10 28 oxygen. Will montior PT asking for water

## 2014-06-24 NOTE — Progress Notes (Addendum)
Patient remains stable off BiPAP with moderate increased WOB but can speak in full sentences. Oxygenation stable.  Discussed recs for thoracentesis with patient and daughter--understood rationale for procedure and accepted.  Discussed with cardiology Dr. Larwance Sachs recommends transfer to [facility] and doesn't think requires heart failure team at this point. No SDU beds at [facility].  Discussed need for catheter placement via urology and transfer--both fine with transfer to [facility].  Discussed with Dr. [name] urology--will see in consult for catheter placement.  Transfer to [facility], team 5 Dr. Izola Price accepting. Remain NPO PRN BiPAP Monitor CBC--dark stool likely from iron. No evidence of GIB.   Brendia Sacks, MD [facility] [phone]

## 2014-06-24 NOTE — Progress Notes (Signed)
Re-scan of patient with bladder scanner : 607 mL Physician is aware of the inability of the patient to be urinary cathed.

## 2014-06-24 NOTE — Progress Notes (Signed)
  Echocardiogram 2D Echocardiogram has been performed.  Jerrilyn Messinger 06/24/2014, 4:05 PM

## 2014-06-24 NOTE — Progress Notes (Signed)
page for advising Dav'id of Patients status. Pt bladder scan at midnight showed 440 mL. No further orders or interventions ordered.

## 2014-06-24 NOTE — Progress Notes (Signed)
Report called to Ruby at [facility] ICU. Pt to leave with [facility]. Daughter at bedside and informed of transfer.  Pt refused lasix dose d/t feeling bladder distension. Still unable to void. Bladder scan showed 864 mL.  Pharmacy notified and will move 1800 dose to 2200.

## 2014-06-24 NOTE — Progress Notes (Signed)
Thoracentesis complete no signs of distress. 1700 ml yellow colored pleural fluid removed.  

## 2014-06-24 NOTE — Consult Note (Signed)
Consult: Urinary retention, difficult Foley  Requested by: Dr. Izola Price  History of Present Illness: 73 year old admitted with CHF and anasarca. He is undergoing diuresis and noted to be in urinary retention today. I spoke with the patient and his daughter. They deny any prior history of prostate issues, prostatectomy. He typically voids with a good stream. Bladder scans range from 600 800 cc. He has a condom catheter in place with only about 100 cc in the bag.    Past Medical History  Diagnosis Date  . Hypertension   . Hyperlipidemia   . Coronary artery disease     lbbb  . PAD (peripheral artery disease)   . Diabetes mellitus     poorly controlled  . Anxiety   . Tobacco abuse   . Peripheral vascular disease     with prior stents placed bilaterally  . MI (myocardial infarction) 2000  . Anemia   . COPD exacerbation   . Acute renal failure     due to ATN versus hypovolemia  . Acute exacerbation of CHF (congestive heart failure)     EF 25%,  . COPD (chronic obstructive pulmonary disease)    Past Surgical History  Procedure Laterality Date  . Coronary artery bypass graft      x4  . Stents in legs  03/2010    [place] [facility]  . Cholecystectomy    . Cardiac catheterization  2010    [place]  . Cataract extraction    . Cataract extraction, bilateral  2013  . Iliac artery stent    . External iliac      stent, left     Home Medications:  Prescriptions prior to admission  Medication Sig Dispense Refill  . aspirin 81 MG tablet Take 81 mg by mouth daily.      Marland Kitchen aspirin-sod bicarb-citric acid (ALKA-SELTZER) 325 MG TBEF tablet Take 325 mg by mouth every 6 (six) hours as needed (stomach).      . B-D UF III MINI PEN NEEDLES 31G X 5 MM MISC USE TWICE DAILY AS DIRECTED  100 each  2  . carvedilol (COREG) 12.5 MG tablet TAKE 1 TABLET BY MOUTH TWICE DAILY WITH A MEAL  60 tablet  5  . dimenhyDRINATE (DRAMAMINE) 50 MG tablet Take 50 mg by mouth every 8 (eight) hours as needed  for nausea.       Marland Kitchen esomeprazole (NEXIUM) 40 MG capsule Take 1 capsule (40 mg total) by mouth 2 (two) times daily.  180 capsule  0  . ferrous sulfate 325 (65 FE) MG tablet Take 1 tablet twice daily with a meal.      . gabapentin (NEURONTIN) 300 MG capsule TAKE ONE CAPSULE BY MOUTH EVERY DAY  30 capsule  0  . insulin glargine (LANTUS) 100 UNIT/ML injection Inject 10-12 Units into the skin 2 (two) times daily.       . Insulin Syringe-Needle U-100 (INSULIN SYRINGE 1CC/30GX5/16") 30G X 5/16" 1 ML MISC Use as directed  100 each  5  . ipratropium-albuterol (DUONEB) 0.5-2.5 (3) MG/3ML SOLN Take 3 mLs by nebulization every 4 (four) hours as needed (shortness of breath).       . nitroGLYCERIN (NITROSTAT) 0.4 MG SL tablet Place 1 tablet (0.4 mg total) under the tongue every 5 (five) minutes as needed.  30 tablet  6  . OxyCODONE HCl ER (OXYCONTIN) 60 MG T12A Take 1 tablet by mouth every 12 (twelve) hours.      Marland Kitchen oxyCODONE-acetaminophen (PERCOCET/ROXICET) 5-325  MG per tablet Take 1 tablet by mouth every 4 (four) hours as needed for moderate pain or severe pain.      . simvastatin (ZOCOR) 40 MG tablet TAKE 1 TABLET BY MOUTH EVERY NIGHT AT BEDTIME FOR CHOLESTEROL  30 tablet  0  . SPIRIVA HANDIHALER 18 MCG inhalation capsule INHALE CONTENTS OF 1 CAPSULE ONCE DAILY USING HANDIHALER AT 8AM  30 capsule  5  . torsemide (DEMADEX) 20 MG tablet Take 20-40 mg by mouth daily.      . TDaP (BOOSTRIX) 5-2.5-18.5 LF-MCG/0.5 injection Inject 0.5 mLs into the muscle once.  0.5 mL  0  . zoster vaccine live, PF, (ZOSTAVAX) 95621 UNT/0.65ML injection Inject 19,400 Units into the skin once.  1 each  0   Allergies:  Allergies  Allergen Reactions  . Amoxicillin     Sick on stomach    Family History  Problem Relation Age of Onset  . Heart attack Father   . Stroke Mother    Social History:  reports that he has been smoking Cigarettes.  He has a 15 pack-year smoking history. He has never used smokeless tobacco. He reports that  he does not drink alcohol or use illicit drugs.  ROS: A complete review of systems was performed.  All systems are negative except for pertinent findings as noted. Review of Systems  All other systems reviewed and are negative.    Physical Exam:  Vital signs in last 24 hours: Temp:  [96.5 F (35.8 C)-98 F (36.7 C)] 97.7 F (36.5 C) (09/21 1824) Pulse Rate:  [41-132] 92 (09/21 1634) Resp:  [10-20] 17 (09/21 1819) BP: (110-173)/(48-95) 118/48 mmHg (09/21 1819) SpO2:  [65 %-100 %] 65 % (09/21 1600) Weight:  [76.4 kg (168 lb 6.9 oz)-79.4 kg (175 lb 0.7 oz)] 76.4 kg (168 lb 6.9 oz) (09/21 1824) General:  Alert and oriented, No acute distress HEENT: Normocephalic, atraumatic Cardiovascular: Regular rate and rhythm Lungs: Regular rate and effort Abdomen: Soft, nontender, nondistended, no abdominal masses, bladder mildly distended on exam. Back: No CVA tenderness Extremities: No edema Neurologic: Grossly intact GU: Phimosis. No palpable masses the foreskin or glans penis.  Procedure: The foreskin was prepped with Betadine. The glans penis was grasped firmly an 47 [place] coud catheter was guided through the meatus. There was resistance through the fossa navicularis likely from some stricturing. Foley advanced without difficulty from here. It was inserted all the way, the balloon inflated and seated at the bladder neck. About 700 cc of clear urine was drained.  Laboratory Data:  Results for orders placed during the hospital encounter of 06/23/14 (from the past 24 hour(s))  GLUCOSE, CAPILLARY     Status: Abnormal   Collection Time    06/23/14  9:28 PM      Result Value Ref Range   Glucose-Capillary 142 (*) 70 - 99 mg/dL   Comment 1 Notify RN    TROPONIN I     Status: None   Collection Time    06/23/14  9:52 PM      Result Value Ref Range   Troponin I <0.30  <0.30 ng/mL  TSH     Status: None   Collection Time    06/23/14  9:52 PM      Result Value Ref Range   TSH 4.290  0.350 -  4.500 uIU/mL  PRO B NATRIURETIC PEPTIDE     Status: Abnormal   Collection Time    06/23/14  9:52 PM      Result  Value Ref Range   Pro B Natriuretic peptide (BNP) 37369.0 (*) 0 - 125 pg/mL  HEMOGLOBIN A1C     Status: Abnormal   Collection Time    06/23/14  9:52 PM      Result Value Ref Range   Hemoglobin A1C 8.6 (*) <5.7 %   Mean Plasma Glucose 200 (*) <117 mg/dL  GLUCOSE, CAPILLARY     Status: Abnormal   Collection Time    06/24/14 12:06 AM      Result Value Ref Range   Glucose-Capillary 199 (*) 70 - 99 mg/dL   Comment 1 Notify RN    BLOOD GAS, ARTERIAL     Status: Abnormal   Collection Time    06/24/14  2:31 AM      Result Value Ref Range   O2 Content 3.0     Delivery systems BILEVEL POSITIVE AIRWAY PRESSURE     Mode OXYGEN MASK     Rate 12     Inspiratory PAP 10.0     Expiratory PAP 6.0     pH, Arterial 7.277 (*) 7.350 - 7.450   pCO2 arterial 74.8 (*) 35.0 - 45.0 mmHg   pO2, Arterial 92.9  80.0 - 100.0 mmHg   Bicarbonate 33.7 (*) 20.0 - 24.0 mEq/L   TCO2 31.6  0 - 100 mmol/L   Acid-Base Excess 7.2 (*) 0.0 - 2.0 mmol/L   O2 Saturation 95.1     Patient temperature 37.0     Collection site RADIAL     Drawn by COLLECTED BY RT     Sample type ARTERIAL     Allens test (pass/fail) PASS  PASS  BASIC METABOLIC PANEL     Status: Abnormal   Collection Time    06/24/14  3:02 AM      Result Value Ref Range   Sodium 142  137 - 147 mEq/L   Potassium 3.8  3.7 - 5.3 mEq/L   Chloride 96  96 - 112 mEq/L   CO2 35 (*) 19 - 32 mEq/L   Glucose, Bld 131 (*) 70 - 99 mg/dL   BUN 24 (*) 6 - 23 mg/dL   Creatinine, Ser 9.62 (*) 0.50 - 1.35 mg/dL   Calcium 8.6  8.4 - 95.2 mg/dL   GFR calc non Af Amer 41 (*) >90 mL/min   GFR calc Af Amer 48 (*) >90 mL/min   Anion gap 11  5 - 15  TROPONIN I     Status: None   Collection Time    06/24/14  3:02 AM      Result Value Ref Range   Troponin I <0.30  <0.30 ng/mL  GLUCOSE, CAPILLARY     Status: Abnormal   Collection Time    06/24/14  3:47 AM       Result Value Ref Range   Glucose-Capillary 123 (*) 70 - 99 mg/dL   Comment 1 Notify RN    GLUCOSE, CAPILLARY     Status: None   Collection Time    06/24/14  7:49 AM      Result Value Ref Range   Glucose-Capillary 99  70 - 99 mg/dL   Comment 1 Documented in Chart     Comment 2 Notify RN    TROPONIN I     Status: None   Collection Time    06/24/14  8:36 AM      Result Value Ref Range   Troponin I <0.30  <0.30 ng/mL  CBC     Status:  Abnormal   Collection Time    06/24/14 11:18 AM      Result Value Ref Range   WBC 8.4  4.0 - 10.5 K/uL   RBC 5.14  4.22 - 5.81 MIL/uL   Hemoglobin 13.0  13.0 - 17.0 g/dL   HCT 09.8  11.9 - 14.7 %   MCV 85.6  78.0 - 100.0 fL   MCH 25.3 (*) 26.0 - 34.0 pg   MCHC 29.5 (*) 30.0 - 36.0 g/dL   RDW 82.9 (*) 56.2 - 13.0 %   Platelets 289  150 - 400 K/uL  BODY FLUID CELL COUNT WITH DIFFERENTIAL     Status: None   Collection Time    06/24/14 12:15 PM      Result Value Ref Range   Fluid Type-FCT FLUID     Color, Fluid YELLOW  YELLOW   Appearance, Fluid CLEAR  CLEAR   WBC, Fluid 304  0 - 1000 cu mm   Neutrophil Count, Fluid 17  0 - 25 %   Lymphs, Fluid 21     Monocyte-Macrophage-Serous Fluid 53  50 - 90 %   Eos, Fluid 8     Other Cells, Fluid 1    PROTEIN, BODY FLUID     Status: None   Collection Time    06/24/14 12:15 PM      Result Value Ref Range   Total protein, fluid 2.4     Fluid Type-FTP FLUID    GLUCOSE, CAPILLARY     Status: Abnormal   Collection Time    06/24/14 12:37 PM      Result Value Ref Range   Glucose-Capillary 103 (*) 70 - 99 mg/dL   Comment 1 Documented in Chart     Comment 2 Notify RN    GLUCOSE, CAPILLARY     Status: Abnormal   Collection Time    06/24/14  4:25 PM      Result Value Ref Range   Glucose-Capillary 103 (*) 70 - 99 mg/dL   Comment 1 Documented in Chart     Comment 2 Notify RN    GLUCOSE, CAPILLARY     Status: Abnormal   Collection Time    06/24/14  7:10 PM      Result Value Ref Range    Glucose-Capillary 106 (*) 70 - 99 mg/dL   Comment 1 Documented in Chart     Comment 2 Notify RN     Recent Results (from the past 240 hour(s))  MRSA PCR SCREENING     Status: None   Collection Time    06/23/14  3:10 PM      Result Value Ref Range Status   MRSA by PCR NEGATIVE  NEGATIVE Final   Comment:            The GeneXpert MRSA Assay (FDA     approved for NASAL specimens     only), is one component of a     comprehensive MRSA colonization     surveillance program. It is not     intended to diagnose MRSA     infection nor to guide or     monitor treatment for     MRSA infections.   Creatinine:  Recent Labs  06/23/14 1304 06/24/14 0302  CREATININE 1.24 1.62*    Impression/Assessment/plan: Phimosis, possible urethral stricture, urinary retention-continue Foley catheter until diuresis is complete and patient is ambulatory. Please re-consult if necessary or if any concerns. Again, I spoke with patient and daughter and  discussed the findings.   Jerilee Field 06/24/2014, 8:31 PM

## 2014-06-24 NOTE — Progress Notes (Signed)
UR chart review completed.  

## 2014-06-24 NOTE — Progress Notes (Signed)
Called urologist on call.  Debbora Presto, MD  [facility] Pager [phone]  If 7PM-7AM, please contact night-coverage [email or web address] Password [email or web address]

## 2014-06-24 NOTE — Consult Note (Signed)
Attempted Foley placement with a 39 French coud catheter. Patient has a phimosis made it difficult to see the urethral orifice. I was able to cannulate the urethra, but could not placed the catheter past the prostate. No trauma was noted to the urethra. Recommend formal urology consult for placement of Foley catheter.

## 2014-06-24 NOTE — Consult Note (Signed)
CARDIOLOGY CONSULT NOTE   Patient ID: Denzil Hughes. MRN: [id number] DOB/AGE: 1941-04-09 73 y.o.  Admit Date: 06/23/2014 Referring Physician: PTH Primary Physician: [name], MD Consulting Cardiologist: Dina Rich MD Primary Cardiologist Julien Nordmann MD-[place] [facility] Reason for Consultation:Systolic CHF with Anasarca  Clinical Summary Mr. Kimmons is a 73 y.o.male patient with multiple medical problems, with ICM EF of 20-25% per recent echo documented per Dr. Marylou Flesher note of 1.28/2015, admitted with acute on chronic systolic CHF and anasarca, with symptoms of NV, LEE and weakness. ABG demonstrated respiratory acidosis, with hypercapnia with PCO2 of 68.2. He also had black colored stools.    Per H&P as patient is a poor historian, symptoms began as persistent NV X 1 day prior to admission,with associated heartburn symptoms, with increasing weakness and dyspnea He was noted to have worsening swelling in LE per daughter. Also noted to have issues with compliance concerning lasix.   In ER BP 106/88, HR 95 bpm, O2 Sat 98%. Afebrile. CT was negative for bleeding, fecal impaction was noted. Large right pleural effusion with smaller left pleural effusion, anasarca. CXR noted acute edema. He was treated with lasix 40 mg IV, X2 and ASA 324 mg. He was placed on BiPAP.Continues on IV lasix 80 mg BID.  He is undergoing foley placement due to urinary retention which is unsuccessful.. Thoracentesis is also planned for right pleural effusion. There are plans to transfer to [facility].  He has history of smoking who continues to smoke, coronary artery disease, MI in 2000 with CABG at that time, followup cardiac catheterization several years ago at [facility], per Dr. Windell Hummingbird note of 1.28/2015 it was :okay" peripheral vascular disease with history of intervention to the legs in June 2011, history of GI bleed in 2011 requiring bypass fusion, systolic CHF with hospitalization [facility] 2013 . He also had  renal dysfunction likely secondary to long-standing poorly controlled diabetes.   Allergies  Allergen Reactions  . Amoxicillin     Sick on stomach    Medications Scheduled Medications: . aspirin EC  81 mg Oral Daily  . carvedilol  3.125 mg Oral BID WC  . furosemide  80 mg Intravenous BID  . insulin aspart  0-15 Units Subcutaneous 6 times per day  . pantoprazole  40 mg Oral BID  . sodium chloride  3 mL Intravenous Q12H  . tiotropium  18 mcg Inhalation Daily       PRN Medications: sodium chloride, acetaminophen, ipratropium-albuterol, naLOXone (NARCAN)  injection, ondansetron (ZOFRAN) IV, sodium chloride   Past Medical History  Diagnosis Date  . Hypertension   . Hyperlipidemia   . Coronary artery disease     lbbb  . PAD (peripheral artery disease)   . Diabetes mellitus     poorly controlled  . Anxiety   . Tobacco abuse   . Peripheral vascular disease     with prior stents placed bilaterally  . MI (myocardial infarction) 2000  . Anemia   . COPD exacerbation   . Acute renal failure     due to ATN versus hypovolemia  . Acute exacerbation of CHF (congestive heart failure)     EF 25%,  . COPD (chronic obstructive pulmonary disease)     Past Surgical History  Procedure Laterality Date  . Coronary artery bypass graft      x4  . Stents in legs  03/2010    [place] [facility]  . Cholecystectomy    . Cardiac catheterization  2010  [place]  . Cataract extraction    . Cataract extraction, bilateral  2013  . Iliac artery stent    . External iliac      stent, left     Family History  Problem Relation Age of Onset  . Heart attack Father   . Stroke Mother     Social History Mr. Mirarchi reports that he has been smoking Cigarettes.  He has a 15 pack-year smoking history. He has never used smokeless tobacco. Mr. Schramm reports that he does not drink alcohol.  Review of Systems Otherwise reviewed and negative except as outlined.  Physical  Examination Blood pressure 149/95, pulse 89, temperature 97 F (36.1 C), temperature source Axillary, resp. rate 15, height  (1.778 m), weight 175 lb 0.7 oz (79.4 kg), SpO2 99.00%. No intake or output data in the 24 hours ending 06/24/14 0954   GEN: Awake, on BiPAP.  HEENT: Conjunctiva and lids normal, oropharynx clear with moist mucosa. Neck: Supple,JVPto the mandible, unable to auscultate carotid bruits, no thyromegaly. Lungs: Bialteral crackles, absent in the left lower lung,  Cardiac: Regular rate and rhythm, distant heartsounds,  Soft systolic murmur, no pericardial rub. Abdomen: Soft, nontender, no hepatomegaly, bowel sounds present, no guarding or rebound. Extremities: Significant edema to the thights bilaterally with testicular edema with paraphimosis noted. distal pulses diminished with woody skin thickening on the right, AKA on the left.  Skin: Warm and dry. Musculoskeletal: No kyphosis. Neuropsychiatric: Alert and oriented x3, affect grossly appropriate.  Prior Cardiac Testing/Procedures  Echo 2/18/ 2013 Ventricle is mildly dilated. Left ventricular systolic function is severely reduced. EF equals less than 25%. The transmitral spectral Doppler flow pattern is suggestive of impaired LV relaxation. There is moderate to severe global hypokinesis of the left ventricle. The right ventricular systolic function is moderately reduced. The left atrium is mildly dilated. There is mild to moderate mitral regurg. Right ventricular systolic pressure is elevated at 50-60 mm of mercury. Elevated RVSP consistent with moderate pulmonary hypertension.  Lab Results  Basic Metabolic Panel:  Recent Labs Lab 06/23/14 1304 06/24/14 0302  NA 142 142  K 3.8 3.8  CL 98 96  CO2 35* 35*  GLUCOSE 158* 131*  BUN 20 24*  CREATININE 1.24 1.62*  CALCIUM 8.6 8.6    Liver Function Tests:  Recent Labs Lab 06/23/14 1304  AST 36  ALT 14  ALKPHOS 89  BILITOT 0.5  PROT 6.7  ALBUMIN 2.5*     CBC:  Recent Labs Lab 06/23/14 1304  WBC 5.0  NEUTROABS 3.7  HGB 11.5*  HCT 38.2*  MCV 84.7  PLT 276    Cardiac Enzymes:  Recent Labs Lab 06/23/14 1304 06/23/14 2152 06/24/14 0302 06/24/14 0836  TROPONINI <0.30 <0.30 <0.30 <0.30    BNP: 16,109    Radiology: Ct Abdomen Pelvis Wo Contrast  06/23/2014   CLINICAL DATA:  Two days of abdominal pain and generalized weakness. Patient reports melena  EXAM: CT ABDOMEN AND PELVIS WITHOUT CONTRAST  TECHNIQUE: Multidetector CT imaging of the abdomen and pelvis was performed following the standard protocol without IV contrast. The patient ingested only a small amount of oral contrast.  COMPARISON:  Portable chest x-ray of today's date  FINDINGS: There is a large right pleural effusion with small left pleural effusion. There is mild atelectasis of the right lower lobe adjacent to the effusion. The cardiac chambers are mildly enlarged.  The stomach is partially distended with nonopacified fluid. There is no evidence of a small bowel  obstruction. Contrast has reached the colon. The stool and gas pattern is unremarkable. There is sigmoid diverticulosis. A moderate amount of stool in the rectal vault is demonstrated. There is no free extraluminal fluid or gas. The perisigmoid soft tissues exhibit no significant significant inflammatory changes.  The gallbladder is surgically absent. The liver, spleen, pancreas, adrenal glands, and kidneys exhibit no acute abnormalities. There at rounded exophytic hypodensities associated with the mid and upper poles of the right kidney which exhibit Hounsfield value is of between 4 in 17. There are no calcified urinary tract stones. The abdominal aorta exhibits dense mural calcification but no aneurysm. There is no periaortic or pericaval lymphadenopathy.  The urinary bladder, prostate gland, and seminal vesicles exhibit no acute abnormalities.  There is diffusely increased density in the subcutaneous fat consistent  with anasarca. There is degenerative disc space narrowing at L4-5. There is no compression fracture. The bony pelvis exhibits no acute abnormalities.  IMPRESSION: 1. There is no evidence of bowel obstruction, enteritis, or acute colitis. No mural mass is demonstrated. There is sigmoid diverticulosis without definite evidence of acute diverticulitis. Certainly low-grade diverticulitis could be present without CT findings. There may be a fecal impaction given the moderately increased volume of stool in the rectum. 2. There is no acute hepatobiliary nor acute urinary tract abnormality. There are renal cysts on the right. 3. There is a large right pleural effusion with smaller left pleural effusion of uncertain etiology. There is diffusely increased density in the subcutaneous fat consistent with anasarca. There is enlargement of the cardiac chambers.   Electronically Signed   By: David  [place]   On: 06/23/2014 16:14   Dg Chest Port 1 View  06/23/2014   CLINICAL DATA:  Shortness of breath.  Weakness.  EXAM: PORTABLE CHEST - 1 VIEW  COMPARISON:  None.  FINDINGS: Cardiomegaly. Status post median sternotomy. Elevation of the right hemidiaphragm. Consolidative opacity within the right lower hemi thorax. Extensive perihilar interstitial pulmonary opacities. No definite pneumothorax.  IMPRESSION: Cardiomegaly.  Consolidative opacity within the right lower hemi thorax may represent elevation of the hemidiaphragm or potentially sub pulmonic effusion.  Perihilar interstitial opacities suggestive of edema in the acute setting.   Electronically Signed   By: Annia Belt M.D.   On: 06/23/2014 13:28     ECG: Normal sinus rhythm Left axis deviation Right bundle Zoe Nordin block T-wave abnormalities laterally.   Impression and Recommendations  1.Acute on Chronic Systolic CHF: EF of 25% per echo in 2013: Patient with anasarca, with paraphimosis, and urinary retention, not allowing for significant urine output. The patient is  on IV Lasix 80 mg 3 times a day. The patient continues to have significant shortness of breath on BiPAP. Would recommend after transfer to come to the evaluated by CHF team.HE may need milrinone infusion and likely a Lasix drip vs. bolus doses of Lasix. He is tachycardic. Would recommend restarting carvedilol for heart rate control and better cardiac output.. Review of his home medications has enlisted to be on carvedilol 12.5 mg twice a day. Discussed with Dr. Irene Limbo states that he will be also going to OR for a circumcision to allow for catheter insertion in the setting of paraphimosis. Recommend continue beta blocker therapy perioperatively.  2. Bilateral pleural effusions: Thoracentesis planned today  3. COPD: Complicating breathing status in the setting of heart failure. He is currently  on BiPAP, but being weaned to nasal cannula.  4. Urinary Retention: Failed attempt by Dr. Lovell Sheehan for insertion of Foley catheter.  Paraphimosis is major issue, plans to transfer to [facility] for circumcision, and Foley catheter insertion. Bladder scan demonstrated that he had greater than 680 cc per nurse's earlier today. This will significantly influence diuresis.  Signed: Bettey Mare. Lawrence NP  06/24/2014, 9:53 AM Co-Sign MD

## 2014-06-24 NOTE — Progress Notes (Signed)
PROGRESS NOTE  Patrick [name]. ZOX:[id number] DOB: [birth year] DOA: 06/23/2014 PCP: [name], MD Cardiologist Dr. [name]  Summary: 73 year old man with complex past medical history including chronic systolic congestive heart failure, LVEF 20-25% who presented with complaint of abdominal pain and dark stools. Further history of generalized weakness, fall, increasing lethargy and shortness of breath as well as lower extremity edema and noncompliance with diuretics. Workup of abdominal pain was unremarkable except for heme-positive stool but while in the emergency department he developed acute respiratory failure and lethargy he was admitted for acute respiratory acidosis, hypercapnia, large right pleural effusion and volume overload/anasarca. Situation is been complicated by Inability to Pl., Foley catheter and urinary retention.  Assessment/Plan: 1. Acute hypoxic, hypercapnic respiratory failure, respiratory acidosis requiring BiPAP, secondary to CHF and possibly large right pleural effusion clinically stable with oxygenation, patient awake alert and following commands. 2. Acute on chronic systolic CHF LVEF 20-25% with anasarca, precipitating event unclear. Troponins negative. EKG with possible new right bundle. 3. Acute urinary retention with inability to place foley, no h/o GU surgery 4. Large right pleural effusion. Plan thoracentesis today 5. Acute renal failure superimposed on CKD stage III 6. Acute encephalopathy. Hold narcotics. Appears improved.  7. Abdominal pain with dark stools on iron. Moderate stool burden. Consider GI evaluation when stable.  8. Possible fecal impaction. 9. DM with PVD s/p LLE BKA. Capillary blood sugars stable. 10. H/o CAD, COPD 11. Tobacco dependence    Keep in step down unit, the patient's condition is guarded although relatively stable at this point. Treatment of CHF complicated by urinary retention >12 hours and inability to place foley catheter. No urology  coverage available. D/w surgery Dr. Lovell Sheehan who will attempt foley shortly. If unable to place patient will need urgent transfer for urology evaluation.  Continue BiPAP, diuresis, cardiology consult  Thoracentesis attempt for likely symptomatic large right pleural effusion  BMP, CBC in AM  Disimpact with resp status improved  Code Status: DNR DVT prophylaxis: SCD Family Communication: none present Disposition Plan: pending  Brendia Sacks, MD  [facility]  Pager [phone] If 7PM-7AM, please contact night-coverage at [email or web address], password [email or web address] 06/24/2014, 8:51 AM  LOS: 1 day   Consultants:  Cardiology   Procedures:    Antibiotics:    HPI/Subjective: Unable to urinate since 7 PM. Foley could not be placed. On BiPAP all night.  He reports being short of breath. Denies any history of urinary retention or GU surgery.  Objective: Filed Vitals:   06/24/14 0600 06/24/14 0700 06/24/14 0756 06/24/14 0839  BP: 145/68 149/95    Pulse: 82 94  89  Temp:   97 F (36.1 C)   TempSrc:   Axillary   Resp: Height:      Weight:      SpO2: 96% 99%     No intake or output data in the 24 hours ending 06/24/14 0851   Filed Weights   06/23/14 1242 06/24/14 0500  Weight: 72.576 kg (160 lb) 79.4 kg (175 lb 0.7 oz)    Exam:     Afebrile, vital signs are stable. On BiPAP.  General: Appears calm, acutely ill  Psych: Alert. Speech fluent and clear but limited on BiPAP. Follows commands.  Eyes: Pupils equal, round, reactive to light.  CV: Regular rate and rhythm. No murmur, or gallop. No right lower extremity edema. Left BKA. Telemetry sinus rhythm.  Respiratory: Clear to auscultation bilaterally but poor air movement. No frank wheezes, rales or rhonchi.  Moderate increased respiratory effort.  Abdomen: Soft, nontender, soft tissue edema noted  GU: circumcised penis, apparent phimosis, few drops of normal appearing urine dripping out  Skin: Grossly  unremarkable  Musculoskeletal: Moves all extremities to command with grossly normal tone and strength.  Neuro: Grossly nonfocal.  Data Reviewed:  No I/O recorded   Blood sugars stable.  Last arterial pH 7.27/74/92 at 2:30 AM  BUN and creatinine trending up, 24/1.62. Potassium normal.  Troponins negative. BNP on admission [phone]  CT abdomen and pelvis with moderate stool burden, possible fecal impaction, normal appearing urinary structures, large right pleural effusion  EKG showed sinus rhythm with right bundle branch block possibly new and inferior MI (old)  Scheduled Meds: . aspirin EC  81 mg Oral Daily  . carvedilol  3.125 mg Oral BID WC  . furosemide  80 mg Intravenous BID  . insulin aspart  0-15 Units Subcutaneous 6 times per day  . pantoprazole  40 mg Oral BID  . sodium chloride  3 mL Intravenous Q12H  . tiotropium  18 mcg Inhalation Daily   Continuous Infusions:   Principal Problem:   Acute on chronic systolic CHF (congestive heart failure), NYHA class 3 Active Problems:   DM type 2 causing CKD stage 3   Dark stools   COPD (chronic obstructive pulmonary disease)   CHF (congestive heart failure)   Acute respiratory failure   Pleural effusion   Acute encephalopathy   Acute systolic CHF (congestive heart failure)   Urinary retention   Time spent 35 minutes

## 2014-06-24 NOTE — Progress Notes (Signed)
Patient more arouse able , Still on BiPAP, still unable to urinate.

## 2014-06-24 NOTE — Progress Notes (Addendum)
Patient seen and discussed with NP Lyman Bishop, I agree with her documentation. 73 yo male hx of CAD with prior MI and CABG in 2000, PAD with prior interventions, hx of GI bleed, CKD, and chronic systolic HF LVEF <16% by echo.   - 05/2012 Echo LVEF <25%, grade I diastolic dysfunction, moderate RV dysfunction, mild-mod MR. - CXR consolidative process right lower chest, ?effusion vs hemidiaphragm elevation. +pulm edema - EKG SR, nonpspecific IVCD, LAD with RBBB and LAFB, inferior Q-waves - CT A/P: probable sigmoid diverticulosis, large right pleural effusion - Cr 1.24, BUN 20, GFR 57, K 3.8, Hgb 11.5, Plt 276, trop neg x4, pro-BNP 37369 ABG on Bajandas 7.3/69/79--->Bipap 7.277/75/92.9  1. Acute on chronic systolic HF - exacerbating factor likely mixed medication compliance as well as penile/urinary obstruction - significant volume overloaded, diuresis has been limited by urinary/penile obstruction, plans are for surgical placement of catheter. Once catheter placed will be better able to assess and titrate diuretic needs - f/u repeat echo, last study 2013. - continue beta blocker, no ACE/ARB, spironolactone given his renal function - if does not respond to diuretics after catheter placed and lasix uptitrated, may consider at that time having CHF team involved in care for inotrope consideration. Likely now lack of diuresis due to urinary obstruction as opposed to severe low output HF.   2. Hypercapneic respiratory failure - continue bipap, follow closely given most recent gases. Heart failure would predoinately lead to hypoxic failure, and does not explain his degree of hypercapnea. Perhaps decreased ventilation due to COPD, or pleural effusion with lung compression - close monitoring given his acute resp acidosis  3. Pleural effusion - management per primary team, asymmetric effusion less likely to be CHF related. F/u fluid studies.  4. AKI - obstructive, follow renal function after catheter placed.   Dominga Ferry MD

## 2014-06-24 NOTE — Progress Notes (Signed)
INITIAL NUTRITION ASSESSMENT  DOCUMENTATION CODES Per approved criteria  -Not Applicable   INTERVENTION: Recommend Low Sodium diet when pt is appropriate for advancement  When diet is advanced to clears: add ProStat 30 ml TID (each 30 ml provides 100 kcal, 15 gr protein)   Provide low sodium diet education prior to discharge  NUTRITION DIAGNOSIS: Inadequate oral intake related to increased somnolence, decline in respiratory status as evidenced by nausea and vomiting prior to admission and currently NPO.  Goal:  Pt to meet >/= 90% of their estimated nutrition needs    Monitor:  Skin assessments, po intake meals, review labs and wt trends   Reason for Assessment: Low [place]   73 y.o. male  Admitting Dx: Acute on chronic systolic CHF (congestive heart failure), NYHA class 3  ASSESSMENT: pt presents with abdominal pain. CT of abdomen did not indicate large amount of stool. Pt is HOH and doesn't know where his hearing aids are. He is having BiPAP removed and placed on nasal cannula by respiratory staff this morning. Pt being transferred to [facility] for urology evaulation. Pt also for possible thoracentesis due to right large pleural effusion.  Pt says he follows a regular diet at home. He doesn't cook and eats mostly convienience foods such as biscuits (sauage and egg), sandwiches, chips and other snack foods and drinks primarily pepsi. Pt skin is intact. He is at risk for malnutrition and skin breakdown. Labs: BUN and Cr trending up  Height: Ht Readings from Last 1 Encounters:  06/23/14  (1.778 m)    Weight: Wt Readings from Last 1 Encounters:  06/24/14 175 lb 0.7 oz (79.4 kg)    Ideal Body Weight: 166# (75 kg)  % Ideal Body Weight: 105%  Wt Readings from Last 10 Encounters:  06/24/14 175 lb 0.7 oz (79.4 kg)  03/11/14 164 lb 12 oz (74.73 kg)  02/27/14 167 lb (75.751 kg)  10/31/13 179 lb 8 oz (81.421 kg)  10/02/13 174 lb (78.926 kg)  07/10/13 165 lb (74.844 kg)   03/09/13 171 lb (77.565 kg)  02/22/13 165 lb (74.844 kg)  12/22/12 177 lb (80.287 kg)  12/18/12 177 lb 4 oz (80.4 kg)    Usual Body Weight: 165-175#  % Usual Body Weight: 100%  BMI:  Body mass index is 25.12 kg/(m^2). likely skewed due to volume overload  Estimated Nutritional Needs: Kcal: 2100-2300 Protein: 110-130 gr Fluid: 1900 ml daily  Skin: intact  Diet Order: NPO  EDUCATION NEEDS: -Education not appropriate at this time  No intake or output data in the 24 hours ending 06/24/14 1047  Last BM: 9/17  Labs:   Recent Labs Lab 06/23/14 1304 06/24/14 0302  NA 142 142  K 3.8 3.8  CL 98 96  CO2 35* 35*  BUN 20 24*  CREATININE 1.24 1.62*  CALCIUM 8.6 8.6  GLUCOSE 158* 131*    CBG (last 3)   Recent Labs  06/24/14 0006 06/24/14 0347 06/24/14 0749  GLUCAP 199* 123* 99    Scheduled Meds: . aspirin EC  81 mg Oral Daily  . carvedilol  3.125 mg Oral BID WC  . furosemide  80 mg Intravenous BID  . insulin aspart  0-15 Units Subcutaneous 6 times per day  . pantoprazole  40 mg Oral BID  . sodium chloride  3 mL Intravenous Q12H  . tiotropium  18 mcg Inhalation Daily    Continuous Infusions:   Past Medical History  Diagnosis Date  . Hypertension   . Hyperlipidemia   .  Coronary artery disease     lbbb  . PAD (peripheral artery disease)   . Diabetes mellitus     poorly controlled  . Anxiety   . Tobacco abuse   . Peripheral vascular disease     with prior stents placed bilaterally  . MI (myocardial infarction) 2000  . Anemia   . COPD exacerbation   . Acute renal failure     due to ATN versus hypovolemia  . Acute exacerbation of CHF (congestive heart failure)     EF 25%,  . COPD (chronic obstructive pulmonary disease)     Past Surgical History  Procedure Laterality Date  . Coronary artery bypass graft      x4  . Stents in legs  03/2010    [facility]  . Cholecystectomy    . Cardiac catheterization  2010    [place]  .  Cataract extraction    . Cataract extraction, bilateral  2013  . Iliac artery stent    . External iliac      stent, left     Royann Shivers MS,RD,CSG,LDN Office: [phone] Pager: [phone]

## 2014-06-25 DIAGNOSIS — I209 Angina pectoris, unspecified: Secondary | ICD-10-CM

## 2014-06-25 DIAGNOSIS — I10 Essential (primary) hypertension: Secondary | ICD-10-CM

## 2014-06-25 DIAGNOSIS — I25810 Atherosclerosis of coronary artery bypass graft(s) without angina pectoris: Secondary | ICD-10-CM

## 2014-06-25 DIAGNOSIS — Z7189 Other specified counseling: Secondary | ICD-10-CM

## 2014-06-25 DIAGNOSIS — F17200 Nicotine dependence, unspecified, uncomplicated: Secondary | ICD-10-CM

## 2014-06-25 DIAGNOSIS — I2589 Other forms of chronic ischemic heart disease: Secondary | ICD-10-CM

## 2014-06-25 DIAGNOSIS — I5023 Acute on chronic systolic (congestive) heart failure: Principal | ICD-10-CM

## 2014-06-25 LAB — GLUCOSE, CAPILLARY
GLUCOSE-CAPILLARY: 172 mg/dL — AB (ref 70–99)
GLUCOSE-CAPILLARY: 184 mg/dL — AB (ref 70–99)
GLUCOSE-CAPILLARY: 296 mg/dL — AB (ref 70–99)
Glucose-Capillary: 115 mg/dL — ABNORMAL HIGH (ref 70–99)
Glucose-Capillary: 208 mg/dL — ABNORMAL HIGH (ref 70–99)
Glucose-Capillary: 193 mg/dL — ABNORMAL HIGH (ref 70–99)
Glucose-Capillary: 88 mg/dL (ref 70–99)

## 2014-06-25 LAB — COMPREHENSIVE METABOLIC PANEL
ALT: 13 U/L (ref 0–53)
AST: 21 U/L (ref 0–37)
Albumin: 2.1 g/dL — ABNORMAL LOW (ref 3.5–5.2)
Alkaline Phosphatase: 82 U/L (ref 39–117)
Anion gap: 11 (ref 5–15)
BUN: 27 mg/dL — ABNORMAL HIGH (ref 6–23)
CALCIUM: 7.8 mg/dL — AB (ref 8.4–10.5)
CO2: 36 mEq/L — ABNORMAL HIGH (ref 19–32)
Chloride: 96 mEq/L (ref 96–112)
Creatinine, Ser: 1.61 mg/dL — ABNORMAL HIGH (ref 0.50–1.35)
GFR calc Af Amer: 48 mL/min — ABNORMAL LOW (ref >90)
GFR, EST NON AFRICAN AMERICAN: 41 mL/min — AB (ref >90)
Glucose, Bld: 61 mg/dL — ABNORMAL LOW (ref 70–99)
Potassium: 3 mEq/L — ABNORMAL LOW (ref 3.7–5.3)
SODIUM: 143 meq/L (ref 137–147)
Total Bilirubin: 0.3 mg/dL (ref 0.3–1.2)
Total Protein: 5.7 g/dL — ABNORMAL LOW (ref 6.0–8.3)

## 2014-06-25 LAB — PATHOLOGIST SMEAR REVIEW

## 2014-06-25 MED ORDER — SENNA 8.6 MG PO TABS
2.0000 | ORAL_TABLET | Freq: Every day | ORAL | Status: DC
Start: 2014-06-25 — End: 2014-06-27
  Administered 2014-06-25 – 2014-06-27 (×3): 17.2 mg via ORAL
  Filled 2014-06-25 (×3): qty 2

## 2014-06-25 MED ORDER — POTASSIUM CHLORIDE CRYS ER 20 MEQ PO TBCR
40.0000 meq | EXTENDED_RELEASE_TABLET | Freq: Four times a day (QID) | ORAL | Status: AC
  Administered 2014-06-25 (×2): 40 meq via ORAL
  Filled 2014-06-25 (×2): qty 2

## 2014-06-25 MED ORDER — FUROSEMIDE 10 MG/ML IJ SOLN
40.0000 mg | Freq: Two times a day (BID) | INTRAMUSCULAR | Status: DC
  Administered 2014-06-25 – 2014-06-26 (×2): 40 mg via INTRAVENOUS
  Filled 2014-06-25 (×4): qty 4

## 2014-06-25 MED ORDER — POLYETHYLENE GLYCOL 3350 17 G PO PACK
17.0000 g | PACK | Freq: Every day | ORAL | Status: DC
  Administered 2014-06-25 – 2014-06-27 (×3): 17 g via ORAL
  Filled 2014-06-25 (×3): qty 1

## 2014-06-25 MED ORDER — FUROSEMIDE 10 MG/ML IJ SOLN: 40.0000 mg | Freq: Once | INTRAMUSCULAR | Status: DC

## 2014-06-25 MED ORDER — NICOTINE 21 MG/24HR TD PT24
21.0000 mg | MEDICATED_PATCH | Freq: Every day | TRANSDERMAL | Status: DC
  Administered 2014-06-25 – 2014-06-27 (×3): 21 mg via TRANSDERMAL
  Filled 2014-06-25 (×3): qty 1

## 2014-06-25 MED ORDER — MILK AND MOLASSES ENEMA
1.0000 | Freq: Once | RECTAL | Status: DC
  Filled 2014-06-25: qty 250

## 2014-06-25 NOTE — Progress Notes (Signed)
Patient Name: Patrick [name]. Date of Encounter: 06/25/2014  Principal Problem:   Acute on chronic systolic CHF (congestive heart failure), NYHA class 3 Active Problems:   DM type 2 causing CKD stage 3   Dark stools   COPD (chronic obstructive pulmonary disease)   CHF (congestive heart failure)   Acute respiratory failure   Pleural effusion   Acute encephalopathy   Acute systolic CHF (congestive heart failure)   Urinary retention   Acute on chronic systolic CHF (congestive heart failure)   Length of Stay: 2  SUBJECTIVE  The patient states that he feels better today, his breathing has improved and abdomen feels less tight.  CURRENT MEDS . carvedilol  12.5 mg Oral BID WC  . furosemide  80 mg Intravenous BID  . insulin aspart  0-15 Units Subcutaneous 6 times per day  . OxyCODONE  15 mg Oral Q12H  . pantoprazole  40 mg Oral BID  . sodium chloride  3 mL Intravenous Q12H  . tiotropium  18 mcg Inhalation Daily    OBJECTIVE  Filed Vitals:   06/25/14 0000 06/25/14 0200 06/25/14 0400 06/25/14 0635  BP: 121/40 106/43 106/37 104/43  Pulse: 74 75 74 68  Temp: 99.1 F (37.3 C)  97.8 F (36.6 C)   TempSrc: Oral  Oral   Resp: Height:      Weight:   168 lb 10.4 oz (76.5 kg)   SpO2: 96% 95% 98% 100%    Intake/Output Summary (Last 24 hours) at 06/25/14 0803 Last data filed at 06/25/14 0528  Gross per 24 hour  Intake    480 ml  Output   1325 ml  Net   -845 ml   Filed Weights   06/24/14 0500 06/24/14 1824 06/25/14 0400  Weight: 175 lb 0.7 oz (79.4 kg) 168 lb 6.9 oz (76.4 kg) 168 lb 10.4 oz (76.5 kg)    PHYSICAL EXAM  General: Pleasant, NAD. Neuro: Alert and oriented X 3. Moves all extremities spontaneously. Psych: Normal affect. HEENT:  Normal  Neck: Supple without bruits or JVD. Lungs:  Resp regular and unlabored, decreased breathing sounds at the basis L>R, no wheezeing. Heart: RRR no s3, s4, or murmurs. Abdomen: Soft, non-tender,  Mildly  distended, BS + x 4.  Extremities: No clubbing, cyanosis, + 1 edema B/L. DP/PT/Radials 2+ and equal bilaterally.  Accessory Clinical Findings  CBC  Recent Labs  06/23/14 1304 06/24/14 1118  WBC 5.0 8.4  NEUTROABS 3.7  --   HGB 11.5* 13.0  HCT 38.2* 44.0  MCV 84.7 85.6  PLT 276 289   Basic Metabolic Panel  Recent Labs  06/23/14 1304 06/24/14 0302  NA 142 142  K 3.8 3.8  CL 98 96  CO2 35* 35*  GLUCOSE 158* 131*  BUN 20 24*  CREATININE 1.24 1.62*  CALCIUM 8.6 8.6   Liver Function Tests  Recent Labs  06/23/14 1304  AST 36  ALT 14  ALKPHOS 89  BILITOT 0.5  PROT 6.7  ALBUMIN 2.5*    Recent Labs  06/23/14 1304  LIPASE 10*   Cardiac Enzymes  Recent Labs  06/23/14 2152 06/24/14 0302 06/24/14 0836  TROPONINI <0.30 <0.30 <0.30   Hemoglobin A1C  Recent Labs  06/23/14 2152  HGBA1C 8.6*   Thyroid Function Tests  Recent Labs  06/23/14 2152  TSH 4.290    Radiology/Studies  Ct Abdomen Pelvis Wo Contrast  06/23/2014   CLINICAL DATA:  Two days of  abdominal pain and generalized weakness. Patient reports melena   IMPRESSION: 1. There is no evidence of bowel obstruction, enteritis, or acute colitis. No mural mass is demonstrated. There is sigmoid diverticulosis without definite evidence of acute diverticulitis. Certainly low-grade diverticulitis could be present without CT findings. There may be a fecal impaction given the moderately increased volume of stool in the rectum. 2. There is no acute hepatobiliary nor acute urinary tract abnormality. There are renal cysts on the right. 3. There is a large right pleural effusion with smaller left pleural effusion of uncertain etiology. There is diffusely increased density in the subcutaneous fat consistent with anasarca. There is enlargement of the cardiac chambers.   Dg Chest Port 1 View  06/24/2014   CLINICAL DATA:  Large RIGHT pleural effusion post thoracentesis   IMPRESSION: Markedly decreased RIGHT pleural  effusion and atelectasis post thoracentesis without pneumothorax.  Improved pulmonary edema.     Dg Chest Port 1 View  06/23/2014   IMPRESSION: Cardiomegaly.  Consolidative opacity within the right lower hemi thorax may represent elevation of the hemidiaphragm or potentially sub pulmonic effusion.  Perihilar interstitial opacities suggestive of edema in the acute setting.     US Thoracentesis Asp Pleural Space W/img Guide  06/24/2014   CLINICAL DATA:  RIGHT pleural effusion  EXAM: [place] RIGHT THORACENTESIS ASP PLEURAL SPACE W/IMG GUIDE  TECHNIQUE: Procedure, benefits, and risks of procedure were discussed with patient.  Written informed consent for procedure was obtained.  Time out protocol followed.  Pleural effusion localized by ultrasound at the posterior RIGHT hemithorax.  Skin prepped and draped in usual sterile fashion.  Skin and soft tissues anesthetized with 8 mL of 1% lidocaine.  8 French thoracentesis catheter placed into the RIGHT pleural space.  1700 mL of clear yellow fluid aspirated by syringe pump.  Procedure tolerated well by patient without immediate complication.  Fluid sent to laboratory for requested analysis.  COMPARISON:  Chest radiograph 06/23/2014, CT abdomen and pelvis 06/23/2014  FINDINGS: As above  IMPRESSION: Ultrasound guided RIGHT thoracentesis as above.   Electronically Signed   By: Ulyses Southward M.D.   On: 06/24/2014 12:54    TELE: Infrequent PVCs.    ASSESSMENT AND PLAN  1.Acute on Chronic Systolic CHF: EF of 25% per echo in 2013: Patient with anasarca, with paraphimosis, and urinary retention, s/p Foley placement the last night at 8 pm, diuresed 845 cc since then. Crea worsening 1.2 --1.6 yesterday, most probably sec to obstruction. Pending results today. I would recommend to give half dose of Lasix (40 mg iv) this am and give the rest of the dose in case Crea has improved, otherwise hold. Mildly improved fluid overload on physical exam.   If no good further diuresis  and worsening Crea, we should transfer the patient to [facility] for CHF eval and starting Milrinone.  The patient continues to have significant shortness of breath on BiPAP.  Tachycardia has resolved with coreg.  Discussed with Dr. Irene Limbo states that he will be also going to OR for a circumcision to allow for catheter insertion in the setting of paraphimosis. Recommend continue beta blocker therapy perioperatively.   2. Bilateral pleural effusions: Thoracentesis yesterday with 1700 mL of clear yellow fluid aspirated by syringe pump.  Markedly decreased RIGHT pleural effusion and atelectasis post thoracentesis without pneumothorax.  Improved pulmonary edema.     3. COPD: Complicating breathing status in the setting of heart failure. He is currently off BiPAP and seems comfortable.   4. Urinary Retention:  Foley inserted, circumcision for paraphimosis considered.    Signed, Lars Masson MD, [facility] 06/25/2014

## 2014-06-25 NOTE — Consult Note (Signed)
WOC wound consult note Reason for Consult: Nonhealing ulcer to left BKA stump on right lateral aspect.  Seen at [facility] wound care center in [place], [place].  Current treatment is Hydrofera Blue weekly.  Not available in house.  Will switch to alternate antimicrobial absorptive dressing.  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  Pressure Ulcer POA: Yes Measurement: 1 cm x 1 cm x 0.2 cm Wound bed: Pale pink, moist.  Drainage (amount, consistency, odor) Moderate, serosanguinous drainage.  No odor.  Periwound: intact Dressing procedure/placement/frequency: Cleanse ulcer to left BKA wound (right lateral aspect) with NS and pat gently dry.  Apply moistened silver hydrofiber dressing to wound bed.  Top with 3x3 Allevyn silicone border dressing.  Change twice weekly.  Will not follow at this time.  Please re-consult if needed.  Maple Hudson RN BSN CWON Pager [phone]

## 2014-06-25 NOTE — Progress Notes (Signed)
Pharmacist Heart Failure Core Measure Documentation  Assessment: Patrick [name]. has an EF documented as 15% - 20% on 06/24/14 by 2D ECHO.  Rationale: Heart failure patients with left ventricular systolic dysfunction (LVSD) and an EF < 40% should be prescribed an angiotensin converting enzyme inhibitor (ACEI) or angiotensin receptor blocker (ARB) at discharge unless a contraindication is documented in the medical record.  This patient is not currently on an ACEI or ARB for HF.  This note is being placed in the record in order to provide documentation that a contraindication to the use of these agents is present for this encounter.  ACE Inhibitor or Angiotensin Receptor Blocker is contraindicated (specify all that apply)    ACEI allergy AND ARB allergy   Angioedema   Moderate or severe aortic stenosis   Hyperkalemia   Hypotension   Renal artery stenosis   Worsening renal function, preexisting renal disease or dysfunction   [name] 06/25/2014 12:59 PM

## 2014-06-25 NOTE — Progress Notes (Addendum)
[facility] PROGRESS NOTE   Patrick [name]. WUJ:[id number] DOB: Jun 03, 1941 DOA: 06/23/2014 PCP: [name], MD  HPI/Subjective: Feeling better with less shortness of breath.  Assessment/Plan: Principal Problem:   Acute on chronic systolic CHF (congestive heart failure), NYHA class 3 Active Problems:   DM type 2 causing CKD stage 3   Dark stools   COPD (chronic obstructive pulmonary disease)   CHF (congestive heart failure)   Acute respiratory failure   Pleural effusion   Acute encephalopathy   Acute systolic CHF (congestive heart failure)   Urinary retention   Acute on chronic systolic CHF (congestive heart failure)   Acute mixed respiratory failure -Patient presented with acute mixed respiratory failure, respiratory acidosis requiring mechanical ventilation with BiPAP. -This is likely secondary to acute on chronic CHF and worsening large right pleural effusion. -Overall improving, less oxygen requirement, will wean O2 to keep sats above 90%.  Acute on chronic systolic CHF, LVEF is 15-20%  -Patient presented with anasarca, pulmonary edema/SOB and a lower extremity edema. -ProBNP of 37,000, and lower extremity edema. -Cardiology on board, continue diuresis, restricted fluid intake and sodium. Daily weight. -On Coreg 12.5 mg BID, Lasix 80 IV BID, decrease to 40. When stable kidney function consider low dose of ACEI.  Acute urinary retention -Secondary to phimosis, patient transferred from [facility] to [employer] to see urologist. -Foley catheter placed by urology on 9/21.  CKD stage III  -Baseline creatinine of 1.24, creatinine increased 1.6 after diuresis. -Continue to follow creatinine closely, check BMP in a.m.  Large right pleural effusion -Likely secondary to CHF and fluid overload, status post thoracentesis done by IR. -1700 mL of straw fluids removed, fluids with total protein of 2.4, LDH not checked.   Diabetes mellitus with renal  complication -Carbohydrate modified diet, A1c is 8.6 indicates uncontrolled diabetes. -Insulin sliding scale, patient was on 10 units of Lantus twice a day, this is on hold, fasting blood sugar is 115.  Black stool/constipation -Hemoglobin stable, patient is on iron at home, constipation is secondary to chronic OxyContin at home -CT scan of abdomen and pelvis showed moderate stool burden in the colon, questionable fecal impaction. -Reported 3 bowel movements since yesterday. Started on bowel regimen. -If continued to have black stools might need a GI evaluation.  Chronic pain/narcotic dependence -Has chronic back pain on 15 mg of OxyContin twice a day, that is continued.  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  History of CAD -Negative cardiac enzymes, continue aspirin and beta blockers.   Code Status: Full code Family Communication: Plan discussed with the patient. Disposition Plan: Remains inpatient, transfer to telemetry.   Consultants:  None  Procedures:  None  Antibiotics:  None   Objective: Filed Vitals:   06/25/14 0800  BP: 127/52  Pulse: 72  Temp:   Resp: 9    Intake/Output Summary (Last 24 hours) at 06/25/14 1046 Last data filed at 06/25/14 0528  Gross per 24 hour  Intake    480 ml  Output   1325 ml  Net   -845 ml   Filed Weights   06/24/14 0500 06/24/14 1824 06/25/14 0400  Weight: 79.4 kg (175 lb 0.7 oz) 76.4 kg (168 lb 6.9 oz) 76.5 kg (168 lb 10.4 oz)    Exam: General: Alert and awake, oriented x3, not in any acute distress. HEENT: anicteric sclera, pupils reactive to light and accommodation, EOMI CVS: S1-S2 clear, no murmur rubs or gallops Chest: clear to auscultation bilaterally,  no wheezing, rales or rhonchi Abdomen: soft nontender, nondistended, normal bowel sounds, no organomegaly Extremities: no cyanosis, clubbing or edema noted bilaterally Neuro: Cranial nerves II-XII intact, no focal neurological  deficits  Data Reviewed: Basic Metabolic Panel:  Recent Labs Lab 06/23/14 1304 06/24/14 0302 06/25/14 0808  NA 142 142 143  K 3.8 3.8 3.0*  CL 98 96 96  CO2 35* 35* 36*  GLUCOSE 158* 131* 61*  BUN 20 24* 27*  CREATININE 1.24 1.62* 1.61*  CALCIUM 8.6 8.6 7.8*   Liver Function Tests:  Recent Labs Lab 06/23/14 1304 06/25/14 0808  AST 36 21  ALT 14 13  ALKPHOS 89 82  BILITOT 0.5 0.3  PROT 6.7 5.7*  ALBUMIN 2.5* 2.1*    Recent Labs Lab 06/23/14 1304  LIPASE 10*   No results found for this basename: AMMONIA,  in the last 168 hours CBC:  Recent Labs Lab 06/23/14 1304 06/24/14 1118  WBC 5.0 8.4  NEUTROABS 3.7  --   HGB 11.5* 13.0  HCT 38.2* 44.0  MCV 84.7 85.6  PLT 276 289   Cardiac Enzymes:  Recent Labs Lab 06/23/14 1304 06/23/14 2152 06/24/14 0302 06/24/14 0836  TROPONINI <0.30 <0.30 <0.30 <0.30   BNP (last 3 results)  Recent Labs  10/25/13 1529 06/23/14 2152  PROBNP 45409* 37369.0*   CBG:  Recent Labs Lab 06/24/14 1625 06/24/14 1910 06/25/14 0007 06/25/14 0408 06/25/14 0750  GLUCAP 103* 106* 172* 184* 115*    Micro Recent Results (from the past 240 hour(s))  MRSA PCR SCREENING     Status: None   Collection Time    06/23/14  3:10 PM      Result Value Ref Range Status   MRSA by PCR NEGATIVE  NEGATIVE Final   Comment:            The GeneXpert MRSA Assay (FDA     approved for NASAL specimens     only), is one component of a     comprehensive MRSA colonization     surveillance program. It is not     intended to diagnose MRSA     infection nor to guide or     monitor treatment for     MRSA infections.  BODY FLUID CULTURE     Status: None   Collection Time    06/24/14 12:15 PM      Result Value Ref Range Status   Specimen Description FLUID RIGHT PLEURAL   Final   Special Requests NONE   Final   Gram Stain     Final   Value: NO WBC SEEN     NO ORGANISMS SEEN     Performed at [facility]   Culture PENDING    Incomplete   Report Status PENDING   Incomplete     Studies: Ct Abdomen Pelvis Wo Contrast  06/23/2014   CLINICAL DATA:  Two days of abdominal pain and generalized weakness. Patient reports melena  EXAM: CT ABDOMEN AND PELVIS WITHOUT CONTRAST  TECHNIQUE: Multidetector CT imaging of the abdomen and pelvis was performed following the standard protocol without IV contrast. The patient ingested only a small amount of oral contrast.  COMPARISON:  Portable chest x-ray of today's date  FINDINGS: There is a large right pleural effusion with small left pleural effusion. There is mild atelectasis of the right lower lobe adjacent to the effusion. The cardiac chambers are mildly enlarged.  The stomach is partially distended with nonopacified fluid. There is no evidence of  a small bowel obstruction. Contrast has reached the colon. The stool and gas pattern is unremarkable. There is sigmoid diverticulosis. A moderate amount of stool in the rectal vault is demonstrated. There is no free extraluminal fluid or gas. The perisigmoid soft tissues exhibit no significant significant inflammatory changes.  The gallbladder is surgically absent. The liver, spleen, pancreas, adrenal glands, and kidneys exhibit no acute abnormalities. There at rounded exophytic hypodensities associated with the mid and upper poles of the right kidney which exhibit Hounsfield value is of between 4 in 17. There are no calcified urinary tract stones. The abdominal aorta exhibits dense mural calcification but no aneurysm. There is no periaortic or pericaval lymphadenopathy.  The urinary bladder, prostate gland, and seminal vesicles exhibit no acute abnormalities.  There is diffusely increased density in the subcutaneous fat consistent with anasarca. There is degenerative disc space narrowing at L4-5. There is no compression fracture. The bony pelvis exhibits no acute abnormalities.  IMPRESSION: 1. There is no evidence of bowel obstruction, enteritis, or  acute colitis. No mural mass is demonstrated. There is sigmoid diverticulosis without definite evidence of acute diverticulitis. Certainly low-grade diverticulitis could be present without CT findings. There may be a fecal impaction given the moderately increased volume of stool in the rectum. 2. There is no acute hepatobiliary nor acute urinary tract abnormality. There are renal cysts on the right. 3. There is a large right pleural effusion with smaller left pleural effusion of uncertain etiology. There is diffusely increased density in the subcutaneous fat consistent with anasarca. There is enlargement of the cardiac chambers.   Electronically Signed   By: David  [place]   On: 06/23/2014 16:14   Dg Chest Port 1 View  06/24/2014   CLINICAL DATA:  Large RIGHT pleural effusion post thoracentesis  EXAM: PORTABLE CHEST - 1 VIEW  COMPARISON:  Portable exam 1229 hr compared to 06/23/2014  FINDINGS: Enlargement of cardiac silhouette with pulmonary vascular congestion.  Postoperative changes of median sternotomy.  Atherosclerotic calcification aorta.  Markedly decreased RIGHT pleural effusion post thoracentesis and removal of 1700 mL of fluid from the RIGHT chest.  No pneumothorax.  Decreased RIGHT basilar atelectasis.  Improved pulmonary edema in LEFT upper lobe.  Bones demineralized.  IMPRESSION: Markedly decreased RIGHT pleural effusion and atelectasis post thoracentesis without pneumothorax.  Improved pulmonary edema.   Electronically Signed   By: Ulyses Southward M.D.   On: 06/24/2014 13:03   Dg Chest Port 1 View  06/23/2014   CLINICAL DATA:  Shortness of breath.  Weakness.  EXAM: PORTABLE CHEST - 1 VIEW  COMPARISON:  None.  FINDINGS: Cardiomegaly. Status post median sternotomy. Elevation of the right hemidiaphragm. Consolidative opacity within the right lower hemi thorax. Extensive perihilar interstitial pulmonary opacities. No definite pneumothorax.  IMPRESSION: Cardiomegaly.  Consolidative opacity within the right  lower hemi thorax may represent elevation of the hemidiaphragm or potentially sub pulmonic effusion.  Perihilar interstitial opacities suggestive of edema in the acute setting.   Electronically Signed   By: Annia Belt M.D.   On: 06/23/2014 13:28   US Thoracentesis Asp Pleural Space W/img Guide  06/24/2014   CLINICAL DATA:  RIGHT pleural effusion  EXAM: [place] RIGHT THORACENTESIS ASP PLEURAL SPACE W/IMG GUIDE  TECHNIQUE: Procedure, benefits, and risks of procedure were discussed with patient.  Written informed consent for procedure was obtained.  Time out protocol followed.  Pleural effusion localized by ultrasound at the posterior RIGHT hemithorax.  Skin prepped and draped in usual sterile fashion.  Skin and  soft tissues anesthetized with 8 mL of 1% lidocaine.  8 French thoracentesis catheter placed into the RIGHT pleural space.  1700 mL of clear yellow fluid aspirated by syringe pump.  Procedure tolerated well by patient without immediate complication.  Fluid sent to laboratory for requested analysis.  COMPARISON:  Chest radiograph 06/23/2014, CT abdomen and pelvis 06/23/2014  FINDINGS: As above  IMPRESSION: Ultrasound guided RIGHT thoracentesis as above.   Electronically Signed   By: Ulyses Southward M.D.   On: 06/24/2014 12:54    Scheduled Meds: . carvedilol  12.5 mg Oral BID WC  . furosemide  80 mg Intravenous BID  . insulin aspart  0-15 Units Subcutaneous 6 times per day  . OxyCODONE  15 mg Oral Q12H  . pantoprazole  40 mg Oral BID  . sodium chloride  3 mL Intravenous Q12H  . tiotropium  18 mcg Inhalation Daily   Continuous Infusions:      Time spent: 35 minutes    [facility]  [facility] Pager [phone] If 7PM-7AM, please contact night-coverage at [email or web address], password [facility] 06/25/2014, 10:46 AM  LOS: 2 days

## 2014-06-26 DIAGNOSIS — R339 Retention of urine, unspecified: Secondary | ICD-10-CM

## 2014-06-26 LAB — RENAL FUNCTION PANEL
ANION GAP: 9 (ref 5–15)
Albumin: 2.2 g/dL — ABNORMAL LOW (ref 3.5–5.2)
BUN: 27 mg/dL — ABNORMAL HIGH (ref 6–23)
CALCIUM: 7.8 mg/dL — AB (ref 8.4–10.5)
CHLORIDE: 98 meq/L (ref 96–112)
CO2: 35 meq/L — AB (ref 19–32)
Creatinine, Ser: 1.55 mg/dL — ABNORMAL HIGH (ref 0.50–1.35)
GFR calc non Af Amer: 43 mL/min — ABNORMAL LOW (ref >90)
GFR, EST AFRICAN AMERICAN: 50 mL/min — AB (ref >90)
Glucose, Bld: 148 mg/dL — ABNORMAL HIGH (ref 70–99)
Phosphorus: 3.1 mg/dL (ref 2.3–4.6)
Potassium: 3.3 mEq/L — ABNORMAL LOW (ref 3.7–5.3)
SODIUM: 142 meq/L (ref 137–147)

## 2014-06-26 LAB — GLUCOSE, CAPILLARY
GLUCOSE-CAPILLARY: 329 mg/dL — AB (ref 70–99)
Glucose-Capillary: 127 mg/dL — ABNORMAL HIGH (ref 70–99)
Glucose-Capillary: 195 mg/dL — ABNORMAL HIGH (ref 70–99)
Glucose-Capillary: 165 mg/dL — ABNORMAL HIGH (ref 70–99)
Glucose-Capillary: 325 mg/dL — ABNORMAL HIGH (ref 70–99)

## 2014-06-26 LAB — CBC
HCT: 36.6 % — ABNORMAL LOW (ref 39.0–52.0)
Hemoglobin: 11 g/dL — ABNORMAL LOW (ref 13.0–17.0)
MCH: 25.3 pg — AB (ref 26.0–34.0)
MCHC: 30.1 g/dL (ref 30.0–36.0)
MCV: 84.1 fL (ref 78.0–100.0)
PLATELETS: 253 10*3/uL (ref 150–400)
RBC: 4.35 MIL/uL (ref 4.22–5.81)
RDW: 16.1 % — ABNORMAL HIGH (ref 11.5–15.5)
WBC: 7.7 10*3/uL (ref 4.0–10.5)

## 2014-06-26 MED ORDER — FUROSEMIDE 40 MG PO TABS
40.0000 mg | ORAL_TABLET | Freq: Two times a day (BID) | ORAL | Status: DC
  Administered 2014-06-26 – 2014-06-27 (×2): 40 mg via ORAL
  Filled 2014-06-26 (×5): qty 1

## 2014-06-26 MED ORDER — ASPIRIN 81 MG PO TABS
81.0000 mg | ORAL_TABLET | Freq: Every day | ORAL | Status: DC
  Filled 2014-06-26: qty 1

## 2014-06-26 MED ORDER — ASPIRIN 81 MG PO CHEW
81.0000 mg | CHEWABLE_TABLET | Freq: Every day | ORAL | Status: DC
  Administered 2014-06-26 – 2014-06-27 (×2): 81 mg via ORAL
  Filled 2014-06-26 (×2): qty 1

## 2014-06-26 MED ORDER — POTASSIUM CHLORIDE CRYS ER 20 MEQ PO TBCR
30.0000 meq | EXTENDED_RELEASE_TABLET | Freq: Two times a day (BID) | ORAL | Status: AC
  Administered 2014-06-26 (×2): 30 meq via ORAL
  Filled 2014-06-26 (×2): qty 1

## 2014-06-26 MED ORDER — POTASSIUM CHLORIDE CRYS ER 20 MEQ PO TBCR: 40.0000 meq | EXTENDED_RELEASE_TABLET | Freq: Once | ORAL | Status: DC

## 2014-06-26 MED ORDER — OXYCODONE HCL ER 20 MG PO T12A
60.0000 mg | EXTENDED_RELEASE_TABLET | Freq: Two times a day (BID) | ORAL | Status: DC
  Administered 2014-06-26: 60 mg via ORAL
  Filled 2014-06-26: qty 3

## 2014-06-26 MED ORDER — ZOLPIDEM TARTRATE 5 MG PO TABS
5.0000 mg | ORAL_TABLET | Freq: Every evening | ORAL | Status: DC | PRN
  Administered 2014-06-26: 5 mg via ORAL
  Filled 2014-06-26: qty 1

## 2014-06-26 NOTE — Evaluation (Signed)
Physical Therapy Evaluation Patient Details Name: Patrick [name]. 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  While in the emergency department he developed acute respiratory failure and lethargy and was admitted for acute respiratory acidosis, hypercapnia, large right pleural effusion and volume overload/anasarca. Patient further had urinary retention with phimosis with difficult catheterization and required transfer to [facility] for urology evaluation.  Clinical Impression  Pt currently with functional limitations due to the deficits listed below (see PT Problem List).  Pt will benefit from skilled PT to increase their independence and safety with mobility to allow discharge to the venue listed below.   Pt fatigued very quickly during ambulation (did not use L prosthesis due to pressure ulcer) and required increased assist with transfer back to recliner.  Pt reports assist at home however uncertain if they will be able to provide required assist level at this time.  Pt would benefit from ST-SNF prior to home.  Recommend w/c level mobility if pt does d/c home for safety.   Follow Up Recommendations SNF;Supervision/Assistance - 24 hour    Equipment Recommendations  None recommended by PT    Recommendations for Other Services       Precautions / Restrictions Precautions Precautions: Fall Precaution Comments: L BKA with pressure ulcer from prosthesis      Mobility  Bed Mobility               General bed mobility comments: pt up in recliner on arrival  Transfers Overall transfer level: Needs assistance Equipment used:  Rolling walker (2 wheeled) Transfers: Sit to/from [employer] Sit to Stand: Min assist Stand pivot transfers: +2 physical assistance;Max assist       General transfer comment: pt with good technique to standing from recliner, however due to fatigue from ambulation increased assist required for pivot back to recliner  Ambulation/Gait Ambulation/Gait assistance: Min assist Ambulation Distance (Feet): 10 Feet Assistive device: Rolling walker (2 wheeled)       General Gait Details: assist for weakness and steadying, pt fatigued very quickly requiring chair brought behind pt to sit and rolled back into room  Stairs            Wheelchair Mobility    Modified Rankin (Stroke Patients Only)       Balance                                             Pertinent Vitals/Pain Pain Assessment: No/denies pain    Home Living Family/patient expects to be discharged to:: Private residence Living Arrangements: Children Available Help at Discharge: Family;Personal care attendant;Available 24 hours/day   Home Access: Ramped entrance     Home Layout: One level Home Equipment: Walker - 2 wheels;Wheelchair - manual      Prior Function Level of Independence: Independent with assistive device(s)               Hand Dominance        Extremity/Trunk Assessment               Lower Extremity Assessment: Generalized  weakness;RLE deficits/detail;LLE deficits/detail RLE Deficits / Details: grossly at least 3/5 to perform ambulation however fatigues quickly LLE Deficits / Details: L BKA     Communication   Communication: HOH  Cognition Arousal/Alertness: Awake/alert Behavior During Therapy: Flat affect Overall Cognitive Status: Within Functional Limits for tasks assessed                      General Comments      Exercises        Assessment/Plan    PT Assessment Patient needs continued PT services  PT Diagnosis  Generalized weakness;Difficulty walking   PT Problem List Decreased strength;Decreased activity tolerance;Decreased mobility;Decreased balance  PT Treatment Interventions DME instruction;Gait training;Functional mobility training;Patient/family education;Therapeutic activities;Therapeutic exercise;Wheelchair mobility training   PT Goals (Current goals can be found in the Care Plan section) Acute Rehab PT Goals PT Goal Formulation: With patient Time For Goal Achievement: 07/03/14 Potential to Achieve Goals: Good    Frequency Min 3X/week   Barriers to discharge        Co-evaluation               End of Session Equipment Utilized During Treatment: Gait belt Activity Tolerance: Patient limited by fatigue Patient left: in chair;with call bell/phone within reach           Time: 6962-9528 PT Time Calculation (min): 15 min   Charges:   PT Evaluation $Initial PT Evaluation Tier I: 1 Procedure PT Treatments $Gait Training: 8-22 mins   PT G Codes:          Caydyn Sprung,KATHrine E 06/26/2014, 3:19 PM Zenovia Jarred, PT, DPT 06/26/2014 Pager: [phone]

## 2014-06-26 NOTE — Progress Notes (Addendum)
[facility] PROGRESS NOTE  Patrick [name]. UXL:[id number] DOB: [birth year] DOA: 06/23/2014 PCP: [name], MD   Brief narrative 73 year old man with  medical history  chronic systolic congestive heart failure, LVEF 20-25%, generalized weakness, fall, opiate dependence for pain, left BKA, presented to [facility] ED for abdominal pain and dark stools. Workup of abdominal pain was unremarkable except for heme-positive stool but while in the emergency department he developed acute respiratory failure and lethargy and  was admitted for acute respiratory acidosis, hypercapnia, large right pleural effusion and volume overload/anasarca. Patient further had urinary retention with phimosis with difficult catheterization requiring trasnfer to [facility] for urology evaluation at [facility] for foley placement.  Assessment/Plan: Acute hypoxic and hypercarbic resp failure  secondary to acute exacerbation of severe cardiomyopathy and development fo b/l pleural effusion.. Patient required BiPAP on admission. Now improved on [place] after IV diuresis and thoracentesis. . -2d echo with markedly [place]send EF of 15-20 %. IV lasix switched to po today. Net negative 2.1 L. Symptoms much improved after right  thoracentesis with 1.7L clear fluid removed on 9/21. -lasix switched to po. -monitor strict I/O and daily weights. Fluid restriction. -as per cardiology, may d/c home tomorrow if improving. Will d/w about need for AICD/ life vest prior to discharge. -continue coreg and  aspirin. Add ACEi once renal fn improves, otehwise will d/c on bidil. . Check lipid panel in am   Acute urinary retention  -Secondary to phimosis, patient transferred from [facility] to [place] to see urologist.  -Foley catheter placed by urology on 9/21, no further intervention recommended. Will d/w urology re: follow up and  need for circumcision.  AKI  On CKD stage III  -Baseline creatinine of 1.24, creatinine increased 1.6 after diuresis possibly  due to obstructive uropathy.  -Continue to follow creatinine closely, check BMP in a.m.   Acute encephalopathy  possibly secondary to home narcotics for chr pain. Pt on high dose narcotics which were held an restarted at lower dose. Now at baseline.   Large right pleural effusion  -Likely secondary to CHF and fluid overload, status post thoracentesis done by IR with 1700 mL of transudative  fluids removed  Diabetes mellitus with nephropathy - A1c is 8.6 -Insulin sliding scale, patient was on 10 units of Lantus twice a day which has been on hold  Black stool/constipation  -Hemoglobin stable, patient is on iron at home, constipation is secondary to chronic OxyContin at home  -CT scan of abdomen and pelvis showed moderate stool burden in the colon, questionable fecal impaction. Has good BM now. -h&h stable.   Hypokalemia  replenish  COPD Stable, continue inhaler.    Chronic pain/narcotic dependence  -Has chronic back pain on 15 mg of OxyContin twice a day, that is continued.   PVD status post left lower extremity and BKA  -Stable continue aspirin.   History of CAD  -Negative cardiac enzymes, add  Aspirin, continue  beta blockers.       Code Status: DNR Family Communication: Brother and  daughter at bedside Disposition Plan: home on 9/24 if improved   Consultants:  Cardiology   urology  Procedures:  Rt thoracetesis on 9/21    Antibiotics:  none  HPI/Subjective: Seen and examined. Reports breathing to be better. Asking for his home dose oxycontin for pain  Objective: Filed Vitals:   06/26/14 0453  BP: 111/59  Pulse: 70  Temp: 98.4 F (36.9 C)  Resp: 22    Intake/Output Summary (Last 24 hours) at  06/26/14 1217 Last data filed at 06/26/14 1000  Gross per 24 hour  Intake    120 ml  Output   1625 ml  Net  -1505 ml   Filed Weights   06/24/14 1824 06/25/14 0400 06/26/14 0639  Weight: 76.4 kg (168 lb 6.9 oz) 76.5 kg (168 lb 10.4 oz) 75.025 kg (165  lb 6.4 oz)    Exam:   General:  Elderly male in NA  D HEENT: no pallor, moist mucosa  Chest: bibasilar crackles, no rhonchi or wheeze  Abd: soft, NT, ND, BS+  Ext: warm, left BKA  CNS: alert and oreinted    Data Reviewed: Basic Metabolic Panel:  Recent Labs Lab 06/23/14 1304 06/24/14 0302 06/25/14 0808 06/26/14 0445  NA 142 142 143 142  K 3.8 3.8 3.0* 3.3*  CL 98 96 96 98  CO2 35* 35* 36* 35*  GLUCOSE 158* 131* 61* 148*  BUN 20 24* 27* 27*  CREATININE 1.24 1.62* 1.61* 1.55*  CALCIUM 8.6 8.6 7.8* 7.8*  PHOS  --   --   --  3.1   Liver Function Tests:  Recent Labs Lab 06/23/14 1304 06/25/14 0808 06/26/14 0445  AST 36 21  --   ALT 14 13  --   ALKPHOS 89 82  --   BILITOT 0.5 0.3  --   PROT 6.7 5.7*  --   ALBUMIN 2.5* 2.1* 2.2*    Recent Labs Lab 06/23/14 1304  LIPASE 10*   No results found for this basename: AMMONIA,  in the last 168 hours CBC:  Recent Labs Lab 06/23/14 1304 06/24/14 1118 06/26/14 0445  WBC 5.0 8.4 7.7  NEUTROABS 3.7  --   --   HGB 11.5* 13.0 11.0*  HCT 38.2* 44.0 36.6*  MCV 84.7 85.6 84.1  PLT 276 289 253   Cardiac Enzymes:  Recent Labs Lab 06/23/14 1304 06/23/14 2152 06/24/14 0302 06/24/14 0836  TROPONINI <0.30 <0.30 <0.30 <0.30   BNP (last 3 results)  Recent Labs  10/25/13 1529 06/23/14 2152  PROBNP 16109* 37369.0*   CBG:  Recent Labs Lab 06/25/14 1947 06/25/14 2316 06/26/14 0337 06/26/14 0722 06/26/14 1129  GLUCAP 193* 88 127* 195* 165*    Recent Results (from the past 240 hour(s))  MRSA PCR SCREENING     Status: None   Collection Time    06/23/14  3:10 PM      Result Value Ref Range Status   MRSA by PCR NEGATIVE  NEGATIVE Final   Comment:            The GeneXpert MRSA Assay (FDA     approved for NASAL specimens     only), is one component of a     comprehensive MRSA colonization     surveillance program. It is not     intended to diagnose MRSA     infection nor to guide or      monitor treatment for     MRSA infections.  BODY FLUID CULTURE     Status: None   Collection Time    06/24/14 12:15 PM      Result Value Ref Range Status   Specimen Description FLUID RIGHT PLEURAL   Final   Special Requests NONE   Final   Gram Stain     Final   Value: NO WBC SEEN     NO ORGANISMS SEEN     Performed at [facility]   Culture     Final  Value: NO GROWTH 2 DAYS     Performed at [facility]   Report Status PENDING   Incomplete     Studies: Dg Chest Port 1 View  06/24/2014   CLINICAL DATA:  Large RIGHT pleural effusion post thoracentesis  EXAM: PORTABLE CHEST - 1 VIEW  COMPARISON:  Portable exam 1229 hr compared to 06/23/2014  FINDINGS: Enlargement of cardiac silhouette with pulmonary vascular congestion.  Postoperative changes of median sternotomy.  Atherosclerotic calcification aorta.  Markedly decreased RIGHT pleural effusion post thoracentesis and removal of 1700 mL of fluid from the RIGHT chest.  No pneumothorax.  Decreased RIGHT basilar atelectasis.  Improved pulmonary edema in LEFT upper lobe.  Bones demineralized.  IMPRESSION: Markedly decreased RIGHT pleural effusion and atelectasis post thoracentesis without pneumothorax.  Improved pulmonary edema.   Electronically Signed   By: Ulyses Southward M.D.   On: 06/24/2014 13:03   US Thoracentesis Asp Pleural Space W/img Guide  06/24/2014   CLINICAL DATA:  RIGHT pleural effusion  EXAM: [place] RIGHT THORACENTESIS ASP PLEURAL SPACE W/IMG GUIDE  TECHNIQUE: Procedure, benefits, and risks of procedure were discussed with patient.  Written informed consent for procedure was obtained.  Time out protocol followed.  Pleural effusion localized by ultrasound at the posterior RIGHT hemithorax.  Skin prepped and draped in usual sterile fashion.  Skin and soft tissues anesthetized with 8 mL of 1% lidocaine.  8 French thoracentesis catheter placed into the RIGHT pleural space.  1700 mL of clear yellow fluid aspirated by syringe pump.   Procedure tolerated well by patient without immediate complication.  Fluid sent to laboratory for requested analysis.  COMPARISON:  Chest radiograph 06/23/2014, CT abdomen and pelvis 06/23/2014  FINDINGS: As above  IMPRESSION: Ultrasound guided RIGHT thoracentesis as above.   Electronically Signed   By: Ulyses Southward M.D.   On: 06/24/2014 12:54    Scheduled Meds: . carvedilol  12.5 mg Oral BID WC  . furosemide  40 mg Oral BID  . insulin aspart  0-15 Units Subcutaneous 6 times per day  . milk and molasses  1 enema Rectal Once  . nicotine  21 mg Transdermal Daily  . OxyCODONE  60 mg Oral Q12H  . pantoprazole  40 mg Oral BID  . polyethylene glycol  17 g Oral Daily  . potassium chloride  30 mEq Oral BID  . senna  2 tablet Oral Daily  . sodium chloride  3 mL Intravenous Q12H  . tiotropium  18 mcg Inhalation Daily   Continuous Infusions:      Time spent: 25 minutes    Timya Trimmer  [facility] Pager [phone] If 7PM-7AM, please contact night-coverage at [email or web address], password [facility] 06/26/2014, 12:17 PM  LOS: 3 days

## 2014-06-26 NOTE — Progress Notes (Addendum)
Clinical Social Work Department CLINICAL SOCIAL WORK PLACEMENT NOTE 06/26/2014  Patient:  Patrick [name], Patrick [name]  Account Number:  [id number] Admit date:  06/23/2014  Clinical Social Worker:  Unk Lightning, LCSW  Date/time:  06/26/2014 03:30 PM  Clinical Social Work is seeking post-discharge placement for this patient at the following level of care:   SKILLED NURSING   (*CSW will update this form in Epic as items are completed)   06/26/2014  Patient/family provided with [facility] Department of Clinical Social Work's list of facilities offering this level of care within the geographic area requested by the patient (or if unable, by the patient's family).  06/26/2014  Patient/family informed of their freedom to choose among providers that offer the needed level of care, that participate in Medicare, Medicaid or managed care program needed by the patient, have an available bed and are willing to accept the patient.  06/26/2014  Patient/family informed of [facility]' ownership interest in [facility], as well as of the fact that they are under no obligation to receive care at this facility.  PASARR submitted to EDS on existing # PASARR number received on   FL2 transmitted to all facilities in geographic area requested by pt/family on  06/26/2014 FL2 transmitted to all facilities within larger geographic area on   Patient informed that his/her managed care company has contracts with or will negotiate with  certain facilities, including the following:     Patient/family informed of bed offers received:  06/27/14 Patient chooses bed at [facility] [place] Physician recommends and patient chooses bed at    Patient to be transferred to [facility] [place]  on  06/27/14 Patient to be transferred to facility by [facility] Patient and family notified of transfer on 06/27/14 Name of family member notified:  Shawn-dtr  The following physician request were entered in  Epic:   Additional Comments:

## 2014-06-26 NOTE — Progress Notes (Addendum)
Patient Name: Patrick [name]. Date of Encounter: 06/26/2014  Principal Problem:   Acute on chronic systolic CHF (congestive heart failure), NYHA class 3 Active Problems:   DM type 2 causing CKD stage 3   Dark stools   COPD (chronic obstructive pulmonary disease)   CHF (congestive heart failure)   Acute respiratory failure   Pleural effusion   Acute encephalopathy   Acute systolic CHF (congestive heart failure)   Urinary retention   Acute on chronic systolic CHF (congestive heart failure)   Length of Stay: 3  SUBJECTIVE  The patient states that he feels better today, his breathing has improved and abdomen feels less tight. He wants to go home.  CURRENT MEDS . carvedilol  12.5 mg Oral BID WC  . furosemide  40 mg Intravenous BID  . furosemide  40 mg Intravenous Once  . insulin aspart  0-15 Units Subcutaneous 6 times per day  . milk and molasses  1 enema Rectal Once  . nicotine  21 mg Transdermal Daily  . OxyCODONE  15 mg Oral Q12H  . pantoprazole  40 mg Oral BID  . polyethylene glycol  17 g Oral Daily  . potassium chloride  30 mEq Oral BID  . senna  2 tablet Oral Daily  . sodium chloride  3 mL Intravenous Q12H  . tiotropium  18 mcg Inhalation Daily    OBJECTIVE  Filed Vitals:   06/25/14 2033 06/25/14 2147 06/26/14 0453 06/26/14 0639  BP: 119/49 100/52 111/59   Pulse: 79 75 70   Temp:  98.3 F (36.8 C) 98.4 F (36.9 C)   TempSrc:  Oral Oral   Resp: Height:      Weight:    165 lb 6.4 oz (75.025 kg)  SpO2: 90%  95%     Intake/Output Summary (Last 24 hours) at 06/26/14 0808 Last data filed at 06/26/14 0454  Gross per 24 hour  Intake    240 ml  Output   1625 ml  Net  -1385 ml   Filed Weights   06/24/14 1824 06/25/14 0400 06/26/14 0639  Weight: 168 lb 6.9 oz (76.4 kg) 168 lb 10.4 oz (76.5 kg) 165 lb 6.4 oz (75.025 kg)    PHYSICAL EXAM  General: Pleasant, NAD. Neuro: Alert and oriented X 3. Moves all extremities spontaneously. Psych:  Normal affect. HEENT:  Normal  Neck: Supple without bruits or JVD. Lungs:  Resp regular and unlabored, decreased breathing sounds at the basis L>R, no wheezeing. Heart: RRR no s3, s4, or murmurs. Abdomen: Soft, non-tender,  Mildly distended, BS + x 4.  Extremities: No clubbing, cyanosis, + 1 edema B/L. DP/PT/Radials 2+ and equal bilaterally.  Accessory Clinical Findings  CBC  Recent Labs  06/23/14 1304 06/24/14 1118 06/26/14 0445  WBC 5.0 8.4 7.7  NEUTROABS 3.7  --   --   HGB 11.5* 13.0 11.0*  HCT 38.2* 44.0 36.6*  MCV 84.7 85.6 84.1  PLT 276 289 253   Basic Metabolic Panel  Recent Labs  06/25/14 0808 06/26/14 0445  NA 143 142  K 3.0* 3.3*  CL 96 98  CO2 36* 35*  GLUCOSE 61* 148*  BUN 27* 27*  CREATININE 1.61* 1.55*  CALCIUM 7.8* 7.8*  PHOS  --  3.1   Liver Function Tests  Recent Labs  06/23/14 1304 06/25/14 0808 06/26/14 0445  AST 36 21  --   ALT 14 13  --   ALKPHOS 89 82  --  BILITOT 0.5 0.3  --   PROT 6.7 5.7*  --   ALBUMIN 2.5* 2.1* 2.2*    Recent Labs  06/23/14 1304  LIPASE 10*   Cardiac Enzymes  Recent Labs  06/23/14 2152 06/24/14 0302 06/24/14 0836  TROPONINI <0.30 <0.30 <0.30   Hemoglobin A1C  Recent Labs  06/23/14 2152  HGBA1C 8.6*   Thyroid Function Tests  Recent Labs  06/23/14 2152  TSH 4.290    Radiology/Studies  Ct Abdomen Pelvis Wo Contrast  06/23/2014   CLINICAL DATA:  Two days of abdominal pain and generalized weakness. Patient reports melena   IMPRESSION: 1. There is no evidence of bowel obstruction, enteritis, or acute colitis. No mural mass is demonstrated. There is sigmoid diverticulosis without definite evidence of acute diverticulitis. Certainly low-grade diverticulitis could be present without CT findings. There may be a fecal impaction given the moderately increased volume of stool in the rectum. 2. There is no acute hepatobiliary nor acute urinary tract abnormality. There are renal cysts on the right. 3.  There is a large right pleural effusion with smaller left pleural effusion of uncertain etiology. There is diffusely increased density in the subcutaneous fat consistent with anasarca. There is enlargement of the cardiac chambers.   Dg Chest Port 1 View  06/24/2014   CLINICAL DATA:  Large RIGHT pleural effusion post thoracentesis   IMPRESSION: Markedly decreased RIGHT pleural effusion and atelectasis post thoracentesis without pneumothorax.  Improved pulmonary edema.     Dg Chest Port 1 View  06/23/2014   IMPRESSION: Cardiomegaly.  Consolidative opacity within the right lower hemi thorax may represent elevation of the hemidiaphragm or potentially sub pulmonic effusion.  Perihilar interstitial opacities suggestive of edema in the acute setting.     US Thoracentesis Asp Pleural Space W/img Guide  06/24/2014   CLINICAL DATA:  RIGHT pleural effusion  EXAM: [place] RIGHT THORACENTESIS ASP PLEURAL SPACE W/IMG GUIDE  TECHNIQUE: Procedure, benefits, and risks of procedure were discussed with patient.  Written informed consent for procedure was obtained.  Time out protocol followed.  Pleural effusion localized by ultrasound at the posterior RIGHT hemithorax.  Skin prepped and draped in usual sterile fashion.  Skin and soft tissues anesthetized with 8 mL of 1% lidocaine.  8 French thoracentesis catheter placed into the RIGHT pleural space.  1700 mL of clear yellow fluid aspirated by syringe pump.  Procedure tolerated well by patient without immediate complication.  Fluid sent to laboratory for requested analysis.  COMPARISON:  Chest radiograph 06/23/2014, CT abdomen and pelvis 06/23/2014  FINDINGS: As above  IMPRESSION: Ultrasound guided RIGHT thoracentesis as above.   Electronically Signed   By: Ulyses Southward M.D.   On: 06/24/2014 12:54    TELE: Infrequent PVCs.    ASSESSMENT AND PLAN  1.Acute on Chronic Systolic CHF: EF of 25% per echo in 2013: Patient with anasarca, with paraphimosis, and urinary retention, s/p  Foley placement the last night at 8 pm, diuresed > 2.1 L since then (48 hours). Crea worsening 1.2 --1.6 yesterday, slight improvement today, most probably sec to obstruction.  I would switch iv Lasix to PO and see how he responds, anticipated discharge tomorrow.  Improved fluid overload on physical exam.   2. Bilateral pleural effusions: Thoracentesis yesterday with 1700 mL of clear yellow fluid aspirated by syringe pump.  Markedly decreased RIGHT pleural effusion and atelectasis post thoracentesis without pneumothorax.  Improved pulmonary edema.     3. COPD: Complicating breathing status in the setting of heart failure. He  is currently off BiPAP and seems comfortable.   4. Urinary Retention: Foley inserted, circumcision for paraphimosis considered.   5. Hypokalemia - replace  Signed, Lars Masson MD, [facility] 06/26/2014

## 2014-06-26 NOTE — Care Management Note (Signed)
Patient is active with [facility] for [facility]. Resumption of care orders requested upon discharge.

## 2014-06-26 NOTE — Progress Notes (Signed)
Clinical Social Work Department BRIEF PSYCHOSOCIAL ASSESSMENT 06/26/2014  Patient:  Patrick [name], Patrick [name]     Account Number:  [id number]     [place] date:  06/23/2014  Clinical Social Worker:  Earlie Server  Date/Time:  06/26/2014 03:30 PM  Referred by:  Physician  Date Referred:  06/26/2014 Referred for  SNF Placement   Other Referral:   Interview type:  Patient Other interview type:    PSYCHOSOCIAL DATA Living Status:  FRIEND(S) Admitted from facility:   Level of care:   Primary support name:  Shawn Primary support relationship to patient:  CHILD, ADULT Degree of support available:   Strong    CURRENT CONCERNS Current Concerns  Post-Acute Placement   Other Concerns:    SOCIAL WORK ASSESSMENT / PLAN CSW received referral in order to assist with DC planning. CSW reviewed chart and spoke with PT prior to meeting with patient. CSW met with patient at bedside. CSW introduced myself and explained role.    Patient reports that he lives at home with a friend. Patient reports that he has been weak and knows that PT is recommending SNF. Patient reports he has been to SNF in the past and wants to think about his options. Patient reports he would want placement in [facility]. CSW left list and explained Medicare coverage for SNF. Patient agreeable to review list and agreeable for CSW to fax him out. Patient agreeable for CSW to contact dtr as well to explain DC plans. Dtr reports that patient went to [facility] in the past and it was a good experience. Dtr has CSW contact information and is agreeable to contact CSW with further questions.    CSW completed FL2 and faxed out. CSW will follow up with bed offers.   Assessment/plan status:  Psychosocial Support/Ongoing Assessment of Needs Other assessment/ plan:   Information/referral to community resources:   SNF list    PATIENT'S/FAMILY'S RESPONSE TO PLAN OF CARE: Patient alert and oriented. Patient agreeable to search but wants to  consider all of his options. Patient has a friend that stays with him but does agree that he is weak. Dtr reports she would feel much more comfortable if patient goes to SNF and feels that patient is very deconditioned. Dtr reports she will talk with patient and encourage SNF placement and assist with choosing facility. Dtr thanked CSW for call and will call CSW if further questions arise.       [place], [place] [phone]

## 2014-06-26 NOTE — Care Management Note (Addendum)
    Page 1 of 1   06/27/2014     10:51:28 AM CARE MANAGEMENT NOTE 06/27/2014  Patient:  JEDRICK, HUTCHERSON A   Account Number:  [id number]  Date Initiated:  06/26/2014  Documentation initiated by:  Lanier Clam  Subjective/Objective Assessment:   73 Y/O M ADMITTED W/RESP FAILURE.HX:L BKA.     Action/Plan:   FROM HOME.   Anticipated DC Date:  06/27/2014   Anticipated DC Plan:  SKILLED NURSING FACILITY      DC Planning Services  CM consult      Choice offered to / List presented to:             Status of service:  In process, will continue to follow Medicare Important Message given?  YES (If response is "NO", the following Medicare IM given date fields will be blank) Date Medicare IM given:  06/26/2014 Medicare IM given by:  [facility] Date Additional Medicare IM given:   Additional Medicare IM given by:    Discharge Disposition:  SKILLED NURSING FACILITY  Per UR Regulation:  Reviewed for med. necessity/level of care/duration of stay  If discussed at Long Length of Stay Meetings, dates discussed:    Comments:  06/27/14 Manasseh Pittsley RN,BSN NCM [phone] D/C SNF.  06/26/14 Baer Hinton RN,BSN NCM [phone] CARDIO-CHF, PLEURAL EFFUSION.WOC FOLLOWING-NON HEALING ULCER L BKA.

## 2014-06-27 ENCOUNTER — Telehealth: Payer: Self-pay

## 2014-06-27 DIAGNOSIS — E876 Hypokalemia: Secondary | ICD-10-CM | POA: Diagnosis present

## 2014-06-27 DIAGNOSIS — N471 Phimosis: Secondary | ICD-10-CM | POA: Diagnosis present

## 2014-06-27 LAB — BODY FLUID CULTURE
CULTURE: NO GROWTH
Gram Stain: NONE SEEN

## 2014-06-27 LAB — BASIC METABOLIC PANEL
ANION GAP: 10 (ref 5–15)
BUN: 30 mg/dL — ABNORMAL HIGH (ref 6–23)
CALCIUM: 7.9 mg/dL — AB (ref 8.4–10.5)
CHLORIDE: 98 meq/L (ref 96–112)
CO2: 35 mEq/L — ABNORMAL HIGH (ref 19–32)
CREATININE: 1.4 mg/dL — AB (ref 0.50–1.35)
GFR calc Af Amer: 57 mL/min — ABNORMAL LOW (ref >90)
GFR calc non Af Amer: 49 mL/min — ABNORMAL LOW (ref >90)
Glucose, Bld: 90 mg/dL (ref 70–99)
Potassium: 3.6 mEq/L — ABNORMAL LOW (ref 3.7–5.3)
Sodium: 143 mEq/L (ref 137–147)

## 2014-06-27 LAB — LIPID PANEL
Cholesterol: 116 mg/dL (ref 0–200)
HDL: 27 mg/dL — AB (ref >39)
LDL Cholesterol: 72 mg/dL (ref 0–99)
Total CHOL/HDL Ratio: 4.3 RATIO
Triglycerides: 84 mg/dL (ref <150)
VLDL: 17 mg/dL (ref 0–40)

## 2014-06-27 LAB — GLUCOSE, CAPILLARY
GLUCOSE-CAPILLARY: 125 mg/dL — AB (ref 70–99)
GLUCOSE-CAPILLARY: 87 mg/dL (ref 70–99)
Glucose-Capillary: 120 mg/dL — ABNORMAL HIGH (ref 70–99)
Glucose-Capillary: 121 mg/dL — ABNORMAL HIGH (ref 70–99)

## 2014-06-27 MED ORDER — OXYCODONE HCL ER 30 MG PO T12A: 30.0000 mg | EXTENDED_RELEASE_TABLET | Freq: Two times a day (BID) | ORAL | Status: AC

## 2014-06-27 MED ORDER — HYDRALAZINE HCL 10 MG PO TABS: 10.0000 mg | ORAL_TABLET | Freq: Three times a day (TID) | ORAL | Status: AC

## 2014-06-27 MED ORDER — NICOTINE 21 MG/24HR TD PT24: 21.0000 mg | MEDICATED_PATCH | Freq: Every day | TRANSDERMAL | Status: AC

## 2014-06-27 MED ORDER — POTASSIUM CHLORIDE CRYS ER 20 MEQ PO TBCR: 20.0000 meq | EXTENDED_RELEASE_TABLET | Freq: Every day | ORAL | Status: AC

## 2014-06-27 MED ORDER — TAMSULOSIN HCL 0.4 MG PO CAPS: 0.4000 mg | ORAL_CAPSULE | Freq: Every day | ORAL | Status: AC

## 2014-06-27 MED ORDER — SENNA 8.6 MG PO TABS: 2.0000 | ORAL_TABLET | Freq: Every day | ORAL | Status: AC

## 2014-06-27 MED ORDER — ISOSORBIDE MONONITRATE 15 MG HALF TABLET
15.0000 mg | ORAL_TABLET | Freq: Every day | ORAL | Status: DC
  Administered 2014-06-27: 15 mg via ORAL
  Filled 2014-06-27: qty 1

## 2014-06-27 MED ORDER — INSULIN GLARGINE 100 UNIT/ML ~~LOC~~ SOLN: 10.0000 [IU] | Freq: Two times a day (BID) | SUBCUTANEOUS | Status: AC

## 2014-06-27 MED ORDER — OXYCODONE-ACETAMINOPHEN 5-325 MG PO TABS: 1.0000 | ORAL_TABLET | ORAL | Status: AC | PRN

## 2014-06-27 MED ORDER — HYDRALAZINE HCL 10 MG PO TABS
10.0000 mg | ORAL_TABLET | Freq: Three times a day (TID) | ORAL | Status: DC
  Administered 2014-06-27 (×2): 10 mg via ORAL
  Filled 2014-06-27 (×4): qty 1

## 2014-06-27 MED ORDER — ISOSORBIDE MONONITRATE 15 MG HALF TABLET: 15.0000 mg | ORAL_TABLET | Freq: Every day | ORAL | Status: AC

## 2014-06-27 MED ORDER — FUROSEMIDE 40 MG PO TABS: 40.0000 mg | ORAL_TABLET | Freq: Two times a day (BID) | ORAL | Status: DC

## 2014-06-27 MED ORDER — POTASSIUM CHLORIDE CRYS ER 20 MEQ PO TBCR
20.0000 meq | EXTENDED_RELEASE_TABLET | Freq: Every day | ORAL | Status: DC
  Administered 2014-06-27: 20 meq via ORAL
  Filled 2014-06-27: qty 1

## 2014-06-27 MED ORDER — OXYCODONE HCL ER 15 MG PO T12A
30.0000 mg | EXTENDED_RELEASE_TABLET | Freq: Two times a day (BID) | ORAL | Status: DC
  Administered 2014-06-27: 30 mg via ORAL
  Filled 2014-06-27: qty 2

## 2014-06-27 NOTE — Progress Notes (Signed)
Clinical Social Work  CSW met with patient and dtr at bedside to explain bed offers. [facility] was unable to offer a bed. Dtr reports she wants to talk with friends to choose between [facility] or [facility]. Dtr aware that patient has been DC and will have a decision shortly. CSW will continue to follow.  [place], [place] [phone]

## 2014-06-27 NOTE — Discharge Instructions (Signed)

## 2014-06-27 NOTE — Progress Notes (Signed)
Clinical Social Work  MD reports patient might need a life vest at follow up appointment with cardiology. CSW spoke with offering facilities and [facility] is the only facility that can accept patient. CSW explained this information to patient and family. CSW explained if they are not satisfied with this facility then CSW could search other counties. Dtr reports she does not want to drive out of county so will choose [facility]. DC summary faxed to SNF and they are agreeable to accept today. DC packet prepared with DC summary, FL2, hard scripts and DNR. CSW will arrange transportation via [facility] after patient voids.  RN will contact CSW for transportation needs.  [place], [place] [phone]

## 2014-06-27 NOTE — Progress Notes (Signed)
Spoke with Dr. Tobias Alexander who is managing the patient's cardiology care while in the hospital. 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 The company has been contacted and arrangements are being made.  Theodore Demark, PA-C 06/27/2014 2:50 PM [place] [phone]

## 2014-06-27 NOTE — Progress Notes (Signed)
Patient receiving LifeVest application.  Report called to [facility] in [place].  Awaiting [facility] transport.  Family at bedside

## 2014-06-27 NOTE — Progress Notes (Signed)
Clinical Social Work  RN reports that patient will have life vest placed prior to DC. CSW updated SNF who remains agreeable to accept.   [place], [place] [phone]

## 2014-06-27 NOTE — Progress Notes (Addendum)
Patient Name: Patrick [name]. Date of Encounter: 06/27/2014  Principal Problem:   Acute on chronic systolic CHF (congestive heart failure), NYHA class 3 Active Problems:   DM type 2 causing CKD stage 3   Dark stools   COPD (chronic obstructive pulmonary disease)   CHF (congestive heart failure)   Acute respiratory failure   Pleural effusion   Acute encephalopathy   Acute systolic CHF (congestive heart failure)   Urinary retention   Acute on chronic systolic CHF (congestive heart failure)   Length of Stay: 4  SUBJECTIVE  Feels better, wants to go home.  CURRENT MEDS . aspirin  81 mg Oral Daily  . carvedilol  12.5 mg Oral BID WC  . furosemide  40 mg Oral BID  . insulin aspart  0-15 Units Subcutaneous 6 times per day  . milk and molasses  1 enema Rectal Once  . nicotine  21 mg Transdermal Daily  . OxyCODONE  60 mg Oral Q12H  . pantoprazole  40 mg Oral BID  . polyethylene glycol  17 g Oral Daily  . senna  2 tablet Oral Daily  . sodium chloride  3 mL Intravenous Q12H  . tiotropium  18 mcg Inhalation Daily    OBJECTIVE  Filed Vitals:   06/26/14 1355 06/26/14 2056 06/27/14 0500 06/27/14 0509  BP: 116/57 127/58  132/56  Pulse: 79 70  74  Temp: 98.1 F (36.7 C) 98.2 F (36.8 C)  97.9 F (36.6 C)  TempSrc: Oral Oral  Oral  Resp: Height:      Weight:   163 lb 5 oz (74.078 kg)   SpO2: 94% 95%  98%    Intake/Output Summary (Last 24 hours) at 06/27/14 0736 Last data filed at 06/26/14 1902  Gross per 24 hour  Intake    360 ml  Output   1000 ml  Net   -640 ml   Filed Weights   06/25/14 0400 06/26/14 0639 06/27/14 0500  Weight: 168 lb 10.4 oz (76.5 kg) 165 lb 6.4 oz (75.025 kg) 163 lb 5 oz (74.078 kg)    PHYSICAL EXAM  General: Pleasant, NAD. Neuro: Alert and oriented X 3. Moves all extremities spontaneously. Psych: Normal affect. HEENT:  Normal  Neck: Supple without bruits or JVD. Lungs:  Resp regular and unlabored, decreased breathing  sounds at the basis L>R, no wheezeing. Heart: RRR no s3, s4, or murmurs. Abdomen: Soft, non-tender,  Mildly distended, BS + x 4.  Extremities: No clubbing, cyanosis, + 1 edema B/L. DP/PT/Radials 2+ and equal bilaterally.  Accessory Clinical Findings  CBC  Recent Labs  06/24/14 1118 06/26/14 0445  WBC 8.4 7.7  HGB 13.0 11.0*  HCT 44.0 36.6*  MCV 85.6 84.1  PLT 289 253   Basic Metabolic Panel  Recent Labs  06/25/14 0808 06/26/14 0445 06/27/14 0448  NA 143 142 143  K 3.0* 3.3* 3.6*  CL 96 98 98  CO2 36* 35* 35*  GLUCOSE 61* 148* 90  BUN 27* 27* 30*  CREATININE 1.61* 1.55* 1.40*  CALCIUM 7.8* 7.8* 7.9*  PHOS  --  3.1  --    Liver Function Tests  Recent Labs  06/25/14 0808 06/26/14 0445  AST 21  --   ALT 13  --   ALKPHOS 82  --   BILITOT 0.3  --   PROT 5.7*  --   ALBUMIN 2.1* 2.2*   No results found for this basename: LIPASE, AMYLASE,  in the last 72 hours Cardiac Enzymes  Recent Labs  06/24/14 0836  TROPONINI <0.30   Hemoglobin A1C No results found for this basename: HGBA1C,  in the last 72 hours Thyroid Function Tests No results found for this basename: TSH, T4TOTAL, FREET3, T3FREE, THYROIDAB,  in the last 72 hours  Radiology/Studies  Ct Abdomen Pelvis Wo Contrast  06/23/2014   CLINICAL DATA:  Two days of abdominal pain and generalized weakness. Patient reports melena   IMPRESSION: 1. There is no evidence of bowel obstruction, enteritis, or acute colitis. No mural mass is demonstrated. There is sigmoid diverticulosis without definite evidence of acute diverticulitis. Certainly low-grade diverticulitis could be present without CT findings. There may be a fecal impaction given the moderately increased volume of stool in the rectum. 2. There is no acute hepatobiliary nor acute urinary tract abnormality. There are renal cysts on the right. 3. There is a large right pleural effusion with smaller left pleural effusion of uncertain etiology. There is diffusely  increased density in the subcutaneous fat consistent with anasarca. There is enlargement of the cardiac chambers.   Dg Chest Port 1 View  06/24/2014   CLINICAL DATA:  Large RIGHT pleural effusion post thoracentesis   IMPRESSION: Markedly decreased RIGHT pleural effusion and atelectasis post thoracentesis without pneumothorax.  Improved pulmonary edema.     Dg Chest Port 1 View  06/23/2014   IMPRESSION: Cardiomegaly.  Consolidative opacity within the right lower hemi thorax may represent elevation of the hemidiaphragm or potentially sub pulmonic effusion.  Perihilar interstitial opacities suggestive of edema in the acute setting.     US Thoracentesis Asp Pleural Space W/img Guide  06/24/2014   CLINICAL DATA:  RIGHT pleural effusion  EXAM: [place] RIGHT THORACENTESIS ASP PLEURAL SPACE W/IMG GUIDE  TECHNIQUE: Procedure, benefits, and risks of procedure were discussed with patient.  Written informed consent for procedure was obtained.  Time out protocol followed.  Pleural effusion localized by ultrasound at the posterior RIGHT hemithorax.  Skin prepped and draped in usual sterile fashion.  Skin and soft tissues anesthetized with 8 mL of 1% lidocaine.  8 French thoracentesis catheter placed into the RIGHT pleural space.  1700 mL of clear yellow fluid aspirated by syringe pump.  Procedure tolerated well by patient without immediate complication.  Fluid sent to laboratory for requested analysis.  COMPARISON:  Chest radiograph 06/23/2014, CT abdomen and pelvis 06/23/2014  FINDINGS: As above  IMPRESSION: Ultrasound guided RIGHT thoracentesis as above.   Electronically Signed   By: Ulyses Southward M.D.   On: 06/24/2014 12:54    TELE: Infrequent PVCs.    ASSESSMENT AND PLAN  1.Acute on Chronic Systolic CHF: EF of 25% per echo in 2013: Patient with anasarca, with paraphimosis, and urinary retention, s/p Foley placement, diuresed > 2.7 L with significant symptoms improvement. Crea worsening 1.2 --1.6 , now 1.4, most  probably sec to obstruction.  Continue Lasix PO, can be discharged today, we will follow in the clinic of his preference. We will hold ACEI as his Crea is still elevated and start Imdur 15 mg/Hydralazine 10 mg TID. His prognosis is poor, not a candidate for an ICD, however we will arrange for a LifeVest.   2. Bilateral pleural effusions: Thoracentesis yesterday with 1700 mL of clear yellow fluid aspirated by syringe pump.  Markedly decreased RIGHT pleural effusion and atelectasis post thoracentesis without pneumothorax.  Improved pulmonary edema.     3. COPD: Complicating breathing status in the setting of heart failure. He is currently  off BiPAP and seems comfortable.   4. Urinary Retention: Foley inserted, circumcision for paraphimosis considered.   5. Hypokalemia - replace, send home with KCL 20 mEq daily  Signed, Lars Masson MD, [facility] 06/27/2014

## 2014-06-27 NOTE — Telephone Encounter (Signed)
Attempted to contact pt regarding discharge from [facility] on 06/27/14.  Left message for pt to call back.

## 2014-06-27 NOTE — Discharge Summary (Signed)
Physician Discharge Summary  Patrick [name]. MVH:[id number] DOB: Dec 28, 1940 DOA: 06/23/2014  PCP: [name], MD  Admit date: 06/23/2014 Discharge date: 06/27/2014  Time spent: 35  minutes  Recommendations for Outpatient Follow-up:  1. Discharged to skilled nursing facility 2. Follow up with his cardiologist Dr [name] in 1 week ( appt scheduled for 07/05/1014). Pt maty not be a  candidate for AICD but will need a life vest. Add Aldactone if blood pressure stable during outpatient followup. 3. Follow up with his PCP and pain clinic in [facility]  Discharge Diagnoses:  Principal Problem:   Acute on chronic systolic CHF (congestive heart failure), NYHA class 3  Active Problems:   Cardiomyopathy, ischemic   DM type 2 causing CKD stage 3   COPD (chronic obstructive pulmonary disease)   Acute respiratory failure   Pleural effusion   Acute encephalopathy   Urinary retention   Phimosis   Hypokalemia   Anemia   TOBACCO ABUSE   Discharge Condition: Fair  Diet recommendation: Cardiac/diabetic  Filed Weights   06/25/14 0400 06/26/14 0639 06/27/14 0500  Weight: 76.5 kg (168 lb 10.4 oz) 75.025 kg (165 lb 6.4 oz) 74.078 kg (163 lb 5 oz)    History of present illness:  Please refer to admission H&P for details, but in brief,73 year old man with medical history chronic systolic congestive heart failure, LVEF 20-25%, generalized weakness, fall, opiate dependence for pain, left BKA, presented to [facility] ED for abdominal pain and dark stools. Workup of abdominal pain was unremarkable except for heme-positive stool but while in the emergency department he developed acute respiratory failure and lethargy and was admitted for acute respiratory acidosis, hypercapnia, large right pleural effusion and volume overload/anasarca. Patient further had urinary retention with phimosis with difficult catheterization requiring trasnfer to [facility] for urology evaluation at [facility] for foley placement.   Hospital  Course:  Acute CHF with Acute hypoxic and hypercarbic resp failure  secondary to acute exacerbation of severe cardiomyopathy and development fo b/l pleural effusion.. Patient required BiPAP on admission. Now improved on [place] after IV diuresis and thoracentesis. .  -2d echo with markedly worsend EF of 15-20 %. (Previously 25-30%) IV lasix switched to po symptoms improved and net negative balance of  2.7 liters. Symptoms much improved after right thoracentesis with 1.7L clear transudate  fluid removed on 9/21.  -Patient needs strict I/O monitoring and daily weights. Fluid restriction to 1500 cc.  -Okay for discharge per cardiology. Patient already on aspirin and carvedilol as outpatient. Given his CKD, cannot use ACE inhibitor. I have added low dose Imdur and hydralazine. Patient is already on statin as outpatient. -Potassium supplement added -If  blood pressure is stable he should be started on low dose Aldactone as outpatient -Patient seems to have noncompliance with medications and I have extensively discussed with him regarding diet and medication compliance as well as smoking cessation. He will follow up with his cardiologist in [place] next week.  -life vest will be arranged prior to discharge.   Acute urinary retention  -Patient presented with phimosis with difficulty placing a foley. He was  transferred from [facility] to [facility] for urology evaluation. -Foley catheter placed by urology on 9/21. Spoke with urologist Dr. [name] who recommends that acute retention is possibly related to BPH versus urethral stricture and unlikely from phimosis which patient likely has chronically. Recommended removing the Foley and monitor patient to be able to void comfortably prior to discharge. Also recommended adding flomax.  AKI On CKD  stage III  -Baseline creatinine of 1.24, creatinine mildly increased 1.6 after diuresis and possibly due to obstructive uropathy.  -Renal function improving with  Foley placement and Lasix changed to by mouth. Creatinine of 1.4 upon discharge. Monitor as outpatient.   Acute encephalopathy  possibly secondary to home narcotics for chr pain. Pt on high dose narcotics which were held an restarted at lower dose. Patient reports being on OxyContin 60 mg twice daily and oxycodone 1 mg every 4 hours as needed. I will continue him on his oxycodone dose and reduce his OxyContin to 30 mg twice daily as patient does have some confusion with narcotics.   Large right pleural effusion  -Likely secondary to CHF and fluid overload, status post thoracentesis done by IR with 1700 mL of transudative fluids removed   Diabetes mellitus with nephropathy  - A1c is 8.6  -Insulin sliding scale, patient was on 10 -12 units of Lantus twice a day which has been on hold . Blood glucose is stable. I will resume him on 10 units of Lantus once a day.   Black stool/constipation  -Hemoglobin stable, patient is on iron at home, constipation is secondary to chronic OxyContin at home  -CT scan of abdomen and pelvis showed moderate stool burden in the colon, questionable fecal impaction. Has good BM now. Continue daily senna. -h&h stable.   Hypokalemia  replenish   COPD  Stable, continue inhaler.    PVD,  status post left lower extremity and BKA with a nonhealing stump ulcer -Stable.  continue aspirin. Patient has a nonhealing ulcer to the left BKA stump patient was followed at [facility] wound Center. Seen by wound consult in the hospital and recommend "Cleanse ulcer to left BKA wound (right lateral aspect) with NS and pat gently dry. Apply moistened silver hydrofiber dressing to wound bed. Top with 3x3 Allevyn silicone border dressing. Change twice weekly. "    CAD  -Negative cardiac enzymes, continue aspirin, statin and beta blocker.  Code Status: DNR  Family Communication: Brother  at bedside . Spoke with daughter on the phone Disposition Plan: Seen by physical therapy and  recommended for skilled nursing facility.  Consultants:  Cardiology  Urology   Procedures:  Rt thoracetesis on 9/21    Antibiotics:  none     Discharge Exam: Filed Vitals:   06/27/14 0700  BP: 124/53  Pulse: 75  Temp: 97.7 F (36.5 C)  Resp: 20   General: Elderly male in NAD  HEENT: no pallor, moist mucosa  Chest: bibasilar crackles improved, no rhonchi or wheeze  CVS: Normal S1-S2, no murmurs Abd: soft, NT, ND, BS+  Ext: warm, left BKA  CNS: alert and oreinted    Discharge Instructions You were cared for by a hospitalist during your hospital stay. If you have any questions about your discharge medications or the care you received while you were in the hospital after you are discharged, you can call the unit and asked to speak with the hospitalist on call if the hospitalist that took care of you is not available. Once you are discharged, your primary care physician will handle any further medical issues. Please note that NO REFILLS for any discharge medications will be authorized once you are discharged, as it is imperative that you return to your primary care physician (or establish a relationship with a primary care physician if you do not have one) for your aftercare needs so that they can reassess your need for medications and monitor your lab values.  Current Discharge Medication List    START taking these medications   Details  furosemide (LASIX) 40 MG tablet Take 1 tablet (40 mg total) by mouth 2 (two) times daily. Qty: 30 tablet, Refills: 0    hydrALAZINE (APRESOLINE) 10 MG tablet Take 1 tablet (10 mg total) by mouth every 8 (eight) hours. Qty: 60 tablet, Refills: 0    isosorbide mononitrate (IMDUR) 15 mg TB24 24 hr tablet Take 0.5 tablets (15 mg total) by mouth daily. Qty: 30 tablet, Refills: 0    nicotine (NICODERM CQ - DOSED IN MG/24 HOURS) 21 mg/24hr patch Place 1 patch (21 mg total) onto the skin daily. Qty: 28 patch, Refills: 0    potassium  chloride SA (K-DUR,KLOR-CON) 20 MEQ tablet Take 1 tablet (20 mEq total) by mouth daily. Qty: 30 tablet, Refills: 0    senna (SENOKOT) 8.6 MG TABS tablet Take 2 tablets (17.2 mg total) by mouth daily. Qty: 30 each, Refills: 0    flomax 0.4 mg                                             1 tablet po daily   CONTINUE these medications which have CHANGED   Details  OxyCODONE 30 MG T12A Take 30 mg by mouth every 12 (twelve) hours. Qty: 60 tablet, Refills: 0    oxyCODONE-acetaminophen (PERCOCET/ROXICET) 5-325 MG per tablet Take 1 tablet by mouth every 4 (four) hours as needed for moderate pain or severe pain. Qty: 30 tablet, Refills: 0    insulin glargine (LANTUS) 100 UNIT/ML injection     Inject 10 Units into the skin daily at bedtime    CONTINUE these medications which have NOT CHANGED   Details  aspirin 81 MG tablet Take 81 mg by mouth daily.    B-D UF III MINI PEN NEEDLES 31G X 5 MM MISC USE TWICE DAILY AS DIRECTED Qty: 100 each, Refills: 2    carvedilol (COREG) 12.5 MG tablet TAKE 1 TABLET BY MOUTH TWICE DAILY WITH A MEAL Qty: 60 tablet, Refills: 5    dimenhyDRINATE (DRAMAMINE) 50 MG tablet Take 50 mg by mouth every 8 (eight) hours as needed for nausea.     esomeprazole (NEXIUM) 40 MG capsule Take 1 capsule (40 mg total) by mouth 2 (two) times daily. Qty: 180 capsule, Refills: 0    ferrous sulfate 325 (65 FE) MG tablet Take 1 tablet twice daily with a meal.    gabapentin (NEURONTIN) 300 MG capsule TAKE ONE CAPSULE BY MOUTH EVERY DAY Qty: 30 capsule, Refills: 0         Insulin Syringe-Needle U-100 (INSULIN SYRINGE 1CC/30GX5/16") 30G X 5/16" 1 ML MISC Use as directed Qty: 100 each, Refills: 5    ipratropium-albuterol (DUONEB) 0.5-2.5 (3) MG/3ML SOLN Take 3 mLs by nebulization every 4 (four) hours as needed (shortness of breath).     nitroGLYCERIN (NITROSTAT) 0.4 MG SL tablet Place 1 tablet (0.4 mg total) under the tongue every 5 (five) minutes as needed. Qty: 30 tablet,  Refills: 6   Associated Diagnoses: Chest pain    simvastatin (ZOCOR) 40 MG tablet TAKE 1 TABLET BY MOUTH EVERY NIGHT AT BEDTIME FOR CHOLESTEROL Qty: 30 tablet, Refills: 0    SPIRIVA HANDIHALER 18 MCG inhalation capsule INHALE CONTENTS OF 1 CAPSULE ONCE DAILY USING HANDIHALER AT 8AM Qty: 30 capsule, Refills: 5  STOP taking these medications     aspirin-sod bicarb-citric acid (ALKA-SELTZER) 325 MG TBEF tablet      torsemide (DEMADEX) 20 MG tablet      TDaP (BOOSTRIX) 5-2.5-18.5 LF-MCG/0.5 injection      zoster vaccine live, PF, (ZOSTAVAX) 81191 UNT/0.65ML injection        Allergies  Allergen Reactions  . Amoxicillin     Sick on stomach   Follow-up Information   Follow up with [facility], MD In 1 week. (after discharge from SNF)    Specialty:  Family Medicine   Contact information:   [address] [place] [address] [phone]       Follow up with Julien Nordmann, MD On 07/05/2014. (11:30 PM)    Specialty:  Cardiology   Contact information:   [address] [place] [address] [phone]        The results of significant diagnostics from this hospitalization (including imaging, microbiology, ancillary and laboratory) are listed below for reference.    Significant Diagnostic Studies: Ct Abdomen Pelvis Wo Contrast  06/23/2014   CLINICAL DATA:  Two days of abdominal pain and generalized weakness. Patient reports melena  EXAM: CT ABDOMEN AND PELVIS WITHOUT CONTRAST  TECHNIQUE: Multidetector CT imaging of the abdomen and pelvis was performed following the standard protocol without IV contrast. The patient ingested only a small amount of oral contrast.  COMPARISON:  Portable chest x-ray of today's date  FINDINGS: There is a large right pleural effusion with small left pleural effusion. There is mild atelectasis of the right lower lobe adjacent to the effusion. The cardiac chambers are mildly enlarged.  The stomach is partially distended with  nonopacified fluid. There is no evidence of a small bowel obstruction. Contrast has reached the colon. The stool and gas pattern is unremarkable. There is sigmoid diverticulosis. A moderate amount of stool in the rectal vault is demonstrated. There is no free extraluminal fluid or gas. The perisigmoid soft tissues exhibit no significant significant inflammatory changes.  The gallbladder is surgically absent. The liver, spleen, pancreas, adrenal glands, and kidneys exhibit no acute abnormalities. There at rounded exophytic hypodensities associated with the mid and upper poles of the right kidney which exhibit Hounsfield value is of between 4 in 17. There are no calcified urinary tract stones. The abdominal aorta exhibits dense mural calcification but no aneurysm. There is no periaortic or pericaval lymphadenopathy.  The urinary bladder, prostate gland, and seminal vesicles exhibit no acute abnormalities.  There is diffusely increased density in the subcutaneous fat consistent with anasarca. There is degenerative disc space narrowing at L4-5. There is no compression fracture. The bony pelvis exhibits no acute abnormalities.  IMPRESSION: 1. There is no evidence of bowel obstruction, enteritis, or acute colitis. No mural mass is demonstrated. There is sigmoid diverticulosis without definite evidence of acute diverticulitis. Certainly low-grade diverticulitis could be present without CT findings. There may be a fecal impaction given the moderately increased volume of stool in the rectum. 2. There is no acute hepatobiliary nor acute urinary tract abnormality. There are renal cysts on the right. 3. There is a large right pleural effusion with smaller left pleural effusion of uncertain etiology. There is diffusely increased density in the subcutaneous fat consistent with anasarca. There is enlargement of the cardiac chambers.   Electronically Signed   By: David  [place]   On: 06/23/2014 16:14   Dg Chest Port 1  View  06/24/2014   CLINICAL DATA:  Large RIGHT  pleural effusion post thoracentesis  EXAM: PORTABLE CHEST - 1 VIEW  COMPARISON:  Portable exam 1229 hr compared to 06/23/2014  FINDINGS: Enlargement of cardiac silhouette with pulmonary vascular congestion.  Postoperative changes of median sternotomy.  Atherosclerotic calcification aorta.  Markedly decreased RIGHT pleural effusion post thoracentesis and removal of 1700 mL of fluid from the RIGHT chest.  No pneumothorax.  Decreased RIGHT basilar atelectasis.  Improved pulmonary edema in LEFT upper lobe.  Bones demineralized.  IMPRESSION: Markedly decreased RIGHT pleural effusion and atelectasis post thoracentesis without pneumothorax.  Improved pulmonary edema.   Electronically Signed   By: Ulyses Southward M.D.   On: 06/24/2014 13:03   Dg Chest Port 1 View  06/23/2014   CLINICAL DATA:  Shortness of breath.  Weakness.  EXAM: PORTABLE CHEST - 1 VIEW  COMPARISON:  None.  FINDINGS: Cardiomegaly. Status post median sternotomy. Elevation of the right hemidiaphragm. Consolidative opacity within the right lower hemi thorax. Extensive perihilar interstitial pulmonary opacities. No definite pneumothorax.  IMPRESSION: Cardiomegaly.  Consolidative opacity within the right lower hemi thorax may represent elevation of the hemidiaphragm or potentially sub pulmonic effusion.  Perihilar interstitial opacities suggestive of edema in the acute setting.   Electronically Signed   By: Annia Belt M.D.   On: 06/23/2014 13:28   US Thoracentesis Asp Pleural Space W/img Guide  06/24/2014   CLINICAL DATA:  RIGHT pleural effusion  EXAM: [place] RIGHT THORACENTESIS ASP PLEURAL SPACE W/IMG GUIDE  TECHNIQUE: Procedure, benefits, and risks of procedure were discussed with patient.  Written informed consent for procedure was obtained.  Time out protocol followed.  Pleural effusion localized by ultrasound at the posterior RIGHT hemithorax.  Skin prepped and draped in usual sterile fashion.  Skin and soft  tissues anesthetized with 8 mL of 1% lidocaine.  8 French thoracentesis catheter placed into the RIGHT pleural space.  1700 mL of clear yellow fluid aspirated by syringe pump.  Procedure tolerated well by patient without immediate complication.  Fluid sent to laboratory for requested analysis.  COMPARISON:  Chest radiograph 06/23/2014, CT abdomen and pelvis 06/23/2014  FINDINGS: As above  IMPRESSION: Ultrasound guided RIGHT thoracentesis as above.   Electronically Signed   By: Ulyses Southward M.D.   On: 06/24/2014 12:54    Microbiology: Recent Results (from the past 240 hour(s))  MRSA PCR SCREENING     Status: None   Collection Time    06/23/14  3:10 PM      Result Value Ref Range Status   MRSA by PCR NEGATIVE  NEGATIVE Final   Comment:            The GeneXpert MRSA Assay (FDA     approved for NASAL specimens     only), is one component of a     comprehensive MRSA colonization     surveillance program. It is not     intended to diagnose MRSA     infection nor to guide or     monitor treatment for     MRSA infections.  BODY FLUID CULTURE     Status: None   Collection Time    06/24/14 12:15 PM      Result Value Ref Range Status   Specimen Description FLUID RIGHT PLEURAL   Final   Special Requests NONE   Final   Gram Stain     Final   Value: NO WBC SEEN     NO ORGANISMS SEEN     Performed at [facility]  Culture     Final   Value: NO GROWTH 2 DAYS     Performed at [facility]   Report Status PENDING   Incomplete     Labs: Basic Metabolic Panel:  Recent Labs Lab 06/23/14 1304 06/24/14 0302 06/25/14 0808 06/26/14 0445 06/27/14 0448  NA 142 142 143 142 143  K 3.8 3.8 3.0* 3.3* 3.6*  CL 98 96 96 98 98  CO2 35* 35* 36* 35* 35*  GLUCOSE 158* 131* 61* 148* 90  BUN 20 24* 27* 27* 30*  CREATININE 1.24 1.62* 1.61* 1.55* 1.40*  CALCIUM 8.6 8.6 7.8* 7.8* 7.9*  PHOS  --   --   --  3.1  --    Liver Function Tests:  Recent Labs Lab 06/23/14 1304 06/25/14 0808  06/26/14 0445  AST 36 21  --   ALT 14 13  --   ALKPHOS 89 82  --   BILITOT 0.5 0.3  --   PROT 6.7 5.7*  --   ALBUMIN 2.5* 2.1* 2.2*    Recent Labs Lab 06/23/14 1304  LIPASE 10*   No results found for this basename: AMMONIA,  in the last 168 hours CBC:  Recent Labs Lab 06/23/14 1304 06/24/14 1118 06/26/14 0445  WBC 5.0 8.4 7.7  NEUTROABS 3.7  --   --   HGB 11.5* 13.0 11.0*  HCT 38.2* 44.0 36.6*  MCV 84.7 85.6 84.1  PLT 276 289 253   Cardiac Enzymes:  Recent Labs Lab 06/23/14 1304 06/23/14 2152 06/24/14 0302 06/24/14 0836  TROPONINI <0.30 <0.30 <0.30 <0.30   BNP: BNP (last 3 results)  Recent Labs  10/25/13 1529 06/23/14 2152  PROBNP 16109* 37369.0*   CBG:  Recent Labs Lab 06/26/14 1612 06/26/14 1937 06/26/14 2353 06/27/14 0425 06/27/14 0733  GLUCAP 325* 329* 87 125* 120*       Signed:  Georgine Wiltse  [facility] 06/27/2014, 9:35 AM

## 2014-06-27 NOTE — Progress Notes (Signed)
Clinical Social Work  Per Charity fundraiser, patient is ready to DC. CSW arranged for [facility] to transport patient. CSW is signing off but available if needed.  [place], [place] [phone]

## 2014-06-28 NOTE — Telephone Encounter (Signed)
Patient contacted regarding discharge from [facility] on 06/27/14.  Patient understands to follow up with Dr. [name] on 07/05/14 at 11:30 at [facility]. Patient understands discharge instructions? yes Patient understands medications and regiment? yes Patient understands to bring all medications to this visit? yes

## 2014-07-05 ENCOUNTER — Encounter: Payer: Self-pay | Admitting: Cardiovascular Disease

## 2014-07-05 ENCOUNTER — Ambulatory Visit (INDEPENDENT_AMBULATORY_CARE_PROVIDER_SITE_OTHER): Payer: Medicare Other | Admitting: Cardiovascular Disease

## 2014-07-05 VITALS — BP 102/60 | HR 77 | Ht 70.0 in | Wt 180.0 lb

## 2014-07-05 DIAGNOSIS — I255 Ischemic cardiomyopathy: Secondary | ICD-10-CM

## 2014-07-05 DIAGNOSIS — R0602 Shortness of breath: Secondary | ICD-10-CM

## 2014-07-05 DIAGNOSIS — R609 Edema, unspecified: Secondary | ICD-10-CM

## 2014-07-05 DIAGNOSIS — E785 Hyperlipidemia, unspecified: Secondary | ICD-10-CM

## 2014-07-05 DIAGNOSIS — I25810 Atherosclerosis of coronary artery bypass graft(s) without angina pectoris: Secondary | ICD-10-CM

## 2014-07-05 DIAGNOSIS — I739 Peripheral vascular disease, unspecified: Secondary | ICD-10-CM

## 2014-07-05 DIAGNOSIS — I5023 Acute on chronic systolic (congestive) heart failure: Secondary | ICD-10-CM

## 2014-07-05 NOTE — Progress Notes (Signed)
Patient ID: Patrick HughesBoyd A Campoy Jr., male    DOB: 30-May-1941, 73 y.o.   MRN: [id number]  HPI Comments: Mr. 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 He was admitted with systolic CHF. He also had renal dysfunction likely secondary to long-standing poorly controlled diabetes. Hemoglobin A1c previously 10,  down to 9. Previous Echocardiogram at that time showed severely depressed and weak function of less than 25%, diastolic dysfunction, moderately elevated right ventricular systolic pressures Last seen in January 2015. At that time he was changed from Lasix to torsemide for worsening systolic heart failure He was lost to followup  He presents today after recent hospital admission. He presented to [facility] for urinary retention, systolic heart failure symptoms. [facility] Foley catheter [facility], started on milrinone, aggressive diuresis. ATN with acute renal dysfunction. He had a large left pleural effusion, status post thoracentesis with 1700 cc removed Discharged on Lasix 40 mg twice a day. He was in the hospital from September 20 until 06/27/2014. Potassium was also low in the hospital Now living at [facility]. He has been there for one week now with 9 pound weight gain. They do restrict his fluid intake per the daughter. She reports that Lasix is just not working for him as torsemide had in the past. He has severe bilateral lower extremity swelling, weight at discharge was 169 pounds, now 180 pounds. He has chronic baseline shortness of breath.     Outpatient Encounter Prescriptions as of  07/05/2014  Medication Sig  . ALPRAZolam (XANAX) 0.25 MG tablet Take 0.25 mg by mouth every 8 (eight) hours as needed for anxiety.  Marland Kitchen. aspirin 81 MG tablet Take 81 mg by mouth daily.  . B-D UF III MINI PEN NEEDLES 31G X 5 MM MISC USE TWICE DAILY AS DIRECTED  . carvedilol (COREG) 12.5 MG tablet TAKE 1 TABLET BY MOUTH TWICE DAILY WITH A MEAL  . dimenhyDRINATE (DRAMAMINE) 50 MG tablet Take 50 mg by mouth every 8 (eight) hours as needed for nausea.   Marland Kitchen. esomeprazole (NEXIUM) 40 MG capsule Take 1 capsule (40 mg total) by mouth 2 (two) times daily.  . ferrous sulfate 325 (65 FE) MG tablet Take 1 tablet twice daily with a meal.  . furosemide (LASIX) 40 MG tablet Take 80 mg by mouth 2 (two) times daily.  Marland Kitchen. gabapentin (NEURONTIN) 300 MG capsule TAKE ONE CAPSULE BY MOUTH EVERY DAY  . hydrALAZINE (APRESOLINE) 10 MG tablet Take 1 tablet (10 mg total) by mouth every 8 (eight) hours.  . insulin glargine (LANTUS) 100 UNIT/ML injection Inject 0.1 mLs (10 Units total) into the skin 2 (two) times daily.  . Insulin Syringe-Needle U-100 (INSULIN SYRINGE 1CC/30GX5/16") 30G X 5/16" 1 ML MISC Use as directed  . ipratropium-albuterol (DUONEB) 0.5-2.5 (3) MG/3ML SOLN Take 3 mLs by nebulization every 4 (four) hours as needed (shortness of breath).   . isosorbide mononitrate (IMDUR) 15 mg TB24 24 hr tablet Take 0.5 tablets (15 mg total) by mouth daily.  . Magnesium 400 MG CAPS Take 400 mg by mouth 3 (three) times daily.  . nicotine (NICODERM CQ -  DOSED IN MG/24 HOURS) 21 mg/24hr patch Place 1 patch (21 mg total) onto the skin daily.  . nitroGLYCERIN (NITROSTAT) 0.4 MG SL tablet Place 1 tablet (0.4 mg total) under the tongue every 5 (five) minutes as needed.  . OxyCODONE 30 MG T12A Take 30 mg by mouth every 12 (twelve) hours.  Marland Kitchen oxyCODONE-acetaminophen (PERCOCET/ROXICET) 5-325 MG per tablet Take 1 tablet by mouth every 4 (four) hours as needed for moderate pain or severe pain.  . potassium chloride SA (K-DUR,KLOR-CON) 20 MEQ  tablet Take 1 tablet (20 mEq total) by mouth daily.  . promethazine (PHENERGAN) 12.5 MG tablet Take 12.5 mg by mouth every 6 (six) hours as needed for nausea or vomiting.  . senna (SENOKOT) 8.6 MG TABS tablet Take 2 tablets (17.2 mg total) by mouth daily.  . simvastatin (ZOCOR) 40 MG tablet TAKE 1 TABLET BY MOUTH EVERY NIGHT AT BEDTIME FOR CHOLESTEROL  . SPIRIVA HANDIHALER 18 MCG inhalation capsule INHALE CONTENTS OF 1 CAPSULE ONCE DAILY USING HANDIHALER AT 8AM  . tamsulosin (FLOMAX) 0.4 MG CAPS capsule Take 1 capsule (0.4 mg total) by mouth daily after supper.    Review of Systems  Constitutional: Negative.   HENT: Negative.   Eyes: Positive for visual disturbance.  Respiratory: Positive for cough and shortness of breath.   Cardiovascular: Positive for leg swelling.  Gastrointestinal: Negative.   Endocrine: Negative.   Musculoskeletal: Negative.   Skin: Negative.   Allergic/Immunologic: Negative.   Neurological: Negative.   Hematological: Negative.   Psychiatric/Behavioral: Negative.   All other systems reviewed and are negative.  BP 102/60  Pulse 77  Ht 5\' 10"  (1.778 m)  Wt 180 lb (81.647 kg)  BMI 25.83 kg/m2  Physical Exam  Nursing note and vitals reviewed. Constitutional: He is oriented to person, place, and time. He appears well-developed and well-nourished.  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  Nose: Nose normal.  Mouth/Throat: Oropharynx is clear and moist.  Eyes: Conjunctivae are normal. Pupils are equal, round, and reactive to light.  Neck: Normal range of motion. Neck supple. No JVD present. Carotid bruit is present.  Cardiovascular: Normal rate, regular rhythm, S1 normal, S2 normal and intact distal pulses.  Exam reveals no gallop and no friction rub.   Murmur heard.  Crescendo systolic murmur is present with a grade of 2/6  2+ pitting edema, "woody" extending to the upper thighs, mild abdominal swelling  Pulmonary/Chest: Effort  normal. No respiratory distress. He has decreased breath sounds. He has no wheezes. He has no rales. He exhibits no tenderness.  Abdominal: Soft. Bowel sounds are normal. He exhibits no distension. There is no tenderness.  Musculoskeletal: Normal range of motion. He exhibits no edema and no tenderness.  Lymphadenopathy:    He has no cervical adenopathy.  Neurological: He is alert and oriented to person, place, and time. Coordination normal.  Skin: Skin is warm and dry. No rash noted. No erythema.  Psychiatric: He has a normal mood and affect. His behavior is normal. Judgment and thought content normal.      Assessment and Plan

## 2014-07-05 NOTE — Assessment & Plan Note (Signed)
Encouraged him to stay on his simvastatin daily

## 2014-07-05 NOTE — Assessment & Plan Note (Signed)
Currently with no symptoms of angina. No further workup at this time. Continue current medication regimen. 

## 2014-07-05 NOTE — Assessment & Plan Note (Addendum)
Acute on chronic systolic heart failure after recent discharge from the hospital Unclear why he was discharged on Lasix when he was admitted on torsemide. We will discontinue the Lasix, change back to torsemide 80 mg twice a day with parameters for weight less than 171 pounds If he does not have improvement with the regimen below, we may need to consider outpatient milrinone infusion.  PICC line Would be needed  Details below given to the patient;  Please hold the lasix Start torsemide 80 mg twice a day Increase potassium up to 20 mg twice a day  For weight less than 171, Decrease the torsemide down to 80 mg daily Take only one potassium pill  For weight > 183, Give metolazone 5 mg in the Am,  30 minutes before the morning torsemide. Continue torsemide 80 mg twice a day

## 2014-07-05 NOTE — Assessment & Plan Note (Signed)
Underlying severe carotid arterial disease, managed by Dr. dew

## 2014-07-05 NOTE — Patient Instructions (Signed)
You are having severe fluid retention, congestive heart failure  Please hold the lasix Start torsemide 80 mg twice a day Increase potassium up to 20 mg twice a day  For weight less than 171, Decrease the torsemide down to 80 mg daily Take only one potassium pill  For weight > 183, Give metolazone 5 mg in the Am,  30 minutes before the morning torsemide. Continue torsemide 80 mg twice a day  Please call us if you have new issues that need to be addressed before your next appt.  Your physician wants you to follow-up in: 1 month

## 2014-07-05 NOTE — Assessment & Plan Note (Signed)
Severe bilateral leg edema from acute on chronic systolic CHF. Need more aggressive diuretic regimen

## 2014-07-05 NOTE — Assessment & Plan Note (Signed)
He currently has a life vest on. Discussion today. He has no desire to have implantable ICD. He has discuss this with us in the past. This is certainly his choice. He is welcome to wear the best as long as he tolerates

## 2014-07-11 ENCOUNTER — Other Ambulatory Visit (HOSPITAL_COMMUNITY): Payer: Self-pay | Admitting: Internal Medicine

## 2014-07-25 ENCOUNTER — Telehealth: Payer: Self-pay | Admitting: Internal Medicine

## 2014-07-25 NOTE — Telephone Encounter (Signed)
Please call Mr. Patrick [name] and find out if he intends to return here for primary care.  .  He has not followed up with me since he violated his narcotics contract, and is overdue for diabetes follow up.  Last seen in June .   i am continually getting Home health orders for him and have not seen him since June

## 2014-07-26 NOTE — Telephone Encounter (Signed)
Left message for patient to return call to office. 

## 2014-07-29 NOTE — Telephone Encounter (Signed)
Left message for patient to call office.  

## 2014-07-29 NOTE — Telephone Encounter (Signed)
Patient not responding to calls would you like me to send letter?

## 2014-07-29 NOTE — Telephone Encounter (Signed)
Yes,  i will stop signing his home health forms if he does not respond

## 2014-07-29 NOTE — Telephone Encounter (Signed)
Letter mailed

## 2014-08-04 DEATH — deceased

## 2014-08-21 ENCOUNTER — Ambulatory Visit: Payer: Medicare Other | Admitting: Cardiovascular Disease

## 2014-10-15 ENCOUNTER — Encounter: Payer: Self-pay | Admitting: *Deleted

## 2015-01-24 NOTE — Op Note (Signed)
PATIENT NAME:  Patrick [name], Patrick [name] MR#:  [id number] DATE OF BIRTH:  July 11, 1941  DATE OF PROCEDURE:  12/12/2012  LOCATION:  [facility]  PREOPERATIVE DIAGNOSIS: Visually significant cataract of the right eye.   POSTOPERATIVE DIAGNOSIS: Visually significant cataract of the right eye.   OPERATIVE PROCEDURE: Cataract extraction by phacoemulsification with implant of intraocular lens to right eye.   SURGEON: Galen ManilaWilliam Lucella Pommier, MD.   ANESTHESIA:  1. Managed anesthesia care.  2. Topical tetracaine drops followed by 2% Xylocaine jelly applied in the preoperative holding area.   COMPLICATIONS: None.   TECHNIQUE:  Stop and chop.   DESCRIPTION OF PROCEDURE: The patient was examined and consented in the preoperative holding area where the aforementioned topical anesthesia was applied to the right eye and then brought back to the Operating Room where the right eye was prepped and draped in the usual sterile ophthalmic fashion and [name] lid speculum was placed. [name] paracentesis was created with the side port blade and the anterior chamber was filled with viscoelastic. [name] near clear corneal incision was performed with the steel keratome. [name] continuous curvilinear capsulorrhexis was performed with [name] cystotome followed by the capsulorrhexis forceps. Hydrodissection and hydrodelineation were carried out with BSS on [name] blunt cannula. The lens was removed in [name] stop and chop technique and the remaining cortical material was removed with the irrigation-aspiration handpiece. The capsular bag was inflated with viscoelastic and the Alcon SN60WF Tecnis ZCB0024.0-diopter lens, serial number [id number] was placed in the capsular bag without complication. The remaining viscoelastic was removed from the eye with the irrigation-aspiration handpiece. The wounds were hydrated. The anterior chamber was flushed with Miostat and the eye was inflated to physiologic pressure. 0.1 mL of cefuroxime concentration 10 mg/mL was placed in the  anterior chamber. The wounds were found to be water tight. The eye was dressed with Vigamox and Combigan. The patient was given protective glasses to wear throughout the day and [name] shield with which to sleep tonight. The patient was also given drops with which to begin [name] drop regimen today and will follow-up with me in one day.    ____________________________ Jerilee FieldWilliam L. Loraine Bhullar, MD wlp:ea D: 12/12/2012 21:52:01 ET T: 12/12/2012 23:58:38 ET JOB#: [id number]  cc: Nhan Qualley L. Destry Dauber, MD, <Dictator> Jerilee FieldWILLIAM L Santiago Graf MD ELECTRONICALLY SIGNED 12/15/2012 17:10

## 2015-01-24 NOTE — Op Note (Signed)
PATIENT NAME:  Patrick [name], Patrick [name] MR#:  [id number] DATE OF BIRTH:  [birth year]  DATE OF PROCEDURE:  03/19/2013  PREOPERATIVE DIAGNOSES:  1.  Peripheral arterial disease with ulceration of bilateral lower extremities.  2.  Coronary disease.  3.  Hypertension.  4.  Tobacco dependence.  5.  Status post right lower extremity revascularization.   POSTOPERATIVE DIAGNOSES: 1.  Peripheral arterial disease with ulceration of bilateral lower extremities.  2.  Coronary disease.  3.  Hypertension.  4.  Tobacco dependence.  5.  Status post right lower extremity revascularization.   PROCEDURE: 1.  Ultrasound guidance for vascular access, right femoral artery.  2.  Left lower extremity angiogram.  3.  Crosser atherectomy to the left superficial femoral artery.  4.  StarClose closure device, right femoral artery.   SURGEON: Annice NeedyJason S Brandn Mcgath, M.D.   ANESTHESIA: Local with moderate conscious sedation.   ESTIMATED BLOOD LOSS: Approximately 25 mL.  FLUOROSCOPY TIME: 27 minutes.   CONTRAST USED: 55 mL.   INDICATION FOR PROCEDURE: [name] with extensive peripheral vascular disease bilaterally. He has undergone bilateral lower extremity angiograms. His left lower extremity occlusion was not able to be crossed last time. He is brought back for [name] repeat attempt at crossing this with the crosser atherectomy device for limb salvage. The risks and benefits were discussed. Informed consent was obtained.   DESCRIPTION OF PROCEDURE: The patient is brought to the vascular interventional radiology suite. Groins were shaved and prepped and [name] sterile surgical field was created. Ultrasound was used to visualize the right femoral artery and this was accessed direct ultrasound guidance without difficulty with Seldinger needle with advantage wire and 6-French Ansell sheath was placed. I went ahead and crossed the aortic bifurcation as the iliacs had been shown to be patent previously and left lower  extremity angiogram was then performed. This showed the same SFA occlusion seen previously. I brought to the crosser atherectomy device with the support catheter onto the field and began atherectomy of the left superficial femoral artery. I was able to track about 6 to 8 cm down through the occlusion with the atherectomy device, but the catheter buckle would not cross the cap that the atherectomy device had bored. We were only about half the length of the occlusion at this point. Multiple attempts were made to continue atherectomy, but this would not pass without catheter support. I then tried to re-enter the lumen distally with multiple different wires and catheters unsuccessfully. He still had maintained runoff distally. At this point, I elected to abandon the procedure and will consider him for surgical bypass. The sheaths were pulled back to the ipsilateral external iliac artery and oblique arteriogram was performed. StarClose closure device was deployed in the usual fashion with excellent hemostatic result.    ____________________________ Annice NeedyJason S. Taylor Levick, MD jsd:aw D: 03/19/2013 11:46:45 ET T: 03/19/2013 12:00:28 ET JOB#: [id number]  cc: Annice NeedyJason S. Hydia Copelin, MD, <Dictator> Annice NeedyJASON S Shemeika Starzyk MD ELECTRONICALLY SIGNED 03/29/2013 14:41

## 2015-01-24 NOTE — Op Note (Signed)
PATIENT NAME:  Patrick [name], Patrick [name] MR#:  [id number] DATE OF BIRTH:  06/20/1941  DATE OF PROCEDURE:  03/12/2013  PREOPERATIVE DIAGNOSES: 1.  Peripheral arterial disease with ulceration, bilateral lower extremities.  2.  Diabetes.  3.  Hypertension.   POSTOPERATIVE DIAGNOSES: 1.  Peripheral arterial disease with ulceration, bilateral lower extremities.  2.  Diabetes.  3.  Hypertension.   PROCEDURE: 1.  Catheter placement into right anterior tibial and peroneal arteries from left femoral approach.  2.  Right lower extremity angiogram.  3.  Percutaneous transluminal angioplasty of proximal and mid superficial femoral artery with a 5 mm diameter angioplasty balloon.  4.  Percutaneous transluminal angioplasty of peroneal artery with 3 mm diameter angioplasty balloon.  5.  Percutaneous transluminal angioplasty of right anterior tibial artery with 3 mm diameter angioplasty balloon.  6.  StarClose closure device left femoral artery.   SURGEON: Annice Needy, MD   ANESTHESIA: Local with moderate conscious sedation.   ESTIMATED BLOOD LOSS: Approximately 25 mL.   INDICATION FOR PROCEDURE: A 74 year old white male with extensive peripheral vascular disease. He has bilateral lower extremity ulcerations. He has had an angiogram in left lower extremity and is being brought back for a Crosser atherectomy of the left lower extremity next week, but he also has ulceration of the right lower extremity and is brought in today for evaluation and treatment of his PAD on the right. The risks and benefits were discussed. Informed consent was obtained.   DESCRIPTION OF PROCEDURE: The patient is brought to the vascular interventional radiology suite. Groins were shaved and prepped, and a sterile surgical field was created. The left femoral head was localized with fluoroscopy, and the left femoral artery was accessed without difficulty with a Seldinger needle. A J-wire and 5-French sheath was placed. A RIM catheter was used  to cross the aortic bifurcation, and aortogram was not performed as he had this performed recently.  I advanced to the right femoral head. Selective right lower extremity angiogram was then performed. This showed an approximately 70% stenosis at the origin of the superficial femoral artery. There was a 56% stenosis just above the previously-placed stent in the proximal to mid superficial femoral artery. The popliteal artery was patent. He did have significant tibial disease with stenosis in both his peroneal artery and anterior tibial artery that were the runoff to the foot. The posterior tibial artery was smaller and diseased. The patient was heparinized. A 6-French Ansell sheath was placed over a [employer]. I exchanged for an 0.018 wire. I initially used a Kumpe catheter and an 0.018 wire to cross the lesion from the peroneal artery and tibioperoneal trunk and inflated a 3 mm diameter angioplasty balloon from the tibioperoneal trunk down to the mid peroneal artery. Several areas of waist were taken which resolved with angioplasty. I then selectively cannulated the anterior tibial artery. This had a 60 to 70% stenosis at the bend of the anterior tibial artery and its proximal segment, and the 3 mm diameter angioplasty balloon was used to treat this area as well as a moderate stenosis about 10 cm further down in the anterior tibial artery. Both areas had waists that resolved with angioplasty, and angiogram following angioplasty showed these areas to have significantly improved flow with better perfusion to the foot. The 2 SFA lesions were then treated with a 5 mm diameter angioplasty balloon. A waist was taken in both locations and resolved with angioplasty. Completion angiogram showed an approximately 30% residual stenosis after  angioplasty at the origin of the superficial femoral artery and approximately a 20% stenosis just above the previously placed stent.  Neither were flow limiting. At this point,  I elected to terminate the procedure. The sheath was pulled back to the ipsilateral external iliac artery and oblique arteriogram was performed. A StarClose closure device was deployed in the usual fashion with excellent hemostatic result. The patient tolerated the procedure well and was taken to the recovery room in stable condition.   ____________________________ Annice NeedyJason S. Dew, MD jsd:cb D: 03/12/2013 14:40:16 ET T: 03/12/2013 15:32:35 ET JOB#: [id number]  cc: Annice NeedyJason S. Dew, MD, <Dictator> Duncan Dulleresa Tullo, MD Annice NeedyJASON S DEW MD ELECTRONICALLY SIGNED 03/14/2013 10:59

## 2015-01-24 NOTE — Op Note (Signed)
PATIENT NAME:  Patrick [name], Patrick [name] MR#:  [id number] DATE OF BIRTH:  1941-07-09  DATE OF PROCEDURE:  03/23/2013  PREOPERATIVE DIAGNOSIS: Right third toe gangrene.   POSTOPERATIVE DIAGNOSIS: Right third toe gangrene.  PROCEDURE: Amputation right third toe metatarsophalangeal joint.   SURGEON: Kynlea Blackston [name]. Ether GriffinsFowler, DPM.   ANESTHESIA: MAC with local.   HEMOSTASIS: None.   COMPLICATIONS: None.   SPECIMEN: Right gangrenous third toe.   ESTIMATED BLOOD LOSS: Minimal.   OPERATIVE INDICATIONS: This is [name], who has developed gangrenous changes to his right third toe. He has undergone vascular intervention on his right leg for increased circulation and presents today for surgical amputation of his third toe today.   DESCRIPTION OF PROCEDURE: The patient was brought into the operating room and placed on the operating table in the supine position. IV sedation was administered by the anesthesia team. [name] local block was placed around the right third MTPJ consisting of total 7 mL of 1% lidocaine and 0.5% Marcaine in [name] 50/50 mix. After sterile prep and drape, attention was directed to the third toe where [name] fishmouth circumferential-type of incision was made medial and lateral. The toe was then disarticulated at the MTPJ sharply. No signs of any infectious processes were noted deep in the areas. [name] small amount of bleeding was noted. [name] few bleeders were Bovie cauterized as needed. The wound was then flushed with copious amounts of irrigation. Layered closure was performed with [name] 4-0 Vicryl for the deeper layer and [name] 3-0 nylon for skin. [name] well compressive sterile bulky dressing was then placed around the patient's right foot and ankle. The patient tolerated the procedure and anesthesia well and was transported from the OR to the PACU with all vital signs stable, neurovascular status intact. I will see him in the outpatient clinic in 5 to 7 days. He is to remain minimally weight-bearing on this foot for  the next several days.   ____________________________ Argentina DonovanJustin [name]. Ether GriffinsFowler, DPM jaf:aw D: 03/23/2013 08:25:24 ET T: 03/23/2013 08:36:52 ET JOB#: [id number]  cc: Jill AlexandersJustin [name]. Ether GriffinsFowler, DPM, <Dictator> Samnang Shugars DPM ELECTRONICALLY SIGNED 03/28/2013 12:46

## 2015-01-24 NOTE — Op Note (Signed)
PATIENT NAME:  Patrick [name], Patrick [name] MR#:  [id number] DATE OF BIRTH:  June 12, 1941  DATE OF PROCEDURE:  03/05/2013  DATE OF OPERATION: 03/05/2013   PREOPERATIVE DIAGNOSES: 1.  Peripheral arterial disease with ulceration, bilateral lower extremities.  2.  Hypertension.  3.  CKD  POSTOPERATIVE DIAGNOSES: 1.  Peripheral arterial disease with ulceration, bilateral lower extremities.  2.  Hypertension.  3.  CKD  PROCEDURES: 1.  Ultrasound guidance for vascular access, right femoral artery.  2.  Catheter placement in the left superficial femoral artery from right femoral approach.  3.  Aortogram, selective left lower extremity angiogram.  4.  StarClose closure device, right femoral artery.   SURGEON: Novelle Addair.   ANESTHESIA: Local, with moderate conscious sedation.   BLOOD LOSS: Approximately 50 mL.   FLUOROSCOPY TIME: 22 minutes, and 70 mL contrast were used.   INDICATION FOR PROCEDURE: [name] 74 year old white male with multiple ongoing issues, including chronic kidney disease and severe peripheral vascular disease. He has ulcerations of his bilateral lower extremities and he has reduced perfusion, worse on the left than the right. He was brought in for an angiogram for further evaluation. Risks and benefits were discussed. Informed consent was obtained.   DESCRIPTION OF PROCEDURE: The patient was brought to the vascular radiology suite where he was shaved and prepped and [name] sterile surgical field was created. The right femoral head was visualized with fluoroscopy and the right femoral artery was visualized with ultrasound and accessed under direct ultrasound guidance without difficulty with [name] Seldinger needle. [name] J wire and 5-French sheath were placed. Pigtail catheter was placed at the level of the L1 level and an AP aortogram was performed. This showed [name] patent left iliac stent. The aorta was diffusely calcified, but not stenotic. The right iliac had mild-to-moderate stenosis that did not appear flow-limiting,  but specific pelvic obliques were not performed due to contrast limitations, and we have [name] planned angiogram of the right lower extremity, at which time these will be further evaluated.   I then hooked the aortic bifurcation and advanced to the left femoral head and [name] selective left lower extremity angiogram was then performed. This demonstrated an occlusion of the SFA in the mid- to proximal segment. There was [name] densely calcified artery that reconstituted the distal superficial femoral artery. He then appeared to have 2-vessel runoff distally. I then placed [name]   6-French Ansel sheath over [name] Terumo Advantage wire. I heparinized the patient. I then attempted to cross the occlusion with typical maneuvers including an Advantage wire and an 0.018 wire, [name] CXI catheter and [name] Kumpe catheter and was never able to regain intraluminal access to the artery. Multiple attempts were made. Several different obliques and magnifications were used, but I could not cross the occlusion and regain wire access intraluminally beyond the occlusion. When it was clear this was going to be an unsuccessful attempt I considered using the crosser atherectomy device, but had already created multiple channels and did not think this would be [name] great option; I will bring him back an attempt this another day.   The sheath was pulled back to the ipsilateral external iliac artery. Oblique arteriogram was performed. The StarClose closure device was deployed in the usual fashion with excellent hemostatic result. The patient tolerated the procedure well and was taken to the recovery room in stable condition.    ____________________________ Annice NeedyJason S. Daveena Elmore, MD jsd:dm D: 03/05/2013 11:07:26 ET T: 03/05/2013 12:25:58 ET JOB#: [id number]  cc: Annice NeedyJason S. Besan Ketchem, MD, <Dictator>  Ardelle Anton, DPM Duncan Dull, MD Annice Needy MD ELECTRONICALLY SIGNED 03/14/2013 10:56

## 2015-01-25 NOTE — Discharge Summary (Signed)
PATIENT NAME:  Patrick [name], [name] MR#:  [id number] DATE OF BIRTH:  06/04/1941  DATE OF ADMISSION:  02/18/2014 DATE OF DISCHARGE:  02/19/2014  DISCHARGE DIAGNOSIS: Right leg cellulitis, much improved on antibiotics.   SECONDARY DIAGNOSES: 1.  Hypertension.  2.  Congestive heart failure.  3.  Chronic systolic heart failure with ejection fraction of 45%.  4.  Type 2 diabetes.  5.  Hyperlipidemia.  6.  History of cerebrovascular accident and peripheral neuropathy.    CONSULTATIONS: None.   PROCEDURES AND RADIOLOGY: Chest x-ray on 18th of May showed congestive heart failure, no airspace conservation.   Right tibia and fibula x-ray on 18th of May showed no acute abnormalities.   Major laboratory panel: Blood cultures x2 were negative. Wound culture had no organisms seen.   HISTORY AND SHORT HOSPITAL COURSE: The patient is a 74 year old male with the above-mentioned medical problems, who was admitted for right leg cellulitis. Please see Dr. Suzanne BoronKonidena's dictated history and physical for further details. The patient was started on IV antibiotics, and responded very well to the same. His blood cultures remained negative. His cellulitis was under much better control. He was discharged home on oral antibiotics on 19th of May after discussion with patient and his family member, who was in agreement with discharge planning.   DISCHARGE PHYSICAL EXAMINATION: VITAL SIGNS: Temperature 98.4, heart rate 66 per minute, respirations 18 per minute, blood pressure 157/72 mmHg. He was saturating 93% on room air.  CARDIOVASCULAR: S1, S2 normal. No murmurs, rubs or gallops.  LUNGS: Clear to auscultation bilaterally. No wheezing, rales, rhonchi, or crepitation.  ABDOMEN: Soft, benign.  NEUROLOGIC: Nonfocal examination.  LOWER EXTREMITIES: The patient has a left below-knee amputation. On the right lower extremity, he had minimal erythema but no swelling, and erythema had improved significantly compared to admission.  There was no tenderness. All other physical examination remained at the baseline.   DISCHARGE MEDICATIONS: 1.  Aspirin 81 mg p.o. daily.  2.  Spiriva once daily.  3.  Coreg 12.5 mg p.o. b.i.d.  4.  Colace 1 capsule p.o. b.i.d.  5.  Ferrous sulfate 325 mg p.o. b.i.d.  6.  Insulin Lantus 25 units subcutaneous at bedtime.  7.  OxyContin 60 mg p.o. b.i.d.  8.  Simvastatin 40 mg p.o. at bedtime.  9.  Tylenol 650 mg p.o. 3 times a day.  10.  Oxycodone 10 mg p.o. every four hours as needed.  11.  Remeron 15 mg p.o. at bedtime.  12.  Trazodone 50 mg 1/2 tablet p.o. daily.  13.  Levaquin 500 mg p.o. daily for seven days.    DISCHARGE DIET: 1800 ADA.   DISCHARGE ACTIVITY: As tolerated.   DISCHARGE INSTRUCTIONS AND FOLLOW-UP: The patient was instructed to follow up with his primary care physician, Dr. Duncan Dulleresa Tullo, in 1 to 2 weeks. He was given instructions to see Dr. Clydie Braunavid Fitzgerald from infectious disease if needed in 2 to 4 weeks. He was also counseled on diabetic education and diabetic diet for better control of his blood sugar, as he was  noncompliant on his diabetic aspect, and medically also he was noncompliant.   TOTAL TIME DISCHARGING THIS PATIENT: 55 minutes.   ____________________________ Ellamae SiaVipul S. Sherryll BurgerShah, MD vss:cg D: 02/20/2014 03:51:35 ET T: 02/20/2014 04:31:41 ET JOB#: [id number]  cc: Ayo Guarino S. Sherryll BurgerShah, MD, <Dictator> Duncan Dulleresa Tullo, MD Stann Mainlandavid P. Sampson GoonFitzgerald, MD Ellamae SiaVIPUL S [facility] MD ELECTRONICALLY SIGNED 02/26/2014 11:46

## 2015-01-25 NOTE — Discharge Summary (Signed)
Dates of Admission and Diagnosis:  Date of Admission 27-Mar-2014   Date of Discharge 28-Mar-2014   Admitting Diagnosis Confusion   Final Diagnosis 1. Encephalopathy due to medications 2. Chronic pain syndrome    Chief Complaint/History of Present Illness CHIEF COMPLAINT: Altered mental status.   HISTORY OF PRESENT ILLNESS: Patrick [name] is a 74 year old with multiple medical problems including coronary artery disease status post coronary artery bypass grafting, peripheral vascular disease status post left BKA, continued tobacco use, COPD, diabetes mellitus. He is brought to the Emergency Department with complaints of confusion. The patient has been increasingly sleepy for the last 2 weeks. Has been falling down. The patient???s sleepiness and confusion has been gradually getting worse. This morning, the patient fell down from the commode. Concerning about the patient's increased sleepiness, stopped trazodone and other sleeping pill; however, the patient continued to have worsening of the sleepiness. This history is mainly obtained from the patient's daughter. The patient's daughter usually takes care of all other medications, however, pain pills have been taken care of by the patient. The patient continues to smoke. Workup in the Emergency Department did not find any signs of any infection. CT head without contrast showed 2 lesions, which are slightly bigger compared to the previous scan, in the suprasellar area. Daughter states that did not have any episodes of seizures. Has not been having any cough. The patient???s pCO2 in the Emergency Department is 42; however, has pH of 7.30.   Allergies:  Amoxicillin: N/V/Diarrhea  Cardiology:  24-Jun-15 18:25   Ventricular Rate 66  Atrial Rate 66  P-R Interval 142  QRS Duration 120  QT 484  QTc 507  P Axis 50  R Axis -52  T Axis 107  ECG interpretation Normal sinus rhythm Possible Left atrial enlargement Left axis deviation Right bundle branch  block Inferior infarct , age undetermined Anterior infarct , age undetermined T wave abnormality, consider lateral ischemia Abnormal ECG When compared with ECG of 12-Dec-2012 16:17, Right bundle branch block has replaced Left bundle branch block Anterior infarct is now Present Inferior infarct is now Present ----------unconfirmed---------- Confirmed by OVERREAD, NOT ([id number]), editor PEARSON, BARBARA ([id number]) on 03/28/2014 3:00:57 PM  Routine Chem:  24-Jun-15 18:26   Glucose, Serum  141  BUN  23  Creatinine (comp) 1.12  Sodium, Serum 139  Potassium, Serum 3.7  Chloride, Serum 102  CO2, Serum 31  Calcium (Total), Serum  8.4  Anion Gap  6  Osmolality (calc) 284  eGFR (African American) >60  eGFR (Non-African American) >60 (eGFR values <29m/min/1.73 m2 may be an indication of chronic kidney disease (CKD). Calculated eGFR is useful in patients with stable renal function. The eGFR calculation will not be reliable in acutely ill patients when serum creatinine is changing rapidly. It is not useful in  patients on dialysis. The eGFR calculation may not be applicable to patients at the low and high extremes of body sizes, pregnant women, and vegetarians.)  25-Jun-15 03:41   Glucose, Serum 98  BUN  28  Creatinine (comp) 1.26  Sodium, Serum 141  Potassium, Serum 3.7  Chloride, Serum 104  CO2, Serum  33  Calcium (Total), Serum  8.4  Anion Gap  4  Osmolality (calc) 287  eGFR (African American) >60  eGFR (Non-African American)  57 (eGFR values <640mmin/1.73 m2 may be an indication of chronic kidney disease (CKD). Calculated eGFR is useful in patients with stable renal function. The eGFR calculation will not be reliable in acutely ill patients when serum  creatinine is changing rapidly. It is not useful in  patients on dialysis. The eGFR calculation may not be applicable to patients at the low and high extremes of body sizes, pregnant women, and vegetarians.)  Cardiac:  24-Jun-15 18:26    Troponin I 0.04 (0.00-0.05 0.05 ng/mL or less: NEGATIVE  Repeat testing in 3-6 hrs  if clinically indicated. >0.05 ng/mL: POTENTIAL  MYOCARDIAL INJURY. Repeat  testing in 3-6 hrs if  clinically indicated. NOTE: An increase or decrease  of 30% or more on serial  testing suggests a  clinically important change)    18:30   CPK-MB, Serum  11.0 (Result(s) reported on 27 Mar 2014 at 11:59PM.)    23:33   CPK-MB, Serum  7.4 (Result(s) reported on 28 Mar 2014 at 12:21AM.)  Troponin I 0.03 (0.00-0.05 0.05 ng/mL or less: NEGATIVE  Repeat testing in 3-6 hrs  if clinically indicated. >0.05 ng/mL: POTENTIAL  MYOCARDIAL INJURY. Repeat  testing in 3-6 hrs if  clinically indicated. NOTE: An increase or decrease  of 30% or more on serial  testing suggests a  clinically important change)  25-Jun-15 03:41   CPK-MB, Serum  6.7 (Result(s) reported on 28 Mar 2014 at 04:17AM.)  Troponin I 0.03 (0.00-0.05 0.05 ng/mL or less: NEGATIVE  Repeat testing in 3-6 hrs  if clinically indicated. >0.05 ng/mL: POTENTIAL  MYOCARDIAL INJURY. Repeat  testing in 3-6 hrs if  clinically indicated. NOTE: An increase or decrease  of 30% or more on serial  testing suggests a  clinically important change)  Routine UA:  24-Jun-15 19:50   Color (UA) Yellow  Clarity (UA) Clear  Glucose (UA) Negative  Bilirubin (UA) Negative  Ketones (UA) Negative  Specific Gravity (UA) 1.008  Blood (UA) 2+  pH (UA) 5.0  Protein (UA) 100 mg/dL  Nitrite (UA) Negative  Leukocyte Esterase (UA) Negative (Result(s) reported on 27 Mar 2014 at 08:03PM.)  RBC (UA) 31 /HPF  WBC (UA) 1 /HPF  Bacteria (UA) NONE SEEN  Epithelial Cells (UA) 1 /HPF  Hyaline Cast (UA) 18 /LPF (Result(s) reported on 27 Mar 2014 at 08:03PM.)  Routine Hem:  24-Jun-15 18:26   WBC (CBC) 5.2  RBC (CBC)  4.26  Hemoglobin (CBC)  10.8  Hematocrit (CBC)  35.4  Platelet Count (CBC) 237  MCV 83  MCH  25.5  MCHC  30.6  RDW  16.9  Neutrophil % 74.2   Lymphocyte % 16.6  Monocyte % 6.6  Eosinophil % 1.8  Basophil % 0.8  Neutrophil # 3.8  Lymphocyte #  0.9  Monocyte # 0.3  Eosinophil # 0.1  Basophil # 0.0 (Result(s) reported on 27 Mar 2014 at 06:55PM.)  25-Jun-15 03:41   WBC (CBC) 5.7  RBC (CBC)  3.98  Hemoglobin (CBC)  10.3  Hematocrit (CBC)  33.2  Platelet Count (CBC) 227  MCV 83  MCH  25.9  MCHC  31.1  RDW  17.1  Neutrophil % 61.2  Lymphocyte % 23.6  Monocyte % 10.2  Eosinophil % 4.1  Basophil % 0.9  Neutrophil # 3.5  Lymphocyte # 1.3  Monocyte # 0.6  Eosinophil # 0.2  Basophil # 0.1 (Result(s) reported on 28 Mar 2014 at 04:23AM.)   PERTINENT RADIOLOGY STUDIES: XRay:    24-Jun-15 19:40, Chest 1 View AP or PA  Chest 1 View AP or PA   REASON FOR EXAM:    altered mental status  COMMENTS:       PROCEDURE: DXR - DXR CHEST 1 VIEWAP OR PA  -  Mar 27 2014  7:40PM     CLINICAL DATA:  Shortness of breath.  Altered mental status.    EXAM:  CHEST - 1 VIEW    COMPARISON:  02/18/2014.    FINDINGS:  The heart is enlarged but stable. There is vascular congestion and  mild interstitial edema suggesting CHF. A right pleural effusion  persists. No infiltrates or pneumothorax.     IMPRESSION:  CHF and right pleural effusion.      Electronically Signed    By: Kalman Jewels M.D.    On: 03/27/2014 19:47         Verified By: Marlane Hatcher, M.D.,  [place]:    24-Jun-15 19:39, CT Cervical Spine Without Contrast  PACS Image     24-Jun-15 19:39, CT Head Without Contrast  PACS Image     24-Jun-15 19:40, Chest 1 View AP or PA  PACS Image   CT:    24-Jun-15 19:39, CT Cervical Spine Without Contrast  CT Cervical Spine Without Contrast   REASON FOR EXAM:    fall unable to clear cspinre clinically  COMMENTS:       PROCEDURE: CT  - CT CERVICAL SPINE WO  - Mar 27 2014  7:39PM     CLINICAL DATA:  Fall, altered mental status    EXAM:  CT HEAD WITHOUT CONTRAST    CT CERVICAL SPINE WITHOUT  CONTRAST    TECHNIQUE:  Multidetector CT imaging of the head and cervical spine was  performed following the standard protocol without intravenous  contrast. Multiplanar CT image reconstructions of the cervical spine  were also generated.    COMPARISON:  11/27/2012    FINDINGS:  CT HEAD FINDINGS    No skull fracture is noted. No intracranial hemorrhage, mass effect  or midline shift. Stable mild cerebral atrophy. Stable  periventricular chronic white matter disease. Atherosclerotic  calcificationsare again noted carotid siphon. Again noted  hyperdense nodular lesion in suprasellar region midline measures 9  by 9 mm on the prior exam measures 8 x 8 mm.  There is no surrounding vasogenic edema or mass effect.    No acute cortical infarction.    CT CERVICAL SPINE FINDINGS    Axial images of the cervical spine shows no acute fracture or  subluxation. Computer processed images shows significant disc space  flattening endplate sclerotic changes endplate irregularity and mild  anterior and mild posterior spurring at C4-C5 and C5-C6 level. There  is disc space flattening with mild anterior spurring at C6-C7 level.  No prevertebral soft tissue swelling.    Coronal images shows bilateral carotid calcifications. No  prevertebral soft tissue swelling. Cervical airway is patent. Mild  degenerative changes C1-C2 articulation.     IMPRESSION:  1. No acute intracranial abnormality. Again noted hyperdense nodular  lesion in suprasellar region measures 9 x 9 mm. On the prior exam  measures 8 x 8 mm. Clinical correlation is necessary. If clinically  warranted further correlation with MRI could be performed. Stable  atrophy and chronic white matter disease.  2. No cervical spine acute fracture or subluxation. Degenerative  changes as described above.      Electronically Signed    By: Lahoma Crocker M.D.    On: 03/27/2014 19:47     Verified By: Ephraim Hamburger, M.D.,    24-Jun-15 19:39, CT  Head Without Contrast  CT Head Without Contrast   REASON FOR EXAM:    fall altered mental status  COMMENTS:  PROCEDURE: CT  - CT HEAD WITHOUT CONTRAST  - Mar 27 2014  7:39PM     CLINICAL DATA:  Fall, altered mental status    EXAM:  CT HEAD WITHOUT CONTRAST    CT CERVICAL SPINE WITHOUT CONTRAST    TECHNIQUE:  Multidetector CT imaging of the head and cervical spine was  performed following the standard protocol without intravenous  contrast. Multiplanar CT image reconstructions of the cervical spine  were also generated.    COMPARISON:  11/27/2012    FINDINGS:  CT HEAD FINDINGS    No skull fracture is noted. No intracranial hemorrhage, mass effect  or midline shift. Stable mild cerebral atrophy. Stable  periventricular chronic white matter disease. Atherosclerotic  calcifications are again noted carotid siphon. Again noted  hyperdense nodular lesion in suprasellar region midline measures 9  by 9 mm on the prior exam measures 8 x 8 mm.  There is no surrounding vasogenic edema or mass effect.    No acute cortical infarction.    CT CERVICAL SPINE FINDINGS    Axial images of the cervical spine shows no acute fracture or  subluxation. Computer processed images shows significant disc space  flattening endplate sclerotic changes endplate irregularity and mild  anterior and mild posteriorspurring at C4-C5 and C5-C6 level. There  is disc space flattening with mild anterior spurring at C6-C7 level.  No prevertebral soft tissue swelling.    Coronal images shows bilateral carotid calcifications. No  prevertebral soft tissue swelling. Cervical airway is patent. Mild  degenerative changes C1-C2 articulation.     IMPRESSION:  1. No acute intracranial abnormality. Again noted hyperdense nodular  lesion in suprasellar region measures 9 x 9 mm. On the prior exam  measures 8 x 8 mm. Clinical correlation is necessary. If clinically  warranted further correlation with MRI could be  performed. Stable  atrophy and chronic white matter disease.  2. No cervical spine acute fracture or subluxation. Degenerative  changes as described above.      Electronically Signed    By: Lahoma Crocker M.D.    On: 03/27/2014 19:47     Verified By: Ephraim Hamburger, M.D.,   Pertinent Past History:  Pertinent Past History PAST MEDICAL HISTORY:  1. Hypertension.  2. Hyperlipidemia.  3. Congestive heart failure with EF of 45%.  4. Diabetes mellitus, insulin dependent.  5. Peripheral vascular disease, status post left BKA.  6. Peripheral neuropathy.  7. COPD.   8. Continued tobacco use.  9. Coronary artery disease, status post coronary artery bypass grafting.  10. History of CVA.   Hospital Course:  Hospital Course Admitted for confusion and lethargy from hme. Patient was on high dose narcotics, Trazadone and remeron. Medication were stopped intitially and patient returned to his baseline. He laso seems to have early dementia. Pt did not wnt his pain medication decreased in dosage but after discussing at length regarding the side effects he has agreed to change the doses. I have discussed with daughter regarding discarding his old meds and taking medications only as prescibed. There was also concern as to patint abusing his pain meds. There are no signs or symptoms of acute infection or inflammation. Ct head shwed a stable supracellar nodules compared to prior CT head.  Time spent on discharge45 minutes   Condition on Discharge Fair   Code Status:  Code Status Full Code   PHYSICAL EXAM ON DISCHARGE:  Physical Exam:  GEN no acute distress, thin   HEENT PERRL, hearing intact to  voice   NECK supple  No masses   RESP normal resp effort  clear BS   CARD regular rate   PSYCH alert, A+O to time, place, person, good insight   Additional Comments Left BKA   VITAL SIGNS:  Vital Signs: **Vital Signs.:   25-Jun-15 08:20  Vital Signs Type Pre Medication  Pulse Pulse 52   Systolic BP Systolic BP 335  Diastolic BP (mmHg) Diastolic BP (mmHg) 43  Mean BP 63  Pulse Ox % Pulse Ox % 95   DISCHARGE INSTRUCTIONS HOME MEDS:  Medication Reconciliation: Patient's Home Medications at Discharge:     Medication Instructions  aspirin 81 mg oral tablet  1 tab(s) orally once a day   spiriva 18 mcg inhalation capsule  1 each inhaled once a day   docusate sodium 100 mg oral capsule  1 cap(s) orally 2 times a day   ferrous sulfate 325 mg (65 mg elemental iron) oral tablet  1 tab(s) orally 2 times a day   lantus 100 units/ml subcutaneous solution  25 unit(s) subcutaneous once a day (at bedtime)   simvastatin 40 mg oral tablet  1 tab(s) orally once a day (at bedtime)   trazodone 50 mg oral tablet  0.5 tab(s) orally once a day   carvedilol 12.5 mg oral tablet  0.5 tab(s) orally 2 times a day   oxycodone 15 mg oral tablet, extended release  1 tab(s) orally every 12 hours   oxycodone 5 mg oral tablet  1 tab(s) orally 4 times a day, As Needed - for Pain    STOP TAKING THE FOLLOWING MEDICATION(S):    remeron 15 mg oral tablet: 1 tab(s) orally once a day (at bedtime)  Physician's Instructions:  Home Health? Yes   [place] as before   Diet Carbohydrate Controlled (ADA) Diet   Activity Limitations As tolerated   Return to Work Not Applicable   Time frame for Follow Up Appointment 1-2 weeks  PCP   Other Comments Take medications only as prescribed.   Electronic Signatures: Alba Destine (MD)  (Signed 28-Jun-15 22:18)  Authored: ADMISSION DATE AND DIAGNOSIS, CHIEF COMPLAINT/HPI, Allergies, PERTINENT LABS, PERTINENT RADIOLOGY STUDIES, PERTINENT PAST HISTORY, HOSPITAL COURSE, PHYSICAL EXAM ON DISCHARGE, VITAL SIGNS, DISCHARGE INSTRUCTIONS HOME MEDS, PATIENT INSTRUCTIONS   Last Updated: 28-Jun-15 22:18 by Alba Destine (MD)

## 2015-01-25 NOTE — H&P (Signed)
PATIENT NAME:  Patrick [name], Patrick [name] MR#:  [id number] DATE OF BIRTH:  1941/04/12  DATE OF ADMISSION:  03/27/2014  PRIMARY CARE PHYSICIAN: Dr. Duncan Dull.    REFERRING PHYSICIAN: Dr. Ethelda Chick.    CHIEF COMPLAINT: Altered mental status.   HISTORY OF PRESENT ILLNESS: Patrick [name] is a 74 year old with multiple medical problems including coronary artery disease status post coronary artery bypass grafting, peripheral vascular disease status post left BKA, continued tobacco use, COPD, diabetes mellitus. He is brought to the Emergency Department with complaints of confusion. The patient has been increasingly sleepy for the last 2 weeks. Has been falling down. The patient's sleepiness and confusion has been gradually getting worse. This morning, the patient fell down from the commode. Concerning about the patient's increased sleepiness, stopped trazodone and other sleeping pill; however, the patient continued to have worsening of the sleepiness. This history is mainly obtained from the patient's daughter. The patient's daughter usually takes care of all other medications, however, pain pills have been taken care of by the patient. The patient continues to smoke. Workup in the Emergency Department did not find any signs of any infection. CT head without contrast showed 2 lesions, which are slightly bigger compared to the previous scan, in the suprasellar area. Daughter states that did not have any episodes of seizures. Has not been having any cough. The patient's pCO2 in the Emergency Department is 42; however, has pH of 7.30.   PAST MEDICAL HISTORY:  1. Hypertension.  2. Hyperlipidemia.  3. Congestive heart failure with EF of 45%.  4. Diabetes mellitus, insulin dependent.  5. Peripheral vascular disease, status post left BKA.  6. Peripheral neuropathy.  7. COPD.   8. Continued tobacco use.  9. Coronary artery disease, status post coronary artery bypass grafting.  10. History of CVA.  11. Depression and  anxiety.   PAST SURGICAL HISTORY:  1. Coronary artery bypass grafting.  2. Cholecystectomy.   ALLERGIES: AMPICILLIN.   HOME MEDICATIONS:  1. Tylenol 650 mg 3 times a day.  2. Trazodone 25 mg once a day.  3. Spiriva  mcg once a day.  4. Simvastatin 40 mg once a day.  5. Remeron 15 mg once a day.  6. OxyContin 60 mg 2 times a day.  7. Oxycodone 10 mg every 4 hours as needed.  8. Lantus 25 units at bedtime.  9. Ferrous sulfate 325 mg 2 times a day.  10. Docusate sodium 100 mg 2 times a day.  11. Coreg 12.5 mg 2 times a day.  12. Aspirin 81 mg daily.   SOCIAL HISTORY: The patient continues to smoke 1 pack a day. No history of alcohol or drug use. Lives with his daughter.   FAMILY HISTORY: Father died at the age of 57 from MI.    REVIEW OF SYSTEMS: Could not be obtained as the patient is a quite somnolent.   PHYSICAL EXAMINATION:  GENERAL: This is a well-built, well-nourished, age-appropriate male lying down in the bed, not in distress, quite somnolent.  VITAL SIGNS: Temperature 97.8, pulse 58, blood pressure 133/58, respiratory rate of 16, oxygen saturation is 98% on 2 liters of oxygen.  HEENT: Head normocephalic, atraumatic. There is no scleral icterus. Conjunctivae normal. Pupils equal and react to light. Extraocular movements: Could not examine. Mucous membranes: Mild dryness. No pharyngeal erythema.  NECK: Supple. No lymphadenopathy. No JVD. No carotid bruit. No thyromegaly.  CHEST: Has no focal tenderness.  LUNGS: Bilaterally clear to auscultation.  HEART: S1 and S2 regular. No  murmurs are heard.  ABDOMEN: Bowel sounds present. Soft, nontender, nondistended. Could not appreciate any hepatosplenomegaly.  EXTREMITIES: Left BKA. Right lower extremity 1+ pitting edema, chronic venous stasis changes, as well as changes consistent with peripheral vascular disease.   NEUROLOGIC: The patient is oriented to self but quickly falling asleep. Could not examine the orientation to place,  person and time.   LABORATORIES: CBC: WBC of 5.2, hemoglobin 10.8, platelet count of 237. CMP: BUN 23, creatinine of 1.12. The rest of all of the values are within normal limits. Troponin 0.04. CK-MB 11. CT head without contrast: No acute intracranial abnormalities. CT cervical spine without contrast shows a 9.9 mm and 8.8 mm hypodense nodular lesions in the suprasellar region. Chest x-ray, 1 view portable: CHF and right pleural effusion.   UA negative for nitrites and leukocyte esterase.   ASSESSMENT AND PLAN: Patrick [name] is a 74 year old male who comes to the Emergency Department with altered mental status with increased somnolence.  1. Altered mental status: Very highly concerning about medication. Unsure if the patient has been taking the medications more than usual as the patient has been quite somnolent and confused. Hold all sedative medications for now. Continue with intravenous fluids as the patient has BUN and creatinine ratio greater than 20. Will obtain MRI of the brain concerning about 2 nodules. No obvious signs of infection are noted.  2. Dehydration: Continue with intravenous fluids.  3. History of hypertension: Will continue home medications.  4. History of coronary artery disease and peripheral vascular disease: Continue with aspirin and statin.  5. Keep the patient on deep vein thrombosis prophylaxis with Lovenox.   TIME SPENT: 55 minutes.   ____________________________ Susa GriffinsPadmaja Vasireddy, MD pv:gb D: 03/28/2014 00:12:25 ET T: 03/28/2014 03:48:18 ET JOB#: [id number]  cc: Susa GriffinsPadmaja Vasireddy, MD, <Dictator> Duncan Dulleresa Tullo, MD Susa GriffinsPADMAJA VASIREDDY MD ELECTRONICALLY SIGNED 03/28/2014 21:01

## 2015-01-25 NOTE — H&P (Signed)
PATIENT NAME:  Patrick [name], Patrick [name] MR#:  [id number] DATE OF BIRTH:  05-01-1941  DATE OF ADMISSION:  02/18/2014  PRIMARY DOCTOR: Dr. Darrick Huntsman   ER PHYSICIAN: Dr. Scotty Court  CHIEF COMPLAINT: Leg cellulitis.  HISTORY OF PRESENT ILLNESS:  A 73 year old male with hypertension, diabetes, CKD stage III, and chronic systolic heart failure, comes in because of right leg cellulitis, going on for about 2 weeks. The patient has no fever, but has pain in the right leg. The patient's daughter  wanted him to come and check his leg cellulitis, so he came to the ER. He never wanted to come and never wanted to be admitted, but because of multiple risk factors, patient decided that he is going to stay. We are admitting him for leg cellulitis. The patient denies any trouble breathing. No chest pain. No cough. No fever. No loss of appetite. The patient denies any insect bite or trauma to the right leg. Having this right leg redness, swelling, and pain for about 4 weeks. He did not take any antibiotics.   PAST MEDICAL HISTORY: Significant for history of hypertension, CHF, chronic systolic heart failure, EF 45%. Diabetes mellitus type 2. Hypertension. Hyperlipidemia. History of stroke and peripheral neuropathy.   ALLERGIES:  AMPICILLIN.    SOCIAL HISTORY: Smokes about 1 pack a day. No drinking. No drugs. The patient does not want to quit. Lives alone.   FAMILY HISTORY: Father died at the age of 65 due to MI. .    SURGICAL HISTORY: Significant for bypass surgery and cholecystectomy.   MEDICATIONS: The patient does not remember the medications, but daughter went out and will be back. We are going to get the list from his daughter, but according to the discharge summary that was done in 2013, at that time he was on simvastatin, ferrous sulfate, Celexa, Spiriva, Coreg, Lantus, and Lasix.   REVIEW OF SYSTEMS: CONSTITUTIONAL: No fever. No fatigue.  EYES: No blurred vision.   ENT: No tinnitus. No epistaxis. No difficulty  swallowing. RESPIRATORY: Has some cough. No wheezing. No trouble breathing.  CARDIOVASCULAR: No chest pain. No orthopnea. No PND.  GASTROINTESTINAL: No nausea. No vomiting. No abdominal pain.  GENITOURINARY: No dysuria.  ENDOCRINE: No polyuria or nocturia.  HEMATOLOGIC: No anemia or easy bruising.  MUSCULOSKELETAL: The patient has right leg cellulitis for 4 weeks, with some difficulty in ambulation due to pain and swelling.  NEUROLOGIC: No numbness or weakness,no h/o seizures or no  history of stroke. PSYCHIATRIC: No anxiety or insomnia.   PHYSICAL EXAMINATION: VITAL SIGNS: Temperature 98.1, heart rate 63, blood pressure is 142/66, sats 98% on room air.  GENERAL: Alert, awake, oriented, elderly male, not in distress  HEAD: Atraumatic, normocephalic.  EYES: Pupils equal, reacting to light. Extraocular movements intact  e hypertrophy,no Orpharyngeal erythema, ENT;external auditory canals are normal.no turbinat NECK: Supple. No JVD. No carotid bruit.  CARDIOVASCULAR: S1, S2 regular. No murmurs. The patient's pulses are equal at carotids. Dorsalis pedis is not really felt on the right side due to edema. The patient also has a left below- knee amputation.  RESPIRATORY:  Bilateral breath sounds present. No wheeze, no rales.  GASTROINTESTINAL: Abdomen is soft, nontender ,ND,BS present. no hernias.  MUSCULOSKELETAL: The patient has a left below-knee amputation.   SKIN: The patient has redness and swelling of the right leg, with associated pitting edema and tenderness to palpation all the way extending from below the knee to dorsum of the right foot. The patient also noticed to have some open areas of  blisters on the right leg.  NEUROLOGIC:  Alert, awake, oriented. Cranial nerves II to XII intact .power 5/5 bilateraly  upper and lower extremities.,DTR 2+ bilaterally. PSYCHIATRIC: Mood and affect are within normal limits.   LAB DATA: Electrolytes: Sodium 141, potassium 3.9, chloride 102, bicarb 38,  BUN 28, creatinine 1.5, glucose 136. WBC 5.6, hemoglobin 12.4, hematocrit 40.5, platelets 318. The patient's x-ray of the tibia, fibula on the right side did not show any acute osteomyelitis. Chest x-ray shows CHF, no air space consolidation. EKG was not done in the ER.  ASSESSMENT AND PLAN:  821.  A 74 year old male patient with right leg cellulitis. Because of multiple risk factors of hypertension, diabetes, peripheral vascular disease, and advanced age, will admit him to hospitalist service, start him on IV Levaquin and vancomycin. The patient is allergic to PENICILLINS, so continue vancomycin, Levaquin, follow blood cultures, and follow clinical improvement.   2. Chronic systolic heart failure.  continue his Lasix and Coreg, and see how he does.   3.  History of diabetes mellitus type II. The patient is supposed to be on Lantus. Will get the home medication list and restart them, but continue sliding scale with coverage.   4.  History of chronic obstructive pulmonary disease, with continued tobacco abuse. Counseled against smoking for 3 minutes. The patient is on Spiriva, continue that.   5.  Chronic kidney disease stage III. Creatinine is almost stable. The patient's renal function is more or less stable.  6.  Gastrointestinal prophylaxis and deep vein thrombosis prophylaxis.   Time spent on history and physical is 60 minutes.    ____________________________ Katha HammingSnehalatha Sareen Randon, MD sk:mr D: 02/18/2014 18:50:13 ET T: 02/18/2014 19:09:41 ET JOB#: [id number]  cc: Katha HammingSnehalatha Paizley Ramella, MD, <Dictator> Katha HammingSNEHALATHA Shilpa Bushee MD ELECTRONICALLY SIGNED 02/27/2014 20:55

## 2015-01-26 NOTE — H&P (Signed)
PATIENT NAME:  Patrick [name], Patrick [name] MR#:  [id number] DATE OF BIRTH:  1940/11/08  DATE OF ADMISSION:  11/22/2011  PRIMARY CARE PHYSICIAN: Dr. Darrick Huntsman   CARDIOLOGIST: Dr. [name]   CHIEF COMPLAINT: Shortness of breath and weakness.   HISTORY OF PRESENT ILLNESS: This is a 74 year old man who presents with a few weeks of swelling of his ankles, shortness of breath, cough which has been nonproductive, weakness, some discomfort. He thought it was initially the flu. He has been using his inhaler more. In the ER they were considering discharge home but when they walked him around his pulse oximetry dropped down into the 80's with ambulation and then hospitalist services were contacted for further evaluation.   PAST MEDICAL HISTORY:  1. Congestive heart failure. 2. Diabetes. 3. Hypertension. 4. Hyperlipidemia. 5. Gastroesophageal reflux disease. 6. Chronic back pain. 7. Neuropathy. 8. CVA. 9. Coronary artery disease.   PAST SURGICAL HISTORY:  1. CABG. 2. Cholecystectomy.   ALLERGIES: Penicillin.   MEDICATIONS:  1. Percocet 5/325 one tablet every six hours p.r.n.  2. Lasix 40 mg daily but recently he doubled up on his fluid pill to 80 mg daily for the past couple of weeks but did not take any today. 3. Metoprolol 12.5 mg twice a day.  4. Lisinopril 2.5 mg daily, recently started last month. 5. Ferrous sulfate 325 mg daily.  6. Lantus 40 units in the a.m. subcutaneous injection. 7. NovoLog 20 units q.a.c.  8. OxyContin 60 mg b.i.d.  9. Gabapentin 300 mg t.i.d.  10. Albuterol inhaler p.r.n.  11. Nexium 40 mg daily.  12. Celexa 20 mg daily.  13. Zocor 40 mg daily.   SOCIAL HISTORY: Smokes 1 pack per day. No alcohol. No drug use. Worked as a Nutritional therapist. Lives alone.   FAMILY HISTORY: Father died at 57 of an MI. Mother died at 18 of a CVA.   REVIEW OF SYSTEMS: CONSTITUTIONAL: Positive for chills. Positive for weak, being stable. No fever. No sweats. Positive for fatigue. EYES: He does wear glasses  and has scratchy eyes. EARS, NOSE, MOUTH, AND THROAT: Decreased hearing. Positive for runny nose. No sore throat. No difficulty swallowing. CARDIOVASCULAR: No chest pain. No palpitations. RESPIRATORY: Positive for shortness breath. Positive for cough. No sputum. No hemoptysis. GASTROINTESTINAL: Positive for nausea and vomiting last week. Occasional abdominal pain. No diarrhea. No constipation. No bright red blood per rectum. No melena. GENITOURINARY: No burning on urination. No hematuria. MUSCULOSKELETAL: Positive for back pain and joint pain. INTEGUMENTARY: No rashes or eruptions. NEUROLOGIC: No fainting or blackouts. PSYCHIATRIC: Positive for anxiety. ENDOCRINE: No thyroid problems. HEMATOLOGIC/LYMPHATIC: No anemia. No easy bruising or bleeding.   PHYSICAL EXAMINATION:   CURRENT VITAL SIGNS: Temperature 98.1, pulse 71, respirations 18, blood pressure 118/58, pulse oximetry 99%.   GENERAL: No respiratory distress. Audible noises in the lungs heard even without stethoscope on the chest.   EYES: Conjunctivae and lids normal. Pupils equal, round, and reactive to light. Extraocular muscles intact. No nystagmus.   EARS, NOSE, MOUTH, AND THROAT: Tympanic membranes no erythema. Nasal mucosa no erythema. Throat no erythema. No exudate seen. Lips and gums normal.   NECK: No JVD. No bruits. No lymphadenopathy. No thyromegaly. No thyroid nodules palpated.   RESPIRATORY: Decreased breath sounds bilaterally. Positive expiratory wheeze. No rales or rhonchi heard. Coarse breath sounds bilaterally. No use of accessory muscles to breathe.   CARDIOVASCULAR: S1, S2 normal. No gallops, rubs, or murmurs heard. Carotid upstroke 2+ bilaterally. No bruits. Dorsalis pedis pulses 2+ bilaterally. Trace edema  of the lower extremity.   ABDOMEN: Soft, nontender. No organomegaly/splenomegaly. Normoactive bowel sounds. No masses felt.   LYMPHATIC: No lymph nodes in the neck.   MUSCULOSKELETAL: Trace edema. No cyanosis at  rest.   SKIN: On the left ankle the patient stated that he scraped an area. It does look like it is healing but if it does not heal he should see a dermatologist as outpatient.   NEUROLOGIC: Cranial nerves II through XII grossly intact. Deep tendon reflexes 2+ bilateral lower extremities.   PSYCHIATRIC: The patient is oriented to person, place, and time.   LABORATORY AND RADIOLOGICAL DATA: Troponin negative. White blood cell count 7.0, hemoglobin 9.48, hematocrit 29.5, platelet count 316, glucose 206, BUN 35, creatinine 1.75, sodium 138, potassium 4.1, chloride 105, CO2 27, calcium 8.5. Liver function tests normal range. GFR 41. Chest x-ray pulmonary vascular congestion, interstitial edema. EKG left bundle branch block, left axis.      ASSESSMENT AND PLAN:  1. Acute respiratory failure with pulse oximetry on room air being normal but with exercise dropping down into the 80's. Will give oxygen supplementation. Check room air pulse oximetry in a.m. after ambulation after treatment today.  2. COPD exacerbation. Will give Solu-Medrol 125 mg IV x1 and 60 mg q.6 hours DuoNeb nebulizer solution. Add Spiriva and give a Z-Pak orally.  3. Congestive heart failure, acute on chronic. Careful diuresis with acute renal failure. Will give Lasix 40 mg IV daily. Hold ACE inhibitor at this time with acute renal failure. Continue metoprolol. Check an echocardiogram.  4. Acute renal failure. Watch closely with diuresis. May be with the lisinopril recently started or doubling up on his Lasix pills at home. Will get a renal sonogram and watch creatinine while here.  5. History of coronary artery disease, left bundle branch block seen on EKG. No prior to compare but will check cardiac enzymes and put on telemetry.  6. Tobacco abuse. Nicotine patch applied by me. Smoking cessation counseling done, three minutes by me.  7. Gastroesophageal reflux disease. Continue Nexium.  8. Hypertension, blood pressure borderline.  Continue metoprolol and titrate if needed.  9. Diabetes. Continue Lantus and will give NovoLog sliding scale.  10. Back pain, chronic, with neuropathy. Continue gabapentin, OxyContin, and oxycodone.  11. Anemia. Will send off iron studies and guaiac stools.  12. Hyperlipidemia. Continue Zocor.  13. Anxiety. Continue Celexa.   TIME SPENT ON ADMISSION: 55 minutes.   CODE STATUS: The patient is a FULL CODE.   ____________________________ Herschell Dimesichard J. Renae GlossWieting, MD rjw:drc D: 11/22/2011 18:47:57 ET T: 11/23/2011 05:48:09 ET JOB#: [id number]  cc: Herschell Dimesichard J. Renae GlossWieting, MD, <Dictator> Duncan Dulleresa Tullo, MD Antonieta Ibaimothy J. Gollan, MD Salley ScarletICHARD J Marisol Glazer MD ELECTRONICALLY SIGNED 11/23/2011 15:26

## 2015-01-26 NOTE — Consult Note (Signed)
General Aspect 74 year old male with a history of poorly controlled diabetes,  coronary artery disease, MI in 2000 with bypass at that time, followup cardiac catheterization in 2010 (?)  that was reportedly "okay" performed at [facility], diabetes, peripheral vascular disease with a history of intervention to his legs in June of 2011, long history of smoking who continues to smoke one pack per day, history of GI bleeding in 2011 requiring packed red blood cell transfusion x4 per the patient, last seen over one year ago, presenting with worsening shortness of breath over the past several weeks. Cardiology was consulted for systolic CHF.   he reports that he has had little followup with his physicians. He does not take a diuretic on a regular basis. He does have fast food and sodas. He has had worsening shortness of breath and leg edema over the past 3 weeks. Today, he reports that his shortness of breath is moderately improved. He has received IV Lasix on arrival and again today with gentle diuresis. He did have a scant cough though this is chronic. No significant sputum production, no fevers or malaise. He sleeps on 2 pillows which is his usual habit.    he has not been on Plavix because of a history of GI bleeding and only takes low-dose aspirin. He continues to smoke.   Notes from March 09, 2010 details previous intervention to the bilateral common iliac arteries and external iliac artery with stent x2 to the left common iliac artery and external iliac artery.    Present Illness . SOCIAL HISTORY: Smokes 1 pack per day. No alcohol. No drug use. Worked as a Development worker, community. Lives alone.   FAMILY HISTORY: Father died at 28 of an MI. Mother died at 20 of a CVA.   Physical Exam:   GEN WD, WN, NAD, thin    HEENT red conjunctivae    NECK supple    RESP normal resp effort  decreased bs B/L    CARD Regular rate and rhythm    ABD denies tenderness  soft    LYMPH negative neck    EXTR negative edema     SKIN normal to palpation    NEURO cranial nerves intact, motor/sensory function intact    PSYCH alert, A+O to time, place, person, good insight   Review of Systems:   Subjective/Chief Complaint SOB, cough    General: Weakness    Skin: No Complaints    ENT: No Complaints    Eyes: No Complaints    Neck: No Complaints    Respiratory: Frequent cough  Short of breath    Cardiovascular: Dyspnea    Gastrointestinal: No Complaints    Genitourinary: No Complaints    Vascular: No Complaints    Musculoskeletal: No Complaints    Neurologic: No Complaints    Hematologic: No Complaints    Endocrine: No Complaints    Psychiatric: No Complaints    Review of Systems: All other systems were reviewed and found to be negative    Medications/Allergies Reviewed Medications/Allergies reviewed     myocardial Infarction:    diabetic:    hypertension:    gallbladder removed:    4 way bypass surgery:        Admit Diagnosis:   RESPIRATORY FAILURE: 23-Nov-2011, Active, RESPIRATORY FAILURE      Admit Reason:   Acute respiratory failure: (518.81) Active, ICD9, Acute respiratory failure  Home Medications: Medication Instructions Status  aspirin 81 mg oral tablet 1 tab(s) orally once a day  Active  Nexium 40 mg oral delayed release capsule 1 tab(s) orally once a day  Active  acetaminophen-oxycodone 325 mg-5 mg oral tablet 1 tab(s) orally every 6 hours as needed   Active  oxycodone 60 mg oral tablet, extended release 1 tab(s) orally 2 times a day as needed   Active  NovoLog 100 units/mL subcutaneous solution 1  subcutaneous 3 times a day  Active  Lantus 100 units/mL subcutaneous solution 1  subcutaneous once a day (at bedtime)  Active  gabapentin 300 mg oral capsule 1  orally 3 times a day  Active  acetaminophen 500 mg oral tablet 2  orally prn  Active  furosemide 40 mg oral tablet 2  orally prn  Active  Atrovent HFA 17 mcg/inh inhalation aerosol 1  inhaled prn  Active   OxyContin 60 mg oral tablet, extended release 1  orally prn  Active  simvastatin 40 mg oral tablet 1  orally once a day  Active  nitroglycerin 0.4 mg sublingual tablet 1  sublingual prn  Active  lisinopril 2.5 mg oral tablet 1 tab(s) orally once a day Active  ferrous sulfate 325 mg (65 mg elemental iron) oral tablet 1 tab(s) orally once a day Active  metoprolol tartrate 25 mg oral tablet 0.5 tab(s) orally 2 times a day Active  citalopram 20 mg oral tablet 1 tab(s) orally once a day Active     Blood Glucose:  19-Feb-13 03:23    POCT Blood Glucose 298  Cardiac:  19-Feb-13 06:21    Troponin I < 0.02  Routine Hem:  19-Feb-13 06:21    WBC (CBC) 6.2   RBC (CBC) 3.61   Hemoglobin (CBC) 9.4   Hematocrit (CBC) 29.4   Platelet Count (CBC) 287   MCV 81   MCH 26.0   MCHC 31.9   RDW 17.3  Routine Chem:  19-Feb-13 06:21    Glucose, Serum 265   BUN 38   Creatinine (comp) 1.57   Sodium, Serum 138   Potassium, Serum 4.8   Chloride, Serum 105   CO2, Serum 26   Calcium (Total), Serum 8.7   Osmolality (calc) 294   eGFR (African American) 57   eGFR (Non-African American) 47   Anion Gap 7  Routine Hem:  19-Feb-13 06:21    Neutrophil % 87.6   Lymphocyte % 11.4   Monocyte % 0.8   Eosinophil % 0.0   Basophil % 0.2   Neutrophil # 5.5   Lymphocyte # 0.7   Monocyte # 0.1   Eosinophil # 0.0   Basophil # 0.0  Routine Chem:  19-Feb-13 06:21    Cholesterol, Serum 117   Triglycerides, Serum 64   HDL ([facility]HOU[facility]) 29   VLDL Cholesterol Calculated 13   LDL Cholesterol Calculated 75   Ferritin ([facility]) 38   Iron Binding Capacity (TIBC) 338   Unbound Iron Binding Capacity 310   Iron, Serum 28   Iron Saturation 8  Routine Hem:  19-Feb-13 06:21    Retic Count 2.5   Absolute Retic Count 0.092  Routine Chem:  19-Feb-13 06:21    Hemoglobin A1c ([facility]) 10.2  Blood Glucose:  19-Feb-13 07:44    POCT Blood Glucose 241    11:37    POCT Blood Glucose 371  Lab:  19-Feb-13 14:05    O2  Saturation (Pulse Ox) 91   FiO2 (Pulse Ox) 0.21  Blood Glucose:  19-Feb-13 16:50    POCT Blood Glucose 470   EKG:   Interpretation EKG shows  NSR with rate of 63 bpm, IVCD, nonspecific ST ABN   Radiology Results: XRay:    18-Feb-13 13:46, Chest PA and Lateral   Chest PA and Lateral    REASON FOR EXAM:    SOB  COMMENTS:       PROCEDURE: DXR - DXR CHEST PA (OR AP) AND LATERAL  - Nov 22 2011  1:46PM     RESULT: Comparison is made to the prior exam of 06/11/2010. There is   prominence of the pulmonary vascularity compatible with pulmonary   vascular congestion. There is thickening of the interstitial lung   markings compatible with interstitial edema. The general appearance of   the chest is consistent with early CHF. The heart is mildly enlarged but   is stable in size as compared to the prior exam. Postoperative changes of   prior CABG are noted.    IMPRESSION:     There are changes of pulmonary vascular congestion and interstitial   edema. The findings are suspicious for early manifestation of congestive   heart failure.    Thank you for the opportunity to contribute to the care of your patient.           Verified By: Dionne Ano WALL, M.D., MD  Cardiology:    18-Feb-13 18:35, Echo Doppler   Echo Doppler    Interpretation Summary    The left ventricle is mildly dilated. Left ventricular systolic   function is severely reduced. Ejection Fraction = <25%. The   transmitral spectral Doppler flow pattern is suggestive of impaired   LV relaxation. There is moderate to severe global hypokinesis of the   left ventricle. The right ventricular systolic function is   moderately reduced. The left atrium is mildly dilated. There is mild   to moderate mitral regurgitation. Right ventricular systolic   pressure is elevated at 50-38mHg. Elevated RVSP consistent with   moderate pulmonary HTN.    PatientHeight: 178 cm    PatientWeight: 75 kg    BSA: 1.9 m2    Procedure:    The study  was completed  in the emergency room.    A two-dimensional transthoracic echocardiogram with color flow and   Doppler was performed.    Left Ventricle    Ejection Fraction = <25%.    The transmitral spectral Doppler flow pattern is suggestive of   impaired LV relaxation.    There is moderate to severe global hypokinesis of the left ventricle.    The left ventricle is mildly dilated.    There is normal left ventricular wall thickness.    Left ventricular systolic function is severely reduced.    Right Ventricle    The right ventricle is normal size.    The right ventricular systolic function is moderately reduced.    Atria    The left atrium is mildly dilated.    Right atrial size is normal.    There is no Doppler evidence for an atrial septal defect.    Mitral Valve    The mitral valve leaflets appear normal. There is no evidence of   stenosis, fluttering, or prolapse.    There is mild to moderate mitral regurgitation.    There is no mitral valve stenosis.    Tricuspid Valve    The tricuspid valve is normal.    There is mild tricuspid regurgitation.    Right ventricular systolic pressure is elevated at 50-660mg.    Aortic Valve    The aortic valve opens well.  No aortic regurgitation is present.    No hemodynamically significant valvular aortic stenosis.    Pulmonic Valve    The pulmonic valve is not well seen, but is grossly normal.    Mild pulmonic valvular regurgitation.    Great Vessels    The aortic root is normal size.    The pulmonary is not well visualized.    Pericardium/Pleural    There is no pleural effusion.    No pericardial effusion.    MMode 2D Measurements and Calculations    RVDd: 3.7 cm    IVSd: 1.4 cm    LVIDd: 5.5 cm    LVIDs: 4.8 cm    LVPWd: 1.4 cm    FS: 13 %    EF(Teich): 28 %    Ao root diam: 3.0 cm    ACS: 1.5 cm    LA dimension: 4.5 cm    LVOT diam: 2.1 cm    Doppler Measurements and Calculations    MV E point: 157  cm/sec    MV A point: 62 cm/sec    MV E/A: 2.5     MV V2 max: 152 cm/sec    MV max PG: 9.0 mmHg    MV V2 mean: 69 cm/sec    MV mean PG: 2.5 mmHg    MV V2 VTI: 40 cm    MV P1/2t max vel: 144 cm/sec    MV P1/2t:58 msec    MVA(P1/2t): 3.8 cm2    MV dec slope: 733 cm/sec2    MV dec time: 0.22 sec    Ao V2 max: 164 cm/sec    Ao max PG: 10 mmHg    Ao V2 mean: 103 cm/sec    Ao mean PG: 4.9 mmHg    Ao V2 VTI: 34 cm    AVA(I,D): 1.7 cm2    AVA(V,D): 1.9 cm2    AI max vel: 219 cm/sec    AI max PG: 19 mmHg    AI dec slope: 160 cm/sec2    AI P1/2t: 401 msec    LV max PG: 3.0 mmHg    LV mean PG: 1.2 mmHg    LV V1 max: 88 cm/sec    LV V1 mean: 49 cm/sec    LV V1 VTI: 17 cm    MR max vel: 564 cm/sec    MR max PG: 127 mmHg    MR mean vel: 406 cm/sec    MR mean PG: 76 mmHg    MR VTI: 187 cm    SV(LVOT): 59 ml    PA V2 max: 105 cm/sec    PA max PG: 4.0 mmHg    PA acc time: 0.06 sec    PI end-d vel: 79 cm/sec    TR Max vel: 315 cm/sec    TR Max PG: 39 mmHg    RVSP: 44 mmHg    RAP systole: 5.0 mmHg    PA pr(Accel): 52 mmHg    Reading Physician: Ida Rogue   Sonographer: Jaci Standard  Interpreting Physician:  Ida Rogue,  electronically signed on   11-23-2011 08:07:22  Requesting Physician: Ida Rogue    Ampicillin: N/V/Diarrhea  Vital Signs/Nurse's Notes: **Vital Signs.:   19-Feb-13 17:08   Vital Signs Type Routine   Temperature Temperature (F) 98   Celsius 36.6   Temperature Source oral   Pulse Pulse 79   Pulse source per Dinamap   Respirations Respirations 18   Systolic BP Systolic BP 546   Diastolic BP (mmHg) Diastolic BP (  mmHg) 66   Mean BP 91   BP Source Dinamap   Pulse Ox % Pulse Ox % 96   Pulse Ox Activity Level  At rest   Oxygen Delivery Room Air/ 21 %     Impression 74 year old male with a history of poorly controlled diabetes,  coronary artery disease, bypass, peripheral vascular disease with stenting to the iliac arteries, long smoking  history with underlying COPD, echocardiogram showing systolic dysfunction with ejection fraction less than 25%, presenting with shortness of breath.   No prior echocardiogram is available at this time from [facility]. Uncertain if his systolic dysfunction is new or old. Shortness of breath has been over the past several weeks and likely secondary to acute systolic CHF exacerbation. No symptoms concerning for bronchitis. He started to feel better on Lasix IV. --would continue gentle diuresis with Lasix daily or b.i.d..  --if creatinine continues to trend downward with diuresis, could accelerate pace of diuresis to b.i.d. --we'll hold off on ACE inhibitor until renal function is more clear. --Would continue beta blockers, change to Coreg 12.5 mg b.i.d. --he will need daily Lasix and education. He is drinking large amounts of Pepsi in his room.  2)  Renal insufficiency uncertain if this is secondary to long-standing poorly controlled diabetes. Could be secondary to poor cardiac output.  If renal function improves with diuresis, this would be secondary to poor cardiac output and poor renal perfusion.   3) CAD/ bypass He may benefit from outpatient stress test, pharmacologic Myoview. He does not currently have symptoms of chest pain or angina.  Continue aspirin, statin  4) diabetes Hemoglobin A1c greater than 10. Likely secondary to medication noncompliance He may benefit from establishing with local primary care physician   Electronic Signatures: Ida Rogue (MD)  (Signed 19-Feb-13 19:42)  Authored: General Aspect/Present Illness, History and Physical Exam, Review of System, Past Medical History, Health Issues, Home Medications, Labs, EKG , Radiology, Allergies, Vital Signs/Nurse's Notes, Impression/Plan   Last Updated: 19-Feb-13 19:42 by Ida Rogue (MD)

## 2015-01-26 NOTE — Discharge Summary (Signed)
PATIENT NAME:  Patrick [name], Dontrey A MR#:  [id number] DATE OF BIRTH:  [birth year]  DATE OF ADMISSION:  02/07/2012 DATE OF DISCHARGE:  02/09/2012  DISCHARGE DIAGNOSES:  1. Acute on chronic systolic heart failure with ejection fraction of less than 25% on echo. The patient is on Lasix and ACE inhibitor. Metoprolol held secondary to bradycardia. Also on aspirin and statin.  2. Acute on chronic renal failure. Creatinine close to baseline with a value of 1.6 on discharge.  3. Non-ST-elevation myocardial infarction with history of coronary artery disease. Troponin peaked at 2. On aspirin and statin. Cannot use beta blocker secondary to bradycardia. Stress test seemed okay and cardiology in agreement for conservative management for now. 4. Sinus bradycardia with heart rate in the 40s and 50s. Cannot provide beta blocker at this time.   SECONDARY DIAGNOSES:  1. History of congestive heart failure with ejection fraction less than 25%.  2. Diabetes. 3. Hypertension.  4. Hyperlipidemia.  5. Gastroesophageal reflux disease.  6. Chronic back pain. 7. Neuropathy. 8. History of cerebrovascular accident.  9. Coronary artery disease.   CONSULTANT: Julien Nordmannimothy Gollan, MD - Cardiology.  PROCEDURES/RADIOLOGY: Stress test on 02/09/2012 by Dr. Mariah MillingGollan showed an ejection fraction mildly depressed with a value of 45%. Wall motion abnormality in the inferior and apical region. Possible ischemia in the basal septal walls, otherwise large regional scar in the inferior and apical wall.   Chest x-ray on 02/07/2012 showed mild interstitial edema.   MAJOR LABORATORY PANEL: Urinalysis on admission was negative   HISTORY AND SHORT HOSPITAL COURSE: The patient is a 74 year old male with the above-mentioned medical problems who was admitted for acute on chronic systolic heart failure with an ejection fraction less than 25%. The patient was started on IV Lasix. Cardiology consultation was obtained with Dr. Mariah MillingGollan. The patient also had  non-ST-elevation myocardial infarction with troponin peak at 2.05. After discussion with family, as the patient did not have much symptoms, the decision was made to just do stress test to risk stratify, as per cardiology, and was done with the results as dictated above. Medical management was recommended. He also had some acute on chronic renal failure with creatinine baseline of 1.6 and went to 1.9 on 02/08/2012 and is back to baseline. The patient's breathing is much better. He seemed to be at baseline. He also had significant bradycardia with heart rate dropping in the 40s and 50s for which his metoprolol was held. After discussion with cardiology, the patient is being discharged home as he seemed to be doing much better and is agreeable the discharge plan.   PHYSICAL EXAMINATION:   VITALS: On The date of discharge, his temperature was 98, heart rate 57 per minute, respirations 20 per minute, blood pressure 120/76 mmHg, and he is saturating 94% on room air.   CARDIOVASCULAR: S1 and S2 normal. No murmurs, rubs, or gallops.   LUNGS: Clear to auscultation bilaterally. No wheezing, rales, rhonchi, or crepitation.   ABDOMEN: Soft and benign.   NEUROLOGIC: Nonfocal examination. The remainder of physical examination remained at baseline.  DISCHARGE MEDICATIONS:  1. Nexium 40 mg p.o. daily.  2. Acetaminophen/oxycodone 1 tablet p.o. every six hours as needed. 3. Oxycodone 60 mg p.o. twice a day as needed.  4. Tylenol 500 mg 2 tablets p.o. daily as needed.  5. Simvastatin 40 mg p.o. daily.  6. Nitroglycerin 0.4 mg sublingual as needed.  7. Ferrous sulfate 325 mg p.o. daily.  8. Citalopram 20 mg p.o. daily.  9. NovoLog sliding scale  insulin as per his home dose.  10. NovoLog 10 units subcutaneous two times a day with each meal.  11. Insulin Lantus 30 units subcutaneous at bedtime.  12. Atrovent 2 puffs inhaled four times daily as needed.  13. Nexium 40 mg p.o. daily.  14. Lisinopril 2.5 mg p.o.  daily. 15. Gabapentin 300 mg p.o. daily.  16. Aspirin 81 mg p.o. daily.  17. Lasix 20 mg p.o. daily and 80 mg p.o. daily as needed.   DISCHARGE DIET: Low sodium, 1800 ADA, low cholesterol.  DISCHARGE ACTIVITY: As tolerated.   DISCHARGE INSTRUCTIONS AND FOLLOW-UP: The patient was instructed to follow up with his primary care physician, Dr. Duncan Dull, in one week. He will need follow-up with Dr. [name] from cardiology in 1 to 2 weeks. He was instructed to have a basic metabolic panel check in one week with the results forwarded to his primary care physician for renal function monitoring and evaluation. The patient did have some difficulty controlling his blood sugar and required an insulin drip while in the hospital, but was switched back to his home regimen.   TOTAL TIME SPENT DISCHARGING PATIENT: 55 minutes. ____________________________ Ellamae Sia. Sherryll Burger, MD vss:slb D: 02/09/2012 17:51:16 ET T: 02/10/2012 12:30:34 ET JOB#: [id number]  cc: Clemence Lengyel S. Sherryll Burger, MD, <Dictator> Duncan Dull, MD Antonieta Iba, MD Ellamae Sia [facility] MD ELECTRONICALLY SIGNED 02/10/2012 16:52

## 2015-01-26 NOTE — Consult Note (Signed)
PATIENT NAME:  Patrick [name], Patrick [name] MR#:  [id number] DATE OF BIRTH:  [birth year]  DATE OF CONSULTATION:  11/26/2011  REFERRING PHYSICIAN:   CONSULTING PHYSICIAN:  Lutricia Feil, MD/Dawn Mort Sawyers, NP  PRIMARY CARE PROVIDER: Duncan Dull, MD   CARDIOLOGIST: Julien Nordmann, MD   REASON FOR CONSULTATION: Anemia, trending hemoglobin and hematocrit.    HISTORY OF PRESENT ILLNESS: Patrick [name] is a 74 year old Caucasian gentleman who presented to [facility] Emergency Room on 11/22/2011 for the concern of shortness of breath and weakness as well as edema bilaterally to lower extremities, a cough which has been present but nonproductive, and generalized weakness. He states he initially thought that he had the flu. He required the use of his MDI more frequently. He does have a history of congestive heart failure as well as coronary artery disease. According to the H and P on admission, they were thinking about discharging the patient home. When they walked him around his pulse oximetry dropped into the 80's with ambulation. Hospitalist service was contacted for further evaluation. The patient states that he has not had any chest pain. No nausea. He has a known history of gastric ulcer disease previously evaluated at [facility] Last year he had a colonoscopy as well as an upper endoscopy done, do not have a copy of the report to review but the patient states that they were unremarkable. Vomited two weeks ago but denies hematemesis. Stool this morning was black according to patient as well as nursing staff.   The patient states that Dr. Darrick Huntsman started him back on iron supplementation a couple of weeks ago because his hemoglobin and hematocrit had dropped. He has a known history of iron deficiency anemia as well. Bowel pattern habit is every 2 to 3 days. The patient has over the past week been using BC powders one every two days for hip pain. No reflux, water brash. No early satiety. No dysphagia. The patient was sitting on  the side of the bed upon presenting to his room. Denies appetite. No complaint of shortness of breath.   PAST MEDICAL HISTORY: 1. Congestive heart failure.  2. Diabetes.  3. Hypertension.  4. Hyperlipidemia.  5. Gastroesophageal reflux disease. 6. Chronic back pain. 7. Neuropathy. 8. CVA. 9. Coronary artery disease.   PAST SURGICAL HISTORY:  1. CABG. 2. Cholecystectomy.   ALLERGIES: Penicillin.   MEDICATIONS:   1. Percocet 5/325 one tablet every six hours as needed.  2. Lasix 40 mg a day, which he has recently doubled up on for the past couple of weeks due to edema. 3. Metoprolol 12.5 mg twice a day. 4. Lisinopril 2.5 mg daily, which was started last month.  5. Ferrous sulfate 325 mg a day. 6. Lantus 40 units in the morning sub-Q.  7. NovoLog 20 units q. a.c.  8. OxyContin 60 mg twice a day. 9. Gabapentin 300 mg t.i.d.  10. Albuterol inhaler as needed.  11. Nexium 40 mg a day. 12. Celexa 20 mg. 13. Zocor 40 mg daily.   SOCIAL HISTORY: Smokes a pack of cigarettes a day. No alcohol. No drug use. Worked previously as a Nutritional therapist. Resides alone.   FAMILY HISTORY: Father deceased at the age of 16 from myocardial infarction. Mother deceased at the age of 58 from a CVA. No history of colon cancer.   REVIEW OF SYSTEMS: CONSTITUTIONAL: Significant for chills. Significant for weakness. Weight has been stable. Very fatigued. EYES: Does wear glasses. Otherwise, no complaints of double vision, blurred vision. ENT: Significant  for hearing loss. Significant for runny nose. No sore throat. CARDIOVASCULAR: No chest pain. No heart palpitations. RESPIRATORY: Significant for shortness of breath, nonproductive cough. No hemoptysis. GI: See history of present illness. GU: No dysuria, hematuria. MUSCULOSKELETAL: Significant for chronic back pain, joint pain. INTEGUMENTARY: No rashes. No lesions. NEUROLOGICAL: No history of seizure disorder. Significant for CVA. No recent dizziness or syncopal episodes.  PSYCH: Significant for anxiety. No depression. ENDOCRINE: No thyroid problems. HEMATOLOGIC/LYMPHATIC: No anemia. Denies easy bruising and bleeding.   PHYSICAL EXAMINATION:   VITAL SIGNS: Temperature 97.2, pulse 73, respirations 18, blood pressure 99/57, pulse oximetry 96%.   GENERAL: Well developed, well nourished 74 year old Caucasian gentleman who appears older than stated age, appears fatigued. No evidence of respiratory distress.   HEENT: Normocephalic, atraumatic. Pupils equal, reactive to light. Conjunctivae pale. Sclerae anicteric.    NECK: Supple. Trachea midline. No lymphadenopathy or thyromegaly.   PULMONARY: Decreased breath sounds. Scattered rhonchi.   CARDIOVASCULAR: Regular rate and rhythm, S1, S2. No murmurs. No gallops.   ABDOMEN: Soft, nondistended. Bowel sounds in four quadrants. No bruits. No masses.   RECTAL: Deferred.   MUSCULOSKELETAL: Moving all four extremities. No contractures. No clubbing.   EXTREMITIES: +1 edema. Compression hose in place.   PSYCH: Alert and oriented x4. Significant for hard of hearing.   NEUROLOGICAL: No gross neurological deficits.   LABORATORY, DIAGNOSTIC, AND RADIOLOGICAL DATA: Chemistry panel on admission revealed glucose of 206, BUN was 35, creatinine was 1.75. Comparison today's date glucose 124, BUN 70, creatinine is 2.33. Glucose has remained elevated during admission with today being noted the greatest improvement by fingerstick of 113. Ferritin level is 38. Hemoglobin A1c is 10.2. HDL is low at 29. Hepatic panel AST is low at 13, otherwise within normal limits. Troponin 0.02 on admission; second and third in the series revealed to be less than 0.02. On admission RBC was 3.63, hemoglobin is 9.4, hematocrit 29.5. Today hemoglobin dropped to 8.5 with hematocrit of 26.9. Retic count is 2.5 with absolute retic count of 0.092. Vitamin B12 level is 378. Occult blood feces is positive. Normal sinus rhythm on EKG. Oxygen sat is 91% on room  air. Chest, PA and lateral, changes of pulmonary vascular congestion and interstitial edema suspicious for early manifestation of congestive heart failure. Ultrasound kidney bilateral no hydronephrosis or acute changes identified. Observed a few renal cysts bilaterally.   IMPRESSION:  1. Anemia. 2. Melena. Continued trend in hemoglobin and hematocrit. Fecal occult blood positive. 3. Acute renal failure.  4. Known history of congestive heart failure with evidence of mild manifestation of congestive heart failure on chest x-ray.   PLAN: The patient's presentation was discussed with Dr. Lutricia Feil. Recommendation is to proceed today with an EGD to allow direct luminal evaluation of upper GI tract for concern for possible ulcer disease or hemorrhagic gastritis given the fact that the patient has been using BC powders over the past week. Procedure as well as risks versus benefits discussed with the patient. Recommend continued serial monitoring of hemoglobin and hematocrit. Continue PPI therapy and transfuse as necessary.   At 1 p.m. today the patient notified staff that he wishes not to proceed forward with an EGD today. Recommend continue monitoring of hemoglobin and hematocrit as stated above and will continue to follow and monitor the patient's status during his hospitalization.   These services provided by Rodman Key, NP under collaborative agreement with Lutricia Feil, MD.  ____________________________ Rodman Key, NP dsh:drc D: 11/26/2011 13:39:45 ET T: 11/26/2011  14:02:40 ET JOB#: [id number]  cc: Rodman Keyawn S. Harrison, NP, <Dictator> Rodman KeyAWN S HARRISON MD ELECTRONICALLY SIGNED 11/26/2011 17:06

## 2015-01-26 NOTE — Consult Note (Signed)
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 that was reportedly "okay ", peripheral vascular disease with history of intervention to the legs in June 2011, history of GI bleed in 2011 requiring PRBC x4 who presented to [facility] in mid February 2013 with shortness of breath presenting over several weeks, admitted with acute on chronic systolic CHF,  poorly controlled diabetes. Hemoglobin A1c was 10. presenting with chest pain.  He reports acute onset of chest pain starting this AM lasting one hour. 8/10. Pain resolved once he arrived in the ER.   Echocardiogram at that time showed severely depressed LV function, EF less than 95%, diastolic dysfunction, moderately elevated right ventricular systolic pressures   During his hospital course, heart failure regimen was started with low-dose ACE, beta blocker, diuretic with improvement of his symptoms.    ACE inhibitor has later held  secondary to renal insufficiency. Last seen in clinic and was doing well 12/17/11.   Physical Exam:   GEN well developed, well nourished, thin    HEENT red conjunctivae    NECK supple  No masses     RESP normal resp effort     CARD Regular rate and rhythm  Murmur     Murmur Systolic     ABD denies tenderness  soft     LYMPH negative neck    EXTR negative edema    SKIN normal to palpation    PSYCH alert, A+O to time, place, person, good insight   Review of Systems:   Subjective/Chief Complaint chest oain, now resolved, weight gain    General: No Complaints     Skin: No Complaints     ENT: No Complaints     Eyes: No Complaints     Neck: No Complaints     Respiratory: No Complaints     Cardiovascular: Chest pain or discomfort     Gastrointestinal: No Complaints     Genitourinary: No Complaints     Vascular: No Complaints      Musculoskeletal: No Complaints     Neurologic: No Complaints     Hematologic: No Complaints     Endocrine: No Complaints     Psychiatric: No Complaints     Review of Systems: All other systems were reviewed and found to be negative     Medications/Allergies Reviewed Medications/Allergies reviewed      COPD:    CVA/Stroke:    myocardial Infarction:    diabetic:    hypertension:    Leg Surgery - Right:    Leg Surgery - Left:    gallbladder removed:    4 way bypass surgery:   Home Medications: Medication Instructions Status  Nexium 40 mg oral delayed release capsule 1 tab(s) orally once a day  Active  acetaminophen-oxycodone 325 mg-5 mg oral tablet 1 tab(s) orally every 6 hours as needed   Active  oxycodone 60 mg oral tablet, extended release 1 tab(s) orally 2 times a day as needed   Active  gabapentin 300 mg oral capsule 1  orally 3 times a day  Active  acetaminophen 500 mg oral tablet 2  orally prn  Active  simvastatin 40 mg oral tablet 1  orally once a day  Active  nitroglycerin 0.4 mg sublingual tablet 1  sublingual prn  Active  ferrous sulfate 325 mg (65 mg elemental iron)  oral tablet 1 tab(s) orally once a day Active  citalopram 20 mg oral tablet 1 tab(s) orally once a day Active  NovoLog  subcutaneous before meals and at bedtime.  0 units if blood sugar 0-150, two units if blood sugar is 151-200, four units if blood sugar is 201-250, six units if blood sugar is 251-300, eight units if blood sugar is 301-350, ten units if blood sugar is 351-400.  call MD if blood sugar is greater than 400.   Active  NovoLog 10 unit(s) subcutaneous 3 times a day BEFORE MEALS  Active  Lantus 30 unit(s) subcutaneous once a day (at bedtime) Active  Lasix 40 mg oral tablet 1 tab(s) orally once a day Active  Atrovent HFA 17 mcg/inh inhalation aerosol 2 puff(s) inhaled 4 times a day, As Needed Active  Nexium 40 mg oral delayed release capsule 1 cap(s) orally once a day Active   lisinopril 2.5 mg oral tablet 1 tab(s) orally once a day Active  metoprolol tartrate 25 mg oral tablet 0.5 tab(s) orally 2 times a day Active     Routine Hem:  06-May-13 05:39    WBC (CBC) 7.3   RBC (CBC) 4.11   Hemoglobin (CBC) 11.7   Hematocrit (CBC) 36.5   Platelet Count (CBC) 223   MCV 89   MCH 28.4   MCHC 32.0   RDW 18.6  Routine Chem:  06-May-13 05:39    Glucose, Serum 357   BUN 37   Creatinine (comp) 1.64   Sodium, Serum 141   Potassium, Serum 5.1   Chloride, Serum 105   CO2, Serum 29   Calcium (Total), Serum 9.0   Anion Gap 7   Osmolality (calc) 304   eGFR (African American) 49   eGFR (Non-African American) 42  Cardiac:  06-May-13 05:39    Troponin I < 0.02   CK, Total 113   CPK-MB, Serum 6.1  Routine UA:  06-May-13 05:39    Color (UA) STRAW   Clarity (UA) CLEAR   Bilirubin (UA) NEGATIVE   Ketones (UA) NEGATIVE   Specific Gravity (UA) 1.011   Blood (UA) NEGATIVE   pH (UA) 6.0   Nitrite (UA) NEGATIVE   Leukocyte Esterase (UA) NEGATIVE   RBC (UA) 0-5   Bacteria (UA) NEGATIVE  Routine Coag:  06-May-13 05:39    D-Dimer, Quantitative 0.95  Routine Chem:  06-May-13 05:39    B-Type Natriuretic Peptide [facility]) 2680  Cardiac:  06-May-13 14:24    Troponin I 1.90   CPK-MB, Serum 13.8   EKG:   Interpretation EKG  EKG shows sinus tachycardia, rate 116, LBBB, nonsopcific ST ABN   Radiology Results: XRay:    06-May-13 06:30, Chest Portable Single View   Chest Portable Single View    REASON FOR EXAM:    chest pain, sob  COMMENTS:       PROCEDURE: DXR - DXR PORTABLE CHEST SINGLE VIEW  - Feb 07 2012  6:30AM     RESULT: Comparison: 11/22/2011    Findings:  Cardiomegaly and mediastinum are similar to prior. Prior median   sternotomy. There are mild bilateral interstitial opacities, similar to   prior. The central pulmonary vasculature is prominent, similar to prior.    IMPRESSION:   Findings suggesting mild interstitial edema, similar to  prior.      Verified By: Gregor Hams, M.D., MD    Ampicillin: N/V/Diarrhea  Vital Signs/Nurse's Notes:  **Vital Signs.:   06-May-13 16:10   Vital Signs Type POCT  Pulse Pulse 78   Systolic BP Systolic BP 150   Diastolic BP (mmHg) Diastolic BP (mmHg) 74   Mean BP 103   Nurse Fingerstick (mg/dL) FSBS (fasting range 65-99 mg/dL) 232   Comments/Interventions  Per Hyperglycemia Protocol ([facility]U); Nurse Notified     Impression Patrick [name] is a 74 year old with a long history of smoking, COPD,  coronary artery disease, MI in 2000 with CABG at that time, followup cardiac catheterization several years ago at [facility] that was reportedly "okay ", peripheral vascular disease with history of intervention to the legs in June 2011, history of GI bleed in 2011 requiring PRBC x4 who presented to [facility] in mid February 2013 with shortness of breath presenting over several weeks, admitted with acute on chronic systolic CHF,  poorly controlled diabetes. Hemoglobin A1c was 10. presenting with chest pain.  1) Chest pain/NSTEMI pain now resolved. He is not interested in cardiac cath He prefers a stress test (lexiscan) Will order this for tomorrow to risk stratify, determine if cardiac cath indicated. Continue heparin, asa, metoprolol, (ace on hold), statin  2) ischemic cardiomyopathy he reports 5 pound weight gain no sob \Could continue gentlr diuresis, hold for rise in creatinine  3) Diabetes: poorly controlled. take insulin only after sugars are elevated. Needs education  4) Smoking/COPD still smoking on  nicotine patch  5) CAD, CABG: refusing cardiac cath as above Conrtinue medical management, stress myoview   Electronic Signatures: Ida Rogue (MD)  (Signed 06-May-13 18:50)  Authored: General Aspect/Present Illness, History and Physical Exam, Review of System, Past Medical History, Home Medications, Labs, EKG , Radiology, Allergies, Vital Signs/Nurse's Notes,  Impression/Plan   Last Updated: 06-May-13 18:50 by Ida Rogue (MD)

## 2015-01-26 NOTE — Consult Note (Signed)
Chief Complaint:   Subjective/Chief Complaint See Dawn Harrison's notes from yest. No abd pain or nausea. Some dark stool. Hgb similar to yest AM's labs. Pt still does not want to repeat EGD. Breathing ok.   VITAL SIGNS/ANCILLARY NOTES: **Vital Signs.:   23-Feb-13 07:20   Vital Signs Type POCT   Nurse Fingerstick (mg/dL) FSBS (fasting range 65-99 mg/dL) 101   Comments/Interventions  Nurse Notified   Brief Assessment:   Cardiac Regular    Respiratory clear BS    Gastrointestinal Normal   Routine Hem:  23-Feb-13 06:00    WBC (CBC) 7.7   RBC (CBC) 3.36   Hemoglobin (CBC) 8.7   Hematocrit (CBC) 27.4   Platelet Count (CBC) 214   MCV 82   MCH 25.8   MCHC 31.7   RDW 17.5  Routine Chem:  23-Feb-13 06:00    Glucose, Serum 113   BUN 63   Creatinine (comp) 1.86   Sodium, Serum 142   Potassium, Serum 4.5   Chloride, Serum 106   CO2, Serum 29   Calcium (Total), Serum 8.4   Osmolality (calc) 302   eGFR (African American) 47   eGFR (Non-African American) 39   Anion Gap 7  Routine Hem:  23-Feb-13 06:00    Neutrophil % 71.4   Lymphocyte % 19.7   Monocyte % 7.6   Eosinophil % 1.2   Basophil % 0.1   Neutrophil # 5.5   Lymphocyte # 1.5   Monocyte # 0.6   Eosinophil # 0.1   Basophil # 0.0   Assessment/Plan:  Assessment/Plan:   Assessment UGI bleed from [facility] powder?    Plan Continue PPI daily. If hgb drifts downward, then can increase PPI to bid qac. Pt needs to avoid NSAIDS/BC powder when discharged. Since patient does not want endoscopies, will sign off. Thanks.   Electronic Signatures: Verdie Shire (MD)  (Signed [phone] 08:51)  Authored: Chief Complaint, VITAL SIGNS/ANCILLARY NOTES, Brief Assessment, Lab Results, Assessment/Plan   Last Updated: 23-Feb-13 08:51 by Verdie Shire (MD)

## 2015-01-26 NOTE — Discharge Summary (Signed)
PATIENT NAME:  Patrick [name], Mikle A MR#:  [id number] DATE OF BIRTH:  1941/03/27  DATE OF ADMISSION:  11/22/2011 DATE OF DISCHARGE:  11/29/2011  For additional details, please see the interim discharge summary done by Dr. Chales AbrahamsGupta on 11/28/2011.   DIAGNOSES:  1. Acute hypoxic respiratory failure secondary to acute congestive heart failure and chronic obstructive pulmonary disease exacerbation. 2. Acute congestive heart failure exacerbation, ejection fraction 25%, and supraventricular tachycardia, resolved. 3. Chronic obstructive pulmonary disease exacerbation. 4. Acute renal failure due to ATN versus hypovolemia. 5. History of coronary artery disease with left bundle branch block.  6. Hypertension.  7. Diabetes.  8. Back pain.  9. Anemia.  10. Hyperlipidemia.  11. Anxiety.   DISPOSITION: The patient is being discharged home with home health and physical therapy.   DIET: Low sodium, ADA diet.   ACTIVITY: As tolerated.  FOLLOW-UP: Follow up with PCP, Dr. Darrick Huntsmanullo, and cardiologist, Dr. Mariah MillingGollan, in 1 to 2 weeks after discharge.   DISCHARGE MEDICATIONS:  1. Nexium 40 mg daily.  2. Tylenol/oxycodone 325/5 one tablet every six hours p.r.n.  3. Oxycodone extended-release 60 mg b.i.d. p.r.n. as needed.  4. Neurontin 300 mg t.i.d.  5. Tylenol 500 mg 2 tablets daily p.r.n.  6. Simvastatin 40 mg daily.  7. Nitroglycerin 0.4 mg sublingual p.r.n.  8. Ferrous sulfate 325 mg daily. 9. Citalopram 20 mg daily.  10. Spiriva 18 mcg inhaled daily.  11. NovoLog insulin sliding scale as prescribed.  12. Albuterol MDI 2 puffs q.4 to 6 hours p.r.n. shortness of breath or wheezing.  13. Coreg 12.5 mg b.i.d.  14. NovoLog 10 units subcutaneously t.i.d. a.c.  15. Lantus 30 units subcutaneously at bedtime.  16. DuoNebs q.6 hours p.r.n. shortness of breath. 17. Lasix 20 mg q. 48 hours.   RESULTS: Occult blood feces positive. Vitamin B12 normal 378. Oxygen saturation 91%. Echo showed mildly dilated LV, left  ventricular systolic function is severely reduced, EF less than 25%, moderate to severe global hypokinesis of left ventricular. The right ventricular systolic function is moderately reduced. The left atrium is mildly dilated. There is mild to moderate mitral regurgitation. Right ventricular systolic pressure is elevated at 50 to 60 mmHg. Bilateral kidney ultrasound showed no hydronephrosis. Chest x-ray there are changes of pulmonary vascular congestion and interstitial edema. Normal white count. Hemoglobin ranging from 9.4 to 8.6. Normal platelet count. Creatinine ranging from 1.75 to 1.22. LDL 75. Hemoglobin A1c 10.2. Cardiac enzymes normal.   HOSPITAL COURSE: For additional details, please see interim discharge summary done by Dr. Chales AbrahamsGupta covering the hospital course until February 24th. During the rest of the hospitalization, the patient remained hemodynamically stable. He was no longer hypoxic. He had good saturations on room air. He had no further evidence of COPD or systolic CHF exacerbation. He had no events on telemetry during the last 24 hours. His kidney function had essentially normalized. His diabetes also remained well controlled. The patient has been advised to hold his aspirin in view of guaiac positive stools and anemia. He will also take Lasix every 48 hours because overdiuresis resulted in further kidney function deterioration while in the hospital. The patient is being discharged home in a stable condition.   TIME SPENT: 45 minutes.  ____________________________ Darrick MeigsSangeeta Nicolae Vasek, MD sp:drc D: 11/29/2011 16:10:58 ET T: 11/30/2011 12:28:50 ET JOB#: [id number]  cc: Darrick MeigsSangeeta Demoni Parmar, MD, <Dictator> Duncan Dulleresa Tullo, MD Antonieta Ibaimothy J. Gollan, MD Darrick MeigsSANGEETA Jesly Hartmann MD ELECTRONICALLY SIGNED 12/01/2011 14:11

## 2015-01-26 NOTE — Consult Note (Signed)
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 Hemoglobin A1c was 10, presenting with Chest pain. Cardiology was consulted for chest pain.   Previous Echocardiogram showed severely depressed and weak function of less than 10%, diastolic dysfunction, moderately elevated right ventricular systolic pressures  During his previous hospital course 11/2011, heart failure regimen was started with low-dose ACE, beta blocker, diuretic with improvement of his symptoms. Recently seen in clinic 03/21/2012 and reported  feeling well with no shortness of breath. He has not been monitor his weight closely and reports he is 163 pounds at home with no clothes. His blood pressure has been elevated at home with recordings in the 150-160 range. ACE inhibitor has been held as of discharge secondary to renal insufficiency. Coreg dose was decreased as he was reporting low heart rate and was missing doses.  He had recent cataract surgery. Recent admission 02/2012 for similar sx of chest pain, low risk stress test seen. he is a poor historian this Am (sleepy). Per the notes, appeared anxious, elevated BP, possible chest pain 30 minutes. No further chest pain since admission per the patient. Cardiac enz x 3 are negative.    Present Illness . Pharmacological myocardial perfusion imaging study with a very small  region of ischemia noted in the basal septal wall. There is a large  region of old scar in the inferior wall and  apical region.  Ejection  fraction estimated at 45%. Left ventricle is at least  mildly dilated if  not moderately dilated.  No significant EKG changes concerning for  ischemia. No symptoms reported with Lexiscan infusion. Overall,  a  low risk scan.   Physical Exam:   GEN well developed, well nourished, no acute distress, thin    HEENT red conjunctivae    NECK supple  No masses    RESP decreased BS B/l throughtout    CARD Regular rate and rhythm  Murmur    Murmur Systolic    Systolic Murmur Out flow    ABD denies tenderness  soft    LYMPH negative neck    EXTR negative edema    SKIN normal to palpation    NEURO motor/sensory function intact    PSYCH lethargic   Review of Systems:   Subjective/Chief Complaint no complaints, tired     Anxiety:    Depression:    COPD:    CVA/Stroke:    myocardial Infarction:    diabetic:    hypertension:    Leg Surgery - Right:    Leg Surgery - Left:    gallbladder removed:    4 way bypass surgery:        Admit Reason:   Chest pain: (786.50) 05-Apr-2012, Active, ICD9, Unspecified chest pain  Home Medications: Medication Instructions Status  simvastatin 40 mg oral tablet 1  orally once a day  Active  nitroglycerin 0.4 mg sublingual tablet 1  sublingual prn  Active  ferrous sulfate 325 mg (65 mg elemental iron) oral tablet 1 tab(s) orally once a day Active  citalopram 20 mg oral tablet 1 tab(s) orally once a day Active  NovoLog  subcutaneous before meals and at bedtime.  0 units if blood sugar 0-150, two units if blood sugar is 151-200, four units if blood sugar is 201-250, six units if blood sugar is 251-300, eight units if blood sugar is 301-350, ten units if blood sugar is 351-400.  call MD if blood sugar is greater than 400.   Active  NovoLog 10 unit(s) subcutaneous 3 times a day BEFORE MEALS  Active  Lantus 30 unit(s) subcutaneous once a day (at bedtime) Active  Atrovent HFA 17 mcg/inh inhalation aerosol 2  puff(s) inhaled 4 times a day, As Needed Active  lorazepam 0.5 mg oral tablet 1 tab(s) orally 2 times a day Active  carvedilol 12.5 mg oral tablet 1 tab(s) orally 2 times a day Active  ondansetron 4 mg oral tablet 1 tab(s) orally every 8 hours, As Needed- for Nausea, Vomiting  Active  Lasix 20 mg oral tablet 1 tab(s) orally 2 times a day Active  gabapentin 300 mg oral capsule 1  orally 3 times a day Active  docusate sodium 100 mg oral capsule 1 cap(s) orally 2 times a day, As Needed- for Constipation  Active  furosemide 40 mg oral tablet 1 tab(s) orally once a day Active  dimenhyDRINATE 50 mg oral tablet 1 tab(s) orally every 6 hours, As Needed- for Nausea, Vomiting  Active  esomeprazole 40 mg oral delayed release capsule 1 cap(s) orally once a day Active  aspirin 81 mg oral tablet 1 tab(s) orally once a day Active  Ventolin  orally  Active  Dulera 200 mcg-5 mcg/inh inhalation aerosol 2 puff(s) inhaled 2 times a day Active  Durezol 0.05% ophthalmic emulsion 1 drop(s) to each affected eye 4 times a day Active  isosorbide mononitrate 30 mg oral tablet, extended release 1 tab(s) orally once a day (in the morning) Active  OxyContin  orally  Active  Percocet 5/325 oral tablet tab(s) orally  Active   Lab Results:  Routine Chem:  02-Jul-13 21:48    Glucose, Serum  297   BUN  28   Creatinine (comp)  1.70   Sodium, Serum 140   Potassium, Serum 4.5   Chloride, Serum 107   CO2, Serum 28   Calcium (Total), Serum 8.6   Anion Gap  5   Osmolality (calc) 296   eGFR (African American)  47   eGFR (Non-African American)  40 (eGFR values <6m/min/1.73 m2 may be an indication of chronic kidney disease (CKD). Calculated eGFR is useful in patients with stable renal function. The eGFR calculation will not be reliable in acutely ill patients when serum creatinine is changing rapidly. It is not useful in  patients on dialysis. The eGFR calculation may not be applicable to patients at the low and high  extremes of body sizes, pregnant women, and vegetarians.)  Cardiac:  02-Jul-13 21:48    Troponin I < 0.02 (0.00-0.05 0.05 ng/mL or less: NEGATIVE  Repeat testing in 3-6 hrs  if clinically indicated. >0.05 ng/mL: POTENTIAL  MYOCARDIAL INJURY. Repeat  testing in 3-6 hrs if  clinically indicated. NOTE: An increase or decrease  of 30% or more on serial  testing suggests a  clinically important change)   CK, Total 120   CPK-MB, Serum  6.1 (Result(s) reported on 04 Apr 2012 at 10:35PM.)  03-Jul-13 06:36    Troponin I 0.02 (0.00-0.05 0.05 ng/mL or less: NEGATIVE  Repeat testing in 3-6 hrs  if clinically indicated. >0.05 ng/mL: POTENTIAL  MYOCARDIAL INJURY. Repeat  testing in 3-6 hrs if  clinically indicated. NOTE: An increase or decrease  of 30% or more on serial  testing suggests a  clinically important change)  Routine Hem:  02-Jul-13 21:48    WBC (CBC) 8.1   RBC (CBC)  3.40   Hemoglobin (CBC)  9.2   Hematocrit (CBC)  29.3   Platelet Count (CBC) 334 (Result(s) reported on 04 Apr 2012 at 10:26PM.)   MCV 86   MCH 27.0   MCHC  31.4   RDW  15.4   EKG:   Interpretation EKG today shows normal sinus rhythm with rate 65 beats per minute with interventricular conduction delay, nonspecific ST and T wave abnormality in lead V5, V6, 2, 3, aVF    Ampicillin: N/V/Diarrhea  Vital Signs/Nurse's Notes: **Vital Signs.:   03-Jul-13 05:55   Vital Signs Type Admission   Temperature Temperature (F) 98.3   Celsius 36.8   Temperature Source Oral   Pulse Pulse 86   Respirations Respirations 22   Systolic BP Systolic BP 657   Diastolic BP (mmHg) Diastolic BP (mmHg) 76   Mean BP 107   Pulse Ox % Pulse Ox % 96   Pulse Ox Activity Level  At rest   Oxygen Delivery Room Air/ 21 %     Impression 74 year old with a long history of smoking, COPD, coronary artery disease, 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 Hemoglobin A1c was 9, presenting with Chest pain.    Plan . 1) Chest pain: Cardiac enz negative, no significant EKG changes, currently with no sx. Suspect he is having periodic angina given severe underlying CAD Unable to exclude anxiety (given previous hx). Take ativan. recent low risk stress test not a candidate for cardiac cath given renal dysfunction (continued poor diabetes control, chronic HTN, medication noncompliance) --Would recommend continued medical management, ASA, low dose coreg 6.25 BID (he is concerned about bradycardia and was missing doses), could add low dose ace inhibitor --No further testing at this time --If ambulating could d/c home later today Needs to take NTG SL for chest pain --Would double the isosrbide to 30 mg BID Consider adding amlodipine 5 mg for HTN --If pain episodes continue, could start ranexa (he can call office) --Follow up in clinic in 1 week [place] cardiology  2) CAD, AUTOLOGOUS BYPASS GRAFT -  No further workup at this time. Continue current medication regimen as above    3) Diabetes mellitus type 2 with complications, uncontrolled -   We have stressed to him the importance of diet restraint and strict sugar control.  HBA1C >9 in feb 2013  4) Peripheral vascular disease -  Continue aggressive cholesterol management, goal LDL less than 70. At goal on current meds  5) TOBACCO ABUSE - 1PPD Long history of smoking. Encouraged continued smoking cessation.  6) HYPERLIPIDEMIA-MIXED -  Cholesterol is at goal on the current lipid regimen. No changes to the medications were made.  7) COPD: on inhalers, appears stable  clinically   Electronic Signatures: Ida Rogue (MD)  (Signed 03-Jul-13 09:45)  Authored: General  Aspect/Present Illness, History and Physical Exam, Review of System, Past Medical History, Health Issues, Home Medications, Labs, EKG , Allergies, Vital Signs/Nurse's Notes, Impression/Plan   Last Updated: 03-Jul-13 09:45 by Ida Rogue (MD)

## 2015-01-26 NOTE — H&P (Signed)
PATIENT NAME:  Patrick [name], Patrick [name] MR#:  [id number] DATE OF BIRTH:  1941-08-20  DATE OF ADMISSION:  04/05/2012  PRIMARY CARE PHYSICIAN: Duncan Dull, MD  CARDIOLOGIST: Julien Nordmann, MD  CHIEF COMPLAINT: Chest pain, dizziness, elevated blood pressure, and anxiety.   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 During his last admission, in May of this year, he had stress test which showed possible ischemia in the basal septal wall. At that time, the patient was treated medically and if he had recurrent chest pain is to consider cardiac catheterization, if the patient is agreeable. The patient was doing reasonably well until the last few days he was noticed by his daughter that he is nervous, pacing the floor, and worried about his blood pressure. Yesterday he developed chest pain in the evening while watching TV. The location was midsternal area, sharp in nature, and severity was 8 on a scale of 10. It lasted about half an hour after which he continued to complain that he is dizzy and his blood pressure is elevated. Finally his daughter brought him to the Emergency Department for evaluation. His initial troponin was normal although his CK-MB fraction was slightly elevated. The patient was admitted for 24-hour observation and to follow up on his cardiac enzymes.   REVIEW OF SYSTEMS: CONSTITUTIONAL: No fever. No chills. No fatigue. EYES: No blurring of vision. No double vision. ENT: No hearing impairment. No sore throat. No dysphagia but reports dizziness in association with his elevated blood pressure. CARDIOVASCULAR:  He reports chest pain as above. No shortness of breath. No syncope. No edema. RESPIRATORY: Reports chest pain. No cough. No sputum production. No shortness of breath. GASTROINTESTINAL: No  abdominal pain. No vomiting. No diarrhea. GENITOURINARY: No dysuria. No frequency of urination. MUSCULOSKELETAL: No joint swelling or pain. No muscular pain or swelling. INTEGUMENTARY: No skin rash. No ulcers. NEUROLOGY: No focal weakness. No seizure activity. No headache. PSYCHIATRY: He has anxiety and depression as well. ENDOCRINE: No heat or cold intolerance. No polyuria or polydipsia.     PAST MEDICAL HISTORY:  1. History of coronary artery disease status post coronary artery bypass graft in the year 2000. His last stress test was in May of this year, evaluated by Dr. Julien Nordmann, and again the stress test showed a small region of possible ischemia, in the basal septal wall. Otherwise large region of scar in the inferior and apical wall. Ejection fraction mildly depressed at 45%. Wall motion abnormality in the inferior and apical region.  2. Systemic hypertension.  3. Diabetes mellitus. 4. Hyperlipidemia.  5. Chronic back pain. 6. Gastroesophageal reflux disease.  7. Peripheral neuropathy.  8. History of stroke.   PAST SURGICAL HISTORY:  1. Coronary artery bypass graft in 2000. 2. Cholecystectomy.   SOCIAL HABITS: Chronic smoker. He continues to smoke one pack per day despite his underlying medical problems. No history of alcohol or other drug abuse.   SOCIAL HISTORY: He is widowed and lives at home alone. He is a retired Nutritional therapist.   FAMILY HISTORY: His father suffered a heart attack at age 79. Mother died at age 72 from stroke.   ADMISSION MEDICATIONS:  1. Aspirin 81 mg a day.  2. Carvedilol (Coreg) 12.5 mg twice a day.  3. Citalopram 20 mg a day. 4. Colace 100 mg twice a day. 5. Nexium  40 mg once a day.  6. Ferrous sulfate (iron sulfate) 325 mg once a day. 7. Lasix 40 mg once a day. 8. Gabapentin 300 mg three times daily. 9. NovoLog. The patient is not clear about his medications or doses. He states 10 units then he said 30 units three times daily.  10. Lantus either 30 or  40 units once at night.  11. Atrovent two puffs four times daily p.r.n. 12. Isosorbide mononitrate 30 mg once a day. 13. Ativan (lorazepam) 0.5 mg twice a day. 14. Nitroglycerin sublingual p.r.n.  15. Zofran p.r.n. for vomiting and nausea.  16. Simvastatin 40 mg at bedtime.  17. Percocet 5/325 mg p.r.n.  18. OxyContin possibly 60 mg once a day p.r.n.   ALLERGIES: Penicillin.   PHYSICAL EXAMINATION:   VITAL SIGNS: Blood pressure 139/66, respiratory rate 14, pulse 82, temperature 98.6, and oxygen saturation 96%.   GENERAL APPEARANCE: Elderly male lying down in bed, in no acute distress, closing his eyes and does not participate in conversation, only if he is stimulated or pressured by the question. His answers are very short.   HEAD/NECK: No pallor. No icterus. No cyanosis.   EARS, NOSE, AND THROAT: Hearing was normal. Nasal mucosa, lips, and tongue were normal.   EYES: Normal iris and conjunctivae. Pupils are about 4 mm, equal, and reactive to light.   NECK: Supple. Trachea at midline. No thyromegaly. No cervical lymphadenopathy. No masses.   HEART: Normal S1 and S2. No S3 or S4. No murmur. No gallop. No carotid bruits.   RESPIRATORY: Normal breathing pattern without use of accessory muscles. No rales. No wheezing.   ABDOMEN: Soft without tenderness. No hepatosplenomegaly. No masses. No hernias.   SKIN: No ulcers. No subcutaneous nodules.   MUSCULOSKELETAL: No joint swelling. No clubbing.   NEUROLOGIC: Cranial nerves II through XII are intact. No focal motor deficits.   PSYCHIATRY: The patient is alert and oriented to place and people. Mood and affect were flat. He is not interested in answering questions.   LABORATORY AND DIAGNOSTICS: EKG showed normal sinus rhythm at rate of 83 per minute. Old inferior myocardial infarction. Nonspecific widening of QRS or incomplete left bundle branch block. Otherwise unremarkable EKG.  Serum glucose 297, BUN 28, creatinine 1.7 and his  baseline creatinine is 1.4 to 1.6, sodium 140, and potassium 4.5. CPK 120 and 160. Troponin, two samples showed less than 0.02. CBC showed white count of 8000, hemoglobin 9.2, hematocrit 29, and platelet count 334.   ASSESSMENT: A 74 year old admitted with the following for observation.  1. Chest pain.  2. Dizziness and anxiety.  3. History of coronary disease status post coronary artery bypass graft in 2000 with latest stress test in May of this year showing mild area of ischemia.  4. History of congestive cardiomyopathy with ejection fraction of 45% as evident by the nuclear study last month, two months ago in May.  5. His other medical problems are as listed above to include diabetes mellitus type 2, hypertension, hyperlipidemia, chronic back pain, history of stroke, and peripheral neuropathy.   PLAN: Admit to the medical floor on telemetry monitoring. Follow-up on the cardiac enzymes. Given his anxiety about his chest pain and blood pressure, I will consult his cardiologist, Dr. Mariah MillingGollan, for further input about the next step. This was discussed before with the patient that the next step is cardiac catheterization. I will continue current aspirin with beta blocker and the nitrate as listed above. The patient was advised to quit smoking.  I will provide a nicotine patch while he is here. The patient does not have a Living Will. I spoke with the daughter. She has the power of attorney to make decisions. Her name is Joyce Gross.          TIME SPENT: In evaluation this patient and reviewing medical records took more than one hour.  ____________________________ Carney Corners. Rudene Re, MD amd:slb D: 04/05/2012 04:57:03 ET T: 04/05/2012 08:14:52 ET JOB#: [id number]  cc: Carney Corners. Rudene Re, MD, <Dictator> Duncan Dull, MD Karolee Ohs Dala Dock MD ELECTRONICALLY SIGNED 04/08/2012 22:12

## 2015-01-26 NOTE — Op Note (Signed)
PATIENT NAME:  Patrick [name], Patrick [name] MR#:  [id number] DATE OF BIRTH:  [birth year]  DATE OF PROCEDURE:  03/14/2012  PREOPERATIVE DIAGNOSIS: Visually significant cataract of the left eye.   POSTOPERATIVE DIAGNOSIS: Visually significant cataract of the left eye.   OPERATIVE PROCEDURE: Cataract extraction by phacoemulsification with implant of intraocular lens to left eye.   SURGEON: Galen ManilaWilliam Quincy Prisco, MD.   ANESTHESIA:  1. Managed anesthesia care.  2. Topical tetracaine drops followed by 2% Xylocaine jelly applied in the preoperative holding area.   COMPLICATIONS: None.   TECHNIQUE:  Stop and chop.   DESCRIPTION OF PROCEDURE: The patient was examined and consented in the preoperative holding area where the aforementioned topical anesthesia was applied to the left eye and then brought back to the Operating Room where the left eye was prepped and draped in the usual sterile ophthalmic fashion and [name] lid speculum was placed. [name] paracentesis was created with the side port blade and the anterior chamber was filled with viscoelastic. [name] near clear corneal incision was performed with the steel keratome. [name] continuous curvilinear capsulorrhexis was performed with [name] cystotome followed by the capsulorrhexis forceps. Hydrodissection and hydrodelineation were carried out with BSS on [name] blunt cannula. The lens was removed in [name] stop and chop technique and the remaining cortical material was removed with the irrigation-aspiration handpiece. The capsular bag was inflated with viscoelastic and the Technis ZCB00 24.0-diopter lens, serial number [id number] was placed in the capsular bag without complication. The remaining viscoelastic was removed from the eye with the irrigation-aspiration handpiece. The wounds were hydrated. The anterior chamber was flushed with Miostat and the eye was inflated to physiologic pressure. The wounds were found to be water tight. The eye was dressed with Vigamox. The patient was given protective glasses  to wear throughout the day and [name] shield with which to sleep tonight. The patient was also given drops with which to begin [name] drop regimen today and will follow-up with me in one day.   ____________________________ Jerilee FieldWilliam L. Matisyn Cabeza, MD wlp:cms D: 03/14/2012 12:35:31 ET T: 03/14/2012 13:12:27 ET JOB#: [id number]  cc: Nawal Burling L. Ashlynn Gunnels, MD, <Dictator> Jerilee FieldWILLIAM L Deyanna Mctier MD ELECTRONICALLY SIGNED 03/16/2012 12:12

## 2015-01-26 NOTE — Consult Note (Signed)
Details:    - Patient has decided not to proceed forward with EGD today.  Procedure cancelled.  If patient should change his mind please notify us.  Will continue to monitor.   Electronic Signatures: Rodman KeyHarrison, Cleatis Fandrich S (NP)  (Signed [id number]-Feb-13 13:28)  Authored: Details   Last Updated: 22-Feb-13 13:28 by Rodman KeyHarrison, Yostin Malacara S (NP)

## 2015-01-26 NOTE — Discharge Summary (Signed)
PATIENT NAME:  Patrick Patrick [name], Patrick Patrick [name] MR#:  [id number] DATE OF BIRTH:  Jun 04, 1941  DATE OF ADMISSION:  04/05/2012 DATE OF DISCHARGE:  04/05/2012  ADMITTING DIAGNOSES:  1. Chest pain. 2. Dizziness and anxiety.  3. History of coronary artery disease. 4. 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  5. History of diabetes mellitus. 6. Hypertension.  7. Hyperlipidemia.  8. Chronic back pain.  9. History of stroke and peripheral neuropathy.   DISCHARGE DIAGNOSES: 1. Chest pain, possibly anxiety versus unstable angina related.  2. Negative cardiac enzymes at this time, unclear etiology.  3. History of known coronary artery disease.  4. Cardiomyopathy, ejection fraction of 45%.  5. History of hypertension.  6. Renal failure, chronic.  7. Anemia.  8. Diabetes mellitus.  9. Hyperlipidemia. 10. Anxiety. 11. Gastroesophageal reflux disease. 12. History of cerebrovascular accident.  13. Peripheral neuropathy. 14. Peripheral vascular disease.   DISCHARGE CONDITION: Stable.   DISCHARGE MEDICATIONS:  1. Simvastatin 40 mg p.o. daily.  2. Nitroglycerin 0.4 mg sublingually as needed.  3. Iron sulfate 325 milligrams p.o. once daily.  4. Citalopram 10 mg p.o. daily.  5. NovoLog sliding scale.  6. NovoLog 10 units 3 times daily before meals.  7. Lantus 30 units subcutaneously at bedtime.  8. Atrovent 2 puffs 4 times daily as needed.  9. Lorazepam 0.5 mg twice daily.  10. Carvedilol 12.5 mg p.o. twice daily.  11. Zofran 4 mg p.o. every eight hours as needed.  12. Lasix 20 mg p.o. twice daily.  13. Gabapentin 300 mg p.o. three times daily.  14. Docusate sodium 100 mg p.o. twice daily as needed.  15. Furosemide 40 mg p.o. once daily.  16. Dimenhydrinate 50 mg p.o. every six hours as needed.  17. Esomeprazole 40 mg p.o. once daily.  18. Aspirin 81 mg p.o. daily.  19. Ventolin as needed.  20. Dulera 200/5, two puffs twice daily.  21. Durezol 0.05% ophthalmic solution one drop  to each affected eye 4 times daily.  22. Isosorbide mononitrate. This is changed to 60 mg p.o. daily dose.  23. OxyContin, unknown frequency, as previously prescribed.  24. Percocet 5/325 milligrams, unknown frequency.   ADDITIONAL MEDICATIONS: Lisinopril, Norvasc and Imdur are new medications.  1. Lisinopril 2.5 mg p.o. daily.  2. Norvasc 2.5 mg p.o. daily.  3. Imdur as mentioned above. Dosing has increased to 60 mg p.o. daily dose.   DIET: 2 gram salt, 1,800 ADA, low fat, low cholesterol.   ACTIVITY LIMITATIONS: As tolerated.   FOLLOW-UP: Follow-up appointment with Dr. Mariah MillingGollan in two days after discharge as well as Dr. Darrick Huntsmanullo in two days after discharge.   CONSULTANT: Dr. Mariah MillingGollan.   RADIOLOGIC STUDIES: Chest x-ray results is not performed.   HISTORY: The patient is Patrick [name] 74 year old Caucasian male with past medical history significant for history of coronary artery disease, history of cardiomyopathy with ejection fraction of 45%, who presented to the hospital with complaints of chest pain. Please refer to Dr. Riley Nearingarwish's admission note on 04/05/2012. Apparently during his last admission in May of 2013, he had stress test which showed possible ischemia of basal septal wall. At that time, the patient was advised to have medical therapy and if he had recurrent chest pain to consider cardiac catheterization and he was doing relatively well until Patrick [name] few days ago when he was noted to be somewhat nervous, pacing floors. He was worried about his blood pressure as well as heart rate. He developed also chest pain while watching TV.  He became somewhat dizzy and had elevated blood pressure and was brought to the Emergency Room for further evaluation. His MB fraction was slightly elevated; however, his troponin was within normal limits. He was admitted to the hospital.   PHYSICAL EXAMINATION:  VITAL SIGNS: Initially on arrival to the Emergency Room, his blood pressure was 139/66, respiration rate 14, pulse 82,  temperature 96.6 and oxygen saturation was 96%. EKG showed normal sinus rhythm at 83 beats per minute, old inferior wall myocardial infarction, nonspecific widening of QRS, incomplete left bundle branch block, otherwise, no acute ST-T changes were noted.   LABORATORY, RADIOLOGICAL AND DIAGNOSTIC DATA: Lab data done on 04/04/2012 showed glucose 297, BUN and creatinine were 28 and 1.70. Otherwise BMP was unremarkable. The patient's cardiac enzymes as mentioned above showed normal CK total as well as troponin levels. However, CK-MB fraction was slightly elevated at 6.1 on the first set, 5.6 on the second set. However, troponin still remained stable at less than 0.02 on all three sets. The patient's white blood cell count was normal at 8.1, hemoglobin low at 9.1 and platelet count of 334.   HOSPITAL COURSE: The patient was admitted to the hospital. His cardiac enzymes were cycled and cardiology consultation with Dr. [name] was obtained. Dr. [name] saw the patient in consultation the same day, 04/05/2012. He felt that the patient had pharmacological myocardial perfusion imaging study which showed Patrick [name] very small region of ischemia noted in basal septal wall and large region of old scar in inferior wall as well as apical region. Ejection fraction estimated at 45%. Left ventricle was mildly dilated if not moderately dilated, but no significant EKG changes concerning for ischemia were noted and no symptoms were reported with Lexiscan infusion. Overall it was felt to be low risk scan. This time EKG on admission showed normal sinus rhythm at rate of 65 beats per minute with intraventricular conduction delay. Nonspecific ST-T T wave abnormality in leads V5, V6, II-III and aVF.   In regards to chest pains, Dr. [name] felt that the patient's cardiac enzymes were negative. He did not have any significant EKG changes and had no symptoms afterwards. He felt that the patient very likely has periodic angina given severe underlying  coronary artery disease. However, he was not able to exclude also anxiety given previous history of anxiety and the patient taking Ativan. Because the patient had recent low-risk stress test and because the patient is not Patrick [name] candidate for cardiac catheterization given renal dysfunction here and continued poor diabetes control as well as chronic hypertension and medication noncompliance, he recommended to continue medical therapy and medical management and continue aspirin as well as Coreg at 6.25 mg twice daily dose as the patient was concerned about bradycardia and was missing some doses. He also recommended to add low dose of ACE inhibitor and possibly also amlodipine and increase isosorbide. For this reason, the patient was added low dose of ACE inhibitor, lisinopril at 2.5 mg dose, also Amlodipine at 2.5 mg dose which needs to be increased if the patient's blood pressure tolerates. Also, doubled isosorbide to 60 mg p.o. once daily dose. The patient was ambulated and he had no chest pain and felt that he was stable to be discharged home. He is to follow up with his primary care physician, Dr. Darrick Huntsman, as well as cardiologist, Dr. [name], in the next few days after discharge.   In regards to tobacco abuse, the patient continued smoking. He has long history of smoking and encouraged continued  smoking cessation.   For hyperlipidemia, cholesterol was at goal with current lipid management. He recommended no changes in medication management.    For chronic obstructive pulmonary disease, he recommend to continue inhalers and the patient was clinically stable.   For peripheral vascular disease, recommended to continue aggressive cholesterol management and the patient's LDL was noted to be less than 70. The patient's lipid panel was checked in the recent past on 02/08/2012. At that time, the patient's LDL was 74.   In regards to coronary artery disease , autologous  bypass  graft, Dr. [name] did not recommend any  further work-up at this time. Continue on current medications as mentioned above.   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 The patient was stressed to have 1,800 ADA diet restriction control. The patient's hemoglobin A1c was checked in February 2013. At that time, the patient's hemoglobin A1c was more than 9. The patient is to follow up with his primary care physician and have hemoglobin A1c checked in her office. The patient is being discharged in stable condition with above-mentioned medications and follow-up.   His vital signs on the day of discharge revealed temperature of 98, pulse 56, respiratory rate was 20, blood pressure 135/52, saturation was 93% on room air at rest.   TIME SPENT: 40 minutes.  ____________________________ Katharina Caper, MD rv:ap D: 04/05/2012 19:14:57 ET T: 04/07/2012 11:24:22 ET JOB#: [id number] cc: Katharina Caper, MD, <Dictator> Duncan Dull, MD Antonieta Iba, MD Breanne Olvera MD ELECTRONICALLY SIGNED 04/23/2012 18:06

## 2015-01-26 NOTE — H&P (Signed)
PATIENT NAME:  Patrick Hill CellarBOONE, Alexiz A MR#:  [id number] DATE OF BIRTH:  08-26-1941  DATE OF ADMISSION:  02/07/2012  PRIMARY CARE PHYSICIAN: Dr. Darrick Huntsmanullo  REQUESTING PHYSICIAN: Dr. Jens SomWiegand  CHIEF COMPLAINT: Chest pain and shortness of breath.   HISTORY OF PRESENT ILLNESS: Patient is a 74 year old male with known history of ischemic cardiomyopathy, coronary artery disease, chronic obstructive pulmonary disease is being admitted for suspected congestive heart failure exacerbation. Patient was having trouble breathing since last Thursday when he took 80 mg of Lasix which has been prescribed to take it as needed. Was feeling okay on Friday but started having trouble breathing more on Saturday, took another 80 mg of Lasix, was okay rest of Saturday, started having more trouble breathing last night, woke up early this morning around 4:00 with chest pain on the left side radiating to the left arm. Pain was sharp in nature, associated with shortness of breath. He was brought into the Emergency Department. While in the ED he was tachycardic with a heart rate of 116 per minute and his blood pressure was 195/91 mmHg. Patient also reports checking his blood pressure at home which he said was 181/90 this morning. He also reports weight gain of about 4 pounds in last one day. He is being admitted for further evaluation and management. While in the ER his sugars have been running anywhere from 300 to 480s.    He denies any chest pain at this time when I evaluated him and looks comfortable. He has difficulty hearing and wears hearing aid.   PAST MEDICAL HISTORY: 1. History of congestive heart failure with ejection fraction less than 25% and diastolic dysfunction. 2. Diabetes. 3. Hypertension. 4. Hyperlipidemia.  5. Gastroesophageal reflux disease.  6. Chronic back pain. 7. Neuropathy. 8. Cerebrovascular accident.  9. Coronary artery disease.   PAST SURGICAL HISTORY:  1. Coronary artery bypass  graft. 2. Cholecystectomy.  ALLERGIES: Penicillin.    SOCIAL HISTORY: Smokes 1 pack of cigarettes daily for a long time. No alcohol. No drug use. He worked as a Nutritional therapistplumber. He lives alone.   FAMILY HISTORY: Father died at age of 72 due to myocardial infarction. Mother died at the age of 72 due to cerebrovascular accident.    MEDICATIONS AT HOME:  1. Tylenol 500 mg 2 tablets p.o. daily as needed.  2. Percocet 325/5, 1 tablet p.o. every six hours as needed.  3. Atrovent 2 puffs inhaled 4 times a day as needed. 4. Citalopram 20 mg p.o. daily.  5. Iron sulfate 325 mg p.o. daily.  6. Gabapentin 300 mg p.o. 3 times a day.  7. Insulin Lantus 30 units subcutaneous at bedtime. 8. Lasix 20 mg p.o. daily and 80 mg p.o. daily as needed.  9. Lisinopril 2.5 mg p.o. daily.  10. Metoprolol 25 mg p.o. half tablet b.i.d.  11. Nexium 40 mg p.o. daily.  12. Nitroglycerin 0.4 mg sublingual as needed.  13. Insulin NovoLog subcutaneous t.i.d. with each meal 10 units.  14. Oxycodone 60 mg p.o. b.i.d. as needed.  15. OxyContin 60 mg p.o. daily as needed.  16. Simvastatin 40 mg p.o. daily.   REVIEW OF SYSTEMS: CONSTITUTIONAL: No fever. Positive fatigue and weakness. EYES: No blurry or double vision. ENT: Decreased hearing, wears hearing aid. RESPIRATORY: Positive for shortness of breath also at rest. No sputum. No hemoptysis. CARDIOVASCULAR: Positive for chest pain and dyspnea on exertion. No palpitations. GASTROINTESTINAL: No nausea, vomiting, diarrhea. GENITOURINARY: No dysuria, hematuria. ENDOCRINE: No polyuria, nocturia. HEMATOLOGY: Anemia of chronic disease.  SKIN: No rash or lesion. MUSCULOSKELETAL: Positive for chronic back pain and joint pain. NEUROLOGIC: No tingling, numbness, weakness. PSYCHIATRIC: No history of depression. Positive anxiety.   PHYSICAL EXAMINATION:  VITAL SIGNS: Temperature 95.9, heart rate 80 per minute, respirations 20 per minute, blood pressure 122/61 mmHg. He is saturating 97% on 2  liters oxygen via nasal cannula. While in the ER he was saturating 95% on room air, blood pressure as 195/91 in triage with respiratory rate of 24 per minute and heart of 160 per minute.   GENERAL: Patient is a 73 year old male lying in the bed in no acute distress.   EYES: Pupils equal, round, reactive to light, accommodation. No scleral icterus. Extraocular muscles are intact.   HENT: Head atraumatic, normocephalic. Oropharynx and nasopharynx clear.   NECK: Supple. No jugular venous distention. No thyroid enlargement or tenderness.   LUNGS: Decreased breath sounds at the bases bilaterally. Occasional rales. No rhonchi.   CARDIOVASCULAR: S1, S2 normal. Systolic ejection murmur present.   ABDOMEN: Soft, nontender, nondistended. Bowel sounds present. No organomegaly or mass.   EXTREMITIES: 1+ pedal edema. No cyanosis, clubbing.   NEUROLOGIC: Nonfocal examination. Cranial nerves II through XII intact. Muscle strength 5/5 in extremities. Sensation intact.   PSYCHIATRIC: Patient is alert and oriented to time, place and person x3.   SKIN: No obvious rash, lesion, ulcer.   LABORATORY, DIAGNOSTIC AND RADIOLOGICAL DATA: Normal BMP except BUN of 37, creatinine 1.64, blood glucose 357. BNP 2680. Troponin first set was negative, second set is showing troponin 1.90. CBC within normal limits except hemoglobin of 11.7, hematocrit 36.5. D-dimer 0.95. Urinalysis was negative.   Chest x-ray while in the ED shows mild interstitial edema.   EKG showed no major ST changes.   IMPRESSION AND PLAN:  1. Possible congestive heart failure exacerbation. Will start him on IV Lasix twice a day 40 mg. Consult cardiology. Obtain daily weights, strict ins and outs. He has gained 4 pounds in the last one day as per the patient. Cannot give ACE inhibitor secondary to his acute renal failure. Will start him on beta blocker and statin.  2. Acute renal failure, likely prerenal. This could get worse with diuresis. Will  avoid nephrotoxins. Monitor him closely. 3. Uncontrolled diabetes with sugar running anywhere from 300 to 400. He is not ketotic. Will start him insulin drip to get sugars under better control. Will watch him in the Intensive Care Unit. 4. Possible myocardial infarction with troponin trending up, peak at 1.9 the second set. Start him on aspirin, nitroglycerin, beta blocker and statin. This could be due to supply/demand ischemia although cannot rule out myocardial infarction at this time. Will let cardiology decide on full dose anticoagulation. Cardiology consult has been requested. 5. CODE STATUS: FULL CODE.   TOTAL TIME TAKING CARE OF THIS PATIENT: 55 minutes. (critical care).   Patient remains critically sick with possible MI, congestive heart failure along with renal failure and uncontrolled blood sugar. Will monitor in the Critical Care Unit until sugar gets under control. After cardiology evaluates he can be transferred to regular floor for close monitoring.   ____________________________ Ezekiel Menzer S. Sherryll Burger, MD vss:cms D: 02/07/2012 16:53:17 ET T: 02/07/2012 17:29:14 ET JOB#: [id number]  cc: Ruby Logiudice S. Sherryll Burger, MD, <Dictator> Duncan Dull, MD Antonieta Iba, MD Ellamae Sia [facility] MD ELECTRONICALLY SIGNED 02/08/2012 16:59

## 2015-01-26 NOTE — Consult Note (Signed)
Brief Consult Note: Diagnosis: Late entry.  Patient seen 11/26/2011 at 9:15 am. Anemia and melena.  Acute respiratory failure.  COPD.  CHF.  Acute renal failure.   Consult note dictated.   Orders entered.   Discussed with Attending MD.   Comments: Patient's presentation discussed with Dr. Lutricia FeilPaul Oh.  Recommendation is to proceed with EGD today to allow direct visualization of upper GI tract.  Concern for possible ulcer disease given his history of ulcer disease as well as past week of use of BC powders.  Procedure discussed with patient who voiced understanding and in agreement.  NPO status. Recommend continued PPI therapy.  Serial monitoring of H/H and transfuse as necessary.  Electronic Signatures: Rodman KeyHarrison, Dawn S (NP)  (Signed [phone]-Feb-13 13:26)  Authored: Brief Consult Note   Last Updated: 22-Feb-13 13:26 by Rodman KeyHarrison, Dawn S (NP)

## 2015-06-27 IMAGING — CR DG CHEST 1V PORT
1 series · 1 of 1 positions shown · non-contrast
Comparison: Portable exam 3991 hr compared to 06/23/2014

CLINICAL DATA: Large RIGHT pleural effusion post thoracentesis

EXAM:
PORTABLE CHEST - 1 VIEW

[portable]
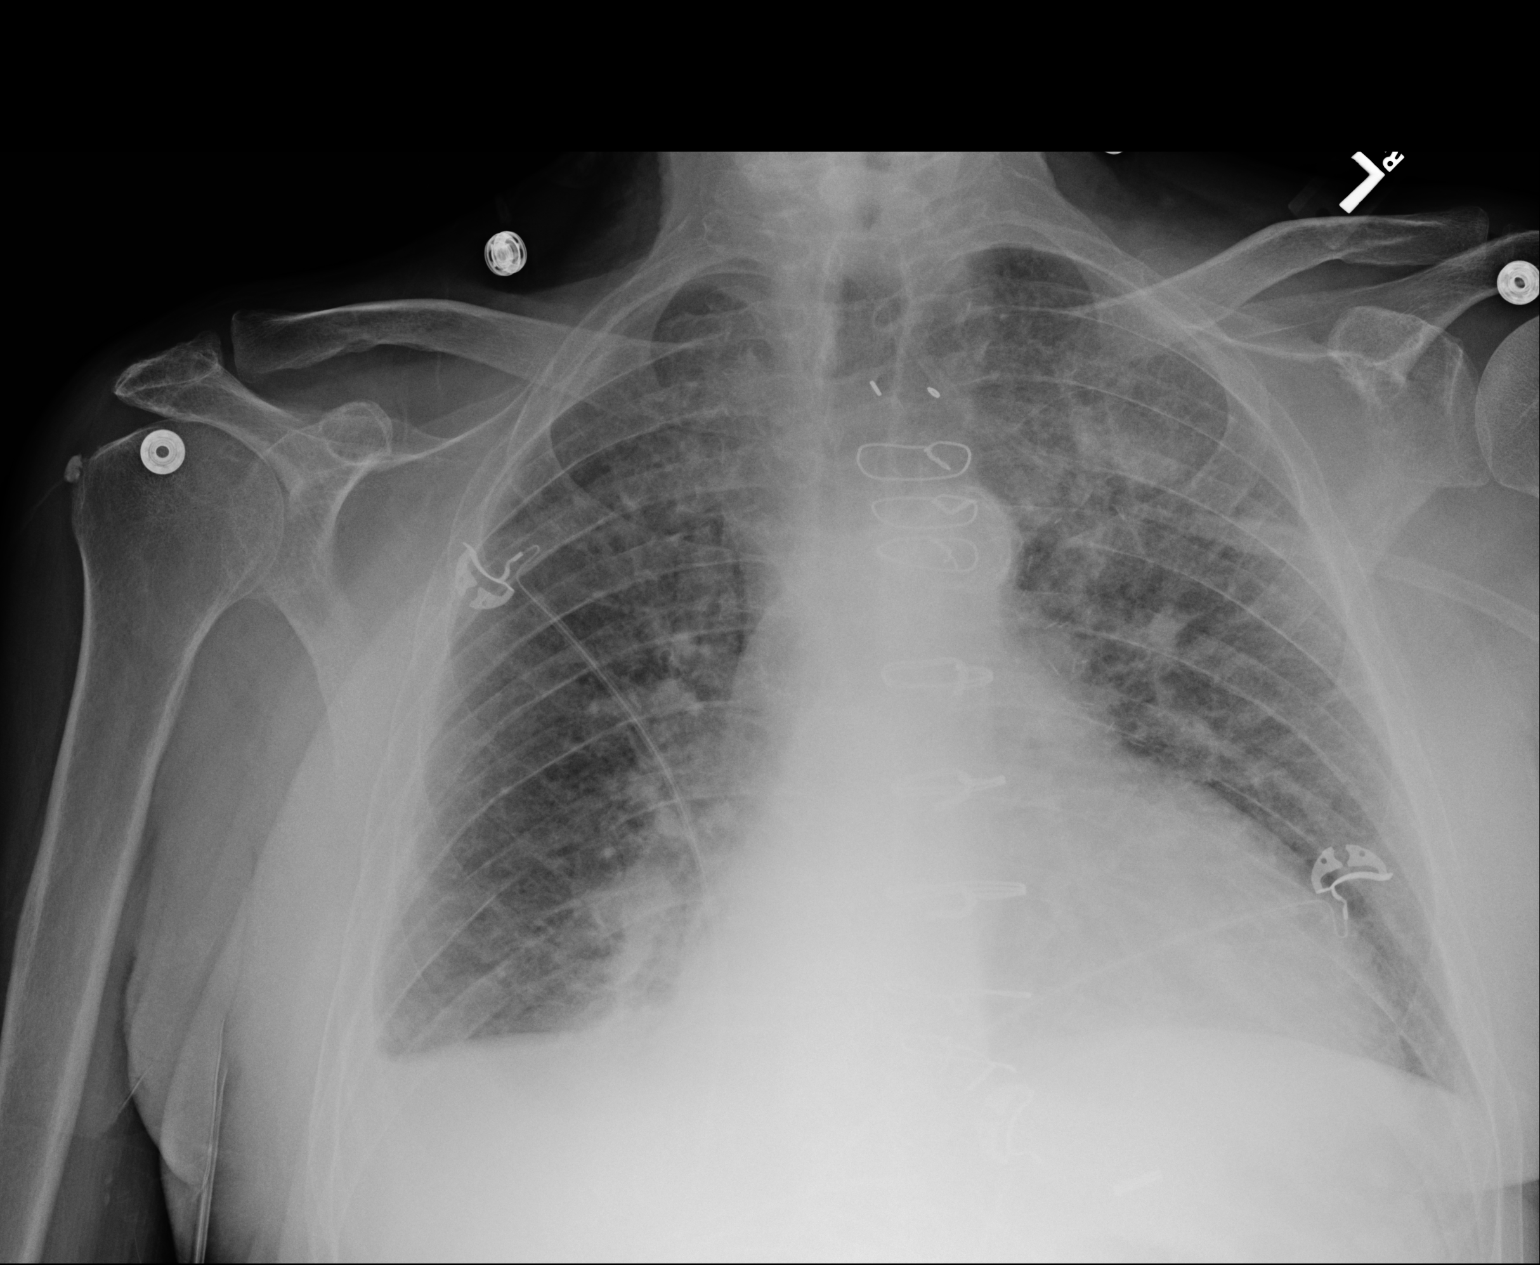

[1 of 1 positions shown; findings below may reference images not displayed]

FINDINGS: Enlargement of cardiac silhouette with pulmonary vascular
congestion.

Postoperative changes of median sternotomy.

Atherosclerotic calcification aorta.

Markedly decreased RIGHT pleural effusion post thoracentesis and
removal of 1744 mL of fluid from the RIGHT chest.

No pneumothorax.

Decreased RIGHT basilar atelectasis.

Improved pulmonary edema in LEFT upper lobe.

Bones demineralized.
IMPRESSION: Markedly decreased RIGHT pleural effusion and atelectasis post
thoracentesis without pneumothorax.

Improved pulmonary edema.

## 2017-06-29 NOTE — Telephone Encounter (Signed)
Error

## 2017-07-04 NOTE — Telephone Encounter (Signed)
Error
